# Patient Record
Sex: Female | Born: 1935 | Race: White | Hispanic: No | Smoking: Never smoker
Health system: Southern US, Community
[De-identification: ages and names within clinical notes are randomized; demographics above are authoritative.]

## PROBLEM LIST (undated history)

## (undated) DIAGNOSIS — M810 Age-related osteoporosis without current pathological fracture: Secondary | ICD-10-CM

## (undated) DIAGNOSIS — R1903 Right lower quadrant abdominal swelling, mass and lump: Secondary | ICD-10-CM

## (undated) DIAGNOSIS — I10 Essential (primary) hypertension: Secondary | ICD-10-CM

## (undated) DIAGNOSIS — D649 Anemia, unspecified: Secondary | ICD-10-CM

## (undated) DIAGNOSIS — B029 Zoster without complications: Secondary | ICD-10-CM

## (undated) DIAGNOSIS — H3530 Unspecified macular degeneration: Secondary | ICD-10-CM

## (undated) HISTORY — DX: Zoster without complications: B02.9

## (undated) HISTORY — DX: Right lower quadrant abdominal swelling, mass and lump: R19.03

## (undated) HISTORY — DX: Age-related osteoporosis without current pathological fracture: M81.0

## (undated) HISTORY — DX: Essential (primary) hypertension: I10

## (undated) HISTORY — DX: Unspecified macular degeneration: H35.30

## (undated) HISTORY — DX: Anemia, unspecified: D64.9

## (undated) HISTORY — PX: OTHER SURGICAL HISTORY: SHX169

---

## 1973-08-31 HISTORY — PX: TOTAL ABDOMINAL HYSTERECTOMY W/ BILATERAL SALPINGOOPHORECTOMY: SHX83

## 1998-01-10 ENCOUNTER — Ambulatory Visit (HOSPITAL_COMMUNITY): Admission: RE | Admit: 1998-01-10 | Discharge: 1998-01-10 | Payer: Self-pay | Admitting: Family Medicine

## 1998-07-22 ENCOUNTER — Other Ambulatory Visit: Admission: RE | Admit: 1998-07-22 | Discharge: 1998-07-22 | Payer: Self-pay | Admitting: Obstetrics and Gynecology

## 1999-10-03 ENCOUNTER — Encounter: Payer: Self-pay | Admitting: Family Medicine

## 1999-10-03 ENCOUNTER — Encounter: Admission: RE | Admit: 1999-10-03 | Discharge: 1999-10-03 | Payer: Self-pay | Admitting: Family Medicine

## 2000-03-23 ENCOUNTER — Encounter: Admission: RE | Admit: 2000-03-23 | Discharge: 2000-03-23 | Payer: Self-pay | Admitting: *Deleted

## 2000-11-09 ENCOUNTER — Encounter: Admission: RE | Admit: 2000-11-09 | Discharge: 2000-11-09 | Payer: Self-pay | Admitting: Family Medicine

## 2000-11-09 ENCOUNTER — Encounter: Payer: Self-pay | Admitting: Family Medicine

## 2000-11-11 ENCOUNTER — Encounter: Admission: RE | Admit: 2000-11-11 | Discharge: 2000-11-11 | Payer: Self-pay | Admitting: *Deleted

## 2001-05-12 ENCOUNTER — Other Ambulatory Visit: Admission: RE | Admit: 2001-05-12 | Discharge: 2001-05-12 | Payer: Self-pay | Admitting: *Deleted

## 2001-12-13 ENCOUNTER — Encounter: Admission: RE | Admit: 2001-12-13 | Discharge: 2001-12-13 | Payer: Self-pay | Admitting: *Deleted

## 2002-06-07 ENCOUNTER — Encounter: Admission: RE | Admit: 2002-06-07 | Discharge: 2002-06-07 | Payer: Self-pay | Admitting: Family Medicine

## 2002-06-07 ENCOUNTER — Encounter: Payer: Self-pay | Admitting: Family Medicine

## 2002-08-31 HISTORY — PX: OTHER SURGICAL HISTORY: SHX169

## 2002-09-19 ENCOUNTER — Ambulatory Visit: Admission: RE | Admit: 2002-09-19 | Discharge: 2002-09-19 | Payer: Self-pay | Admitting: Family Medicine

## 2002-09-19 ENCOUNTER — Encounter: Payer: Self-pay | Admitting: Family Medicine

## 2002-09-19 ENCOUNTER — Encounter: Admission: RE | Admit: 2002-09-19 | Discharge: 2002-09-19 | Payer: Self-pay | Admitting: Family Medicine

## 2003-02-09 ENCOUNTER — Encounter: Admission: RE | Admit: 2003-02-09 | Discharge: 2003-02-09 | Payer: Self-pay | Admitting: *Deleted

## 2004-03-07 ENCOUNTER — Encounter: Admission: RE | Admit: 2004-03-07 | Discharge: 2004-03-07 | Payer: Self-pay | Admitting: *Deleted

## 2005-04-14 ENCOUNTER — Encounter: Admission: RE | Admit: 2005-04-14 | Discharge: 2005-04-14 | Payer: Self-pay | Admitting: Family Medicine

## 2006-02-08 ENCOUNTER — Encounter: Payer: Self-pay | Admitting: Vascular Surgery

## 2006-02-08 ENCOUNTER — Ambulatory Visit (HOSPITAL_COMMUNITY): Admission: RE | Admit: 2006-02-08 | Discharge: 2006-02-08 | Payer: Self-pay | Admitting: Internal Medicine

## 2006-02-15 ENCOUNTER — Ambulatory Visit: Payer: Self-pay | Admitting: Gastroenterology

## 2006-03-16 ENCOUNTER — Encounter (INDEPENDENT_AMBULATORY_CARE_PROVIDER_SITE_OTHER): Payer: Self-pay | Admitting: Gastroenterology

## 2006-03-16 ENCOUNTER — Ambulatory Visit: Payer: Self-pay | Admitting: Gastroenterology

## 2006-06-01 ENCOUNTER — Encounter: Admission: RE | Admit: 2006-06-01 | Discharge: 2006-06-01 | Payer: Self-pay | Admitting: *Deleted

## 2007-06-07 ENCOUNTER — Encounter: Admission: RE | Admit: 2007-06-07 | Discharge: 2007-06-07 | Payer: Self-pay | Admitting: Internal Medicine

## 2008-06-07 ENCOUNTER — Encounter: Admission: RE | Admit: 2008-06-07 | Discharge: 2008-06-07 | Payer: Self-pay | Admitting: Internal Medicine

## 2008-12-24 ENCOUNTER — Emergency Department (HOSPITAL_COMMUNITY): Admission: AC | Admit: 2008-12-24 | Discharge: 2008-12-24 | Payer: Self-pay | Admitting: Emergency Medicine

## 2008-12-26 ENCOUNTER — Emergency Department (HOSPITAL_COMMUNITY): Admission: EM | Admit: 2008-12-26 | Discharge: 2008-12-26 | Payer: Self-pay | Admitting: Emergency Medicine

## 2009-06-11 ENCOUNTER — Encounter: Admission: RE | Admit: 2009-06-11 | Discharge: 2009-06-11 | Payer: Self-pay | Admitting: Internal Medicine

## 2010-04-23 ENCOUNTER — Ambulatory Visit (HOSPITAL_BASED_OUTPATIENT_CLINIC_OR_DEPARTMENT_OTHER): Admission: RE | Admit: 2010-04-23 | Discharge: 2010-04-23 | Payer: Self-pay | Admitting: Orthopedic Surgery

## 2010-06-17 ENCOUNTER — Encounter: Admission: RE | Admit: 2010-06-17 | Discharge: 2010-06-17 | Payer: Self-pay | Admitting: Internal Medicine

## 2010-11-14 LAB — POCT HEMOGLOBIN-HEMACUE: Hemoglobin: 10 g/dL — ABNORMAL LOW (ref 12.0–15.0)

## 2010-12-10 LAB — POCT I-STAT, CHEM 8
HCT: 33 % — ABNORMAL LOW (ref 36.0–46.0)
Hemoglobin: 11.2 g/dL — ABNORMAL LOW (ref 12.0–15.0)
Potassium: 3.3 mEq/L — ABNORMAL LOW (ref 3.5–5.1)
Sodium: 139 mEq/L (ref 135–145)

## 2010-12-10 LAB — DIFFERENTIAL
Basophils Absolute: 0.2 10*3/uL — ABNORMAL HIGH (ref 0.0–0.1)
Eosinophils Relative: 2 % (ref 0–5)
Lymphocytes Relative: 20 % (ref 12–46)
Lymphs Abs: 2 10*3/uL (ref 0.7–4.0)
Neutro Abs: 6.8 10*3/uL (ref 1.7–7.7)

## 2010-12-10 LAB — COMPREHENSIVE METABOLIC PANEL
AST: 40 U/L — ABNORMAL HIGH (ref 0–37)
Albumin: 3.2 g/dL — ABNORMAL LOW (ref 3.5–5.2)
BUN: 19 mg/dL (ref 6–23)
CO2: 23 mEq/L (ref 19–32)
Calcium: 8.3 mg/dL — ABNORMAL LOW (ref 8.4–10.5)
Chloride: 109 mEq/L (ref 96–112)
Creatinine, Ser: 0.62 mg/dL (ref 0.4–1.2)
GFR calc non Af Amer: >60 mL/min (ref >60)
Glucose, Bld: 116 mg/dL — ABNORMAL HIGH (ref 70–99)
Potassium: 3.7 mEq/L (ref 3.5–5.1)
Sodium: 140 mEq/L (ref 135–145)
Total Bilirubin: 0.4 mg/dL (ref 0.3–1.2)

## 2010-12-10 LAB — CBC
Hemoglobin: 10.4 g/dL — ABNORMAL LOW (ref 12.0–15.0)
RBC: 3.6 MIL/uL — ABNORMAL LOW (ref 3.87–5.11)
RDW: 14.8 % (ref 11.5–15.5)
WBC: 10 10*3/uL (ref 4.0–10.5)

## 2011-05-27 ENCOUNTER — Other Ambulatory Visit: Payer: Self-pay | Admitting: Internal Medicine

## 2011-05-27 DIAGNOSIS — Z1231 Encounter for screening mammogram for malignant neoplasm of breast: Secondary | ICD-10-CM

## 2011-06-22 ENCOUNTER — Ambulatory Visit: Payer: Self-pay

## 2011-06-29 ENCOUNTER — Ambulatory Visit
Admission: RE | Admit: 2011-06-29 | Discharge: 2011-06-29 | Disposition: A | Discharge status: Home / Self Care | Payer: Medicare Other | Source: Ambulatory Visit | Attending: Internal Medicine | Admitting: Internal Medicine

## 2011-06-29 DIAGNOSIS — Z1231 Encounter for screening mammogram for malignant neoplasm of breast: Secondary | ICD-10-CM

## 2011-09-22 DIAGNOSIS — H353290 Exudative age-related macular degeneration, unspecified eye, stage unspecified: Secondary | ICD-10-CM | POA: Diagnosis not present

## 2011-09-22 DIAGNOSIS — H35059 Retinal neovascularization, unspecified, unspecified eye: Secondary | ICD-10-CM | POA: Diagnosis not present

## 2011-09-24 DIAGNOSIS — M810 Age-related osteoporosis without current pathological fracture: Secondary | ICD-10-CM | POA: Diagnosis not present

## 2011-09-24 DIAGNOSIS — R82998 Other abnormal findings in urine: Secondary | ICD-10-CM | POA: Diagnosis not present

## 2011-09-24 DIAGNOSIS — E785 Hyperlipidemia, unspecified: Secondary | ICD-10-CM | POA: Diagnosis not present

## 2011-09-24 DIAGNOSIS — I10 Essential (primary) hypertension: Secondary | ICD-10-CM | POA: Diagnosis not present

## 2011-09-29 DIAGNOSIS — H353290 Exudative age-related macular degeneration, unspecified eye, stage unspecified: Secondary | ICD-10-CM | POA: Diagnosis not present

## 2011-09-29 DIAGNOSIS — H35059 Retinal neovascularization, unspecified, unspecified eye: Secondary | ICD-10-CM | POA: Diagnosis not present

## 2011-10-01 DIAGNOSIS — D649 Anemia, unspecified: Secondary | ICD-10-CM | POA: Diagnosis not present

## 2011-10-01 DIAGNOSIS — R252 Cramp and spasm: Secondary | ICD-10-CM | POA: Diagnosis not present

## 2011-10-01 DIAGNOSIS — Z0000 Encounter for general adult medical examination without abnormal findings: Secondary | ICD-10-CM | POA: Diagnosis not present

## 2011-10-01 DIAGNOSIS — I10 Essential (primary) hypertension: Secondary | ICD-10-CM | POA: Diagnosis not present

## 2011-10-05 DIAGNOSIS — Z1212 Encounter for screening for malignant neoplasm of rectum: Secondary | ICD-10-CM | POA: Diagnosis not present

## 2011-10-28 DIAGNOSIS — H35059 Retinal neovascularization, unspecified, unspecified eye: Secondary | ICD-10-CM | POA: Diagnosis not present

## 2011-10-28 DIAGNOSIS — H353290 Exudative age-related macular degeneration, unspecified eye, stage unspecified: Secondary | ICD-10-CM | POA: Diagnosis not present

## 2011-11-25 DIAGNOSIS — H353290 Exudative age-related macular degeneration, unspecified eye, stage unspecified: Secondary | ICD-10-CM | POA: Diagnosis not present

## 2011-11-25 DIAGNOSIS — H35059 Retinal neovascularization, unspecified, unspecified eye: Secondary | ICD-10-CM | POA: Diagnosis not present

## 2011-12-02 DIAGNOSIS — H35059 Retinal neovascularization, unspecified, unspecified eye: Secondary | ICD-10-CM | POA: Diagnosis not present

## 2011-12-02 DIAGNOSIS — H353290 Exudative age-related macular degeneration, unspecified eye, stage unspecified: Secondary | ICD-10-CM | POA: Diagnosis not present

## 2011-12-24 ENCOUNTER — Other Ambulatory Visit: Payer: Self-pay | Admitting: Dermatology

## 2011-12-24 DIAGNOSIS — D1801 Hemangioma of skin and subcutaneous tissue: Secondary | ICD-10-CM | POA: Diagnosis not present

## 2012-01-06 DIAGNOSIS — H353290 Exudative age-related macular degeneration, unspecified eye, stage unspecified: Secondary | ICD-10-CM | POA: Diagnosis not present

## 2012-01-06 DIAGNOSIS — H35059 Retinal neovascularization, unspecified, unspecified eye: Secondary | ICD-10-CM | POA: Diagnosis not present

## 2012-02-03 DIAGNOSIS — H35059 Retinal neovascularization, unspecified, unspecified eye: Secondary | ICD-10-CM | POA: Diagnosis not present

## 2012-02-03 DIAGNOSIS — H353290 Exudative age-related macular degeneration, unspecified eye, stage unspecified: Secondary | ICD-10-CM | POA: Diagnosis not present

## 2012-02-10 DIAGNOSIS — H353290 Exudative age-related macular degeneration, unspecified eye, stage unspecified: Secondary | ICD-10-CM | POA: Diagnosis not present

## 2012-02-10 DIAGNOSIS — H35059 Retinal neovascularization, unspecified, unspecified eye: Secondary | ICD-10-CM | POA: Diagnosis not present

## 2012-03-09 DIAGNOSIS — H353290 Exudative age-related macular degeneration, unspecified eye, stage unspecified: Secondary | ICD-10-CM | POA: Diagnosis not present

## 2012-03-09 DIAGNOSIS — H35059 Retinal neovascularization, unspecified, unspecified eye: Secondary | ICD-10-CM | POA: Diagnosis not present

## 2012-03-15 DIAGNOSIS — H35059 Retinal neovascularization, unspecified, unspecified eye: Secondary | ICD-10-CM | POA: Diagnosis not present

## 2012-03-15 DIAGNOSIS — H353290 Exudative age-related macular degeneration, unspecified eye, stage unspecified: Secondary | ICD-10-CM | POA: Diagnosis not present

## 2012-04-14 DIAGNOSIS — H353290 Exudative age-related macular degeneration, unspecified eye, stage unspecified: Secondary | ICD-10-CM | POA: Diagnosis not present

## 2012-04-14 DIAGNOSIS — H35059 Retinal neovascularization, unspecified, unspecified eye: Secondary | ICD-10-CM | POA: Diagnosis not present

## 2012-04-19 DIAGNOSIS — H35059 Retinal neovascularization, unspecified, unspecified eye: Secondary | ICD-10-CM | POA: Diagnosis not present

## 2012-04-19 DIAGNOSIS — H353290 Exudative age-related macular degeneration, unspecified eye, stage unspecified: Secondary | ICD-10-CM | POA: Diagnosis not present

## 2012-05-24 DIAGNOSIS — H35059 Retinal neovascularization, unspecified, unspecified eye: Secondary | ICD-10-CM | POA: Diagnosis not present

## 2012-05-24 DIAGNOSIS — H353290 Exudative age-related macular degeneration, unspecified eye, stage unspecified: Secondary | ICD-10-CM | POA: Diagnosis not present

## 2012-05-31 DIAGNOSIS — H353290 Exudative age-related macular degeneration, unspecified eye, stage unspecified: Secondary | ICD-10-CM | POA: Diagnosis not present

## 2012-05-31 DIAGNOSIS — H35059 Retinal neovascularization, unspecified, unspecified eye: Secondary | ICD-10-CM | POA: Diagnosis not present

## 2012-06-07 ENCOUNTER — Other Ambulatory Visit: Payer: Self-pay | Admitting: Internal Medicine

## 2012-06-07 DIAGNOSIS — Z1231 Encounter for screening mammogram for malignant neoplasm of breast: Secondary | ICD-10-CM

## 2012-06-23 DIAGNOSIS — H01009 Unspecified blepharitis unspecified eye, unspecified eyelid: Secondary | ICD-10-CM | POA: Diagnosis not present

## 2012-06-23 DIAGNOSIS — H3530 Unspecified macular degeneration: Secondary | ICD-10-CM | POA: Diagnosis not present

## 2012-06-23 DIAGNOSIS — H04129 Dry eye syndrome of unspecified lacrimal gland: Secondary | ICD-10-CM | POA: Diagnosis not present

## 2012-06-23 DIAGNOSIS — H52209 Unspecified astigmatism, unspecified eye: Secondary | ICD-10-CM | POA: Diagnosis not present

## 2012-06-28 DIAGNOSIS — H353290 Exudative age-related macular degeneration, unspecified eye, stage unspecified: Secondary | ICD-10-CM | POA: Diagnosis not present

## 2012-07-13 DIAGNOSIS — H35059 Retinal neovascularization, unspecified, unspecified eye: Secondary | ICD-10-CM | POA: Diagnosis not present

## 2012-07-13 DIAGNOSIS — H353290 Exudative age-related macular degeneration, unspecified eye, stage unspecified: Secondary | ICD-10-CM | POA: Diagnosis not present

## 2012-07-19 ENCOUNTER — Ambulatory Visit
Admission: RE | Admit: 2012-07-19 | Discharge: 2012-07-19 | Disposition: A | Discharge status: Home / Self Care | Payer: Medicare Other | Source: Ambulatory Visit | Attending: Internal Medicine | Admitting: Internal Medicine

## 2012-07-19 DIAGNOSIS — Z1231 Encounter for screening mammogram for malignant neoplasm of breast: Secondary | ICD-10-CM | POA: Diagnosis not present

## 2012-08-02 DIAGNOSIS — H353290 Exudative age-related macular degeneration, unspecified eye, stage unspecified: Secondary | ICD-10-CM | POA: Diagnosis not present

## 2012-08-16 DIAGNOSIS — H35059 Retinal neovascularization, unspecified, unspecified eye: Secondary | ICD-10-CM | POA: Diagnosis not present

## 2012-08-16 DIAGNOSIS — H353290 Exudative age-related macular degeneration, unspecified eye, stage unspecified: Secondary | ICD-10-CM | POA: Diagnosis not present

## 2012-09-21 DIAGNOSIS — H35359 Cystoid macular degeneration, unspecified eye: Secondary | ICD-10-CM | POA: Diagnosis not present

## 2012-09-21 DIAGNOSIS — H353290 Exudative age-related macular degeneration, unspecified eye, stage unspecified: Secondary | ICD-10-CM | POA: Diagnosis not present

## 2012-09-21 DIAGNOSIS — H35059 Retinal neovascularization, unspecified, unspecified eye: Secondary | ICD-10-CM | POA: Diagnosis not present

## 2012-09-27 DIAGNOSIS — H353290 Exudative age-related macular degeneration, unspecified eye, stage unspecified: Secondary | ICD-10-CM | POA: Diagnosis not present

## 2012-09-27 DIAGNOSIS — H35059 Retinal neovascularization, unspecified, unspecified eye: Secondary | ICD-10-CM | POA: Diagnosis not present

## 2012-09-30 DIAGNOSIS — E785 Hyperlipidemia, unspecified: Secondary | ICD-10-CM | POA: Diagnosis not present

## 2012-09-30 DIAGNOSIS — R82998 Other abnormal findings in urine: Secondary | ICD-10-CM | POA: Diagnosis not present

## 2012-09-30 DIAGNOSIS — I10 Essential (primary) hypertension: Secondary | ICD-10-CM | POA: Diagnosis not present

## 2012-09-30 DIAGNOSIS — M810 Age-related osteoporosis without current pathological fracture: Secondary | ICD-10-CM | POA: Diagnosis not present

## 2012-10-05 DIAGNOSIS — E785 Hyperlipidemia, unspecified: Secondary | ICD-10-CM | POA: Diagnosis not present

## 2012-10-05 DIAGNOSIS — Z0000 Encounter for general adult medical examination without abnormal findings: Secondary | ICD-10-CM | POA: Diagnosis not present

## 2012-10-05 DIAGNOSIS — I10 Essential (primary) hypertension: Secondary | ICD-10-CM | POA: Diagnosis not present

## 2012-10-05 DIAGNOSIS — Z1331 Encounter for screening for depression: Secondary | ICD-10-CM | POA: Diagnosis not present

## 2012-10-19 DIAGNOSIS — H6120 Impacted cerumen, unspecified ear: Secondary | ICD-10-CM | POA: Diagnosis not present

## 2012-10-19 DIAGNOSIS — H902 Conductive hearing loss, unspecified: Secondary | ICD-10-CM | POA: Diagnosis not present

## 2012-11-02 DIAGNOSIS — H35359 Cystoid macular degeneration, unspecified eye: Secondary | ICD-10-CM | POA: Diagnosis not present

## 2012-11-02 DIAGNOSIS — H353290 Exudative age-related macular degeneration, unspecified eye, stage unspecified: Secondary | ICD-10-CM | POA: Diagnosis not present

## 2012-11-29 DIAGNOSIS — H35059 Retinal neovascularization, unspecified, unspecified eye: Secondary | ICD-10-CM | POA: Diagnosis not present

## 2012-11-29 DIAGNOSIS — H353290 Exudative age-related macular degeneration, unspecified eye, stage unspecified: Secondary | ICD-10-CM | POA: Diagnosis not present

## 2012-12-21 DIAGNOSIS — H35059 Retinal neovascularization, unspecified, unspecified eye: Secondary | ICD-10-CM | POA: Diagnosis not present

## 2012-12-21 DIAGNOSIS — H353290 Exudative age-related macular degeneration, unspecified eye, stage unspecified: Secondary | ICD-10-CM | POA: Diagnosis not present

## 2013-01-03 DIAGNOSIS — H35059 Retinal neovascularization, unspecified, unspecified eye: Secondary | ICD-10-CM | POA: Diagnosis not present

## 2013-01-03 DIAGNOSIS — H353290 Exudative age-related macular degeneration, unspecified eye, stage unspecified: Secondary | ICD-10-CM | POA: Diagnosis not present

## 2013-01-25 DIAGNOSIS — H353290 Exudative age-related macular degeneration, unspecified eye, stage unspecified: Secondary | ICD-10-CM | POA: Diagnosis not present

## 2013-01-25 DIAGNOSIS — H35059 Retinal neovascularization, unspecified, unspecified eye: Secondary | ICD-10-CM | POA: Diagnosis not present

## 2013-02-20 DIAGNOSIS — H353290 Exudative age-related macular degeneration, unspecified eye, stage unspecified: Secondary | ICD-10-CM | POA: Diagnosis not present

## 2013-02-20 DIAGNOSIS — H35059 Retinal neovascularization, unspecified, unspecified eye: Secondary | ICD-10-CM | POA: Diagnosis not present

## 2013-03-01 DIAGNOSIS — H35059 Retinal neovascularization, unspecified, unspecified eye: Secondary | ICD-10-CM | POA: Diagnosis not present

## 2013-03-01 DIAGNOSIS — H353290 Exudative age-related macular degeneration, unspecified eye, stage unspecified: Secondary | ICD-10-CM | POA: Diagnosis not present

## 2013-04-04 DIAGNOSIS — H353290 Exudative age-related macular degeneration, unspecified eye, stage unspecified: Secondary | ICD-10-CM | POA: Diagnosis not present

## 2013-04-04 DIAGNOSIS — H35059 Retinal neovascularization, unspecified, unspecified eye: Secondary | ICD-10-CM | POA: Diagnosis not present

## 2013-04-04 DIAGNOSIS — H35359 Cystoid macular degeneration, unspecified eye: Secondary | ICD-10-CM | POA: Diagnosis not present

## 2013-04-05 DIAGNOSIS — H35059 Retinal neovascularization, unspecified, unspecified eye: Secondary | ICD-10-CM | POA: Diagnosis not present

## 2013-04-05 DIAGNOSIS — H353290 Exudative age-related macular degeneration, unspecified eye, stage unspecified: Secondary | ICD-10-CM | POA: Diagnosis not present

## 2013-05-10 DIAGNOSIS — H35359 Cystoid macular degeneration, unspecified eye: Secondary | ICD-10-CM | POA: Diagnosis not present

## 2013-05-10 DIAGNOSIS — H353290 Exudative age-related macular degeneration, unspecified eye, stage unspecified: Secondary | ICD-10-CM | POA: Diagnosis not present

## 2013-05-16 DIAGNOSIS — H353290 Exudative age-related macular degeneration, unspecified eye, stage unspecified: Secondary | ICD-10-CM | POA: Diagnosis not present

## 2013-05-16 DIAGNOSIS — H35059 Retinal neovascularization, unspecified, unspecified eye: Secondary | ICD-10-CM | POA: Diagnosis not present

## 2013-06-21 DIAGNOSIS — H353290 Exudative age-related macular degeneration, unspecified eye, stage unspecified: Secondary | ICD-10-CM | POA: Diagnosis not present

## 2013-06-21 DIAGNOSIS — H35059 Retinal neovascularization, unspecified, unspecified eye: Secondary | ICD-10-CM | POA: Diagnosis not present

## 2013-06-27 DIAGNOSIS — H52209 Unspecified astigmatism, unspecified eye: Secondary | ICD-10-CM | POA: Diagnosis not present

## 2013-06-27 DIAGNOSIS — Z961 Presence of intraocular lens: Secondary | ICD-10-CM | POA: Diagnosis not present

## 2013-06-27 DIAGNOSIS — H3530 Unspecified macular degeneration: Secondary | ICD-10-CM | POA: Diagnosis not present

## 2013-06-27 DIAGNOSIS — H01009 Unspecified blepharitis unspecified eye, unspecified eyelid: Secondary | ICD-10-CM | POA: Diagnosis not present

## 2013-06-28 ENCOUNTER — Other Ambulatory Visit: Payer: Self-pay

## 2013-06-28 DIAGNOSIS — Z1231 Encounter for screening mammogram for malignant neoplasm of breast: Secondary | ICD-10-CM

## 2013-07-04 DIAGNOSIS — H35359 Cystoid macular degeneration, unspecified eye: Secondary | ICD-10-CM | POA: Diagnosis not present

## 2013-07-04 DIAGNOSIS — H353290 Exudative age-related macular degeneration, unspecified eye, stage unspecified: Secondary | ICD-10-CM | POA: Diagnosis not present

## 2013-08-01 ENCOUNTER — Ambulatory Visit
Admission: RE | Admit: 2013-08-01 | Discharge: 2013-08-01 | Disposition: A | Discharge status: Home / Self Care | Payer: Medicare Other | Source: Ambulatory Visit

## 2013-08-01 DIAGNOSIS — Z1231 Encounter for screening mammogram for malignant neoplasm of breast: Secondary | ICD-10-CM

## 2013-08-07 DIAGNOSIS — H353290 Exudative age-related macular degeneration, unspecified eye, stage unspecified: Secondary | ICD-10-CM | POA: Diagnosis not present

## 2013-08-07 DIAGNOSIS — H35059 Retinal neovascularization, unspecified, unspecified eye: Secondary | ICD-10-CM | POA: Diagnosis not present

## 2013-08-29 DIAGNOSIS — H353190 Nonexudative age-related macular degeneration, unspecified eye, stage unspecified: Secondary | ICD-10-CM | POA: Diagnosis not present

## 2013-08-29 DIAGNOSIS — H353290 Exudative age-related macular degeneration, unspecified eye, stage unspecified: Secondary | ICD-10-CM | POA: Diagnosis not present

## 2013-09-13 DIAGNOSIS — H353290 Exudative age-related macular degeneration, unspecified eye, stage unspecified: Secondary | ICD-10-CM | POA: Diagnosis not present

## 2013-09-13 DIAGNOSIS — H35059 Retinal neovascularization, unspecified, unspecified eye: Secondary | ICD-10-CM | POA: Diagnosis not present

## 2013-10-04 DIAGNOSIS — E785 Hyperlipidemia, unspecified: Secondary | ICD-10-CM | POA: Diagnosis not present

## 2013-10-04 DIAGNOSIS — M810 Age-related osteoporosis without current pathological fracture: Secondary | ICD-10-CM | POA: Diagnosis not present

## 2013-10-04 DIAGNOSIS — R82998 Other abnormal findings in urine: Secondary | ICD-10-CM | POA: Diagnosis not present

## 2013-10-04 DIAGNOSIS — I10 Essential (primary) hypertension: Secondary | ICD-10-CM | POA: Diagnosis not present

## 2013-10-10 DIAGNOSIS — H35059 Retinal neovascularization, unspecified, unspecified eye: Secondary | ICD-10-CM | POA: Diagnosis not present

## 2013-10-10 DIAGNOSIS — H353290 Exudative age-related macular degeneration, unspecified eye, stage unspecified: Secondary | ICD-10-CM | POA: Diagnosis not present

## 2013-10-11 DIAGNOSIS — M810 Age-related osteoporosis without current pathological fracture: Secondary | ICD-10-CM | POA: Diagnosis not present

## 2013-10-11 DIAGNOSIS — Z1331 Encounter for screening for depression: Secondary | ICD-10-CM | POA: Diagnosis not present

## 2013-10-11 DIAGNOSIS — I10 Essential (primary) hypertension: Secondary | ICD-10-CM | POA: Diagnosis not present

## 2013-10-11 DIAGNOSIS — H3530 Unspecified macular degeneration: Secondary | ICD-10-CM | POA: Diagnosis not present

## 2013-10-11 DIAGNOSIS — M1990 Unspecified osteoarthritis, unspecified site: Secondary | ICD-10-CM | POA: Diagnosis not present

## 2013-10-11 DIAGNOSIS — H6120 Impacted cerumen, unspecified ear: Secondary | ICD-10-CM | POA: Diagnosis not present

## 2013-10-11 DIAGNOSIS — Z0000 Encounter for general adult medical examination without abnormal findings: Secondary | ICD-10-CM | POA: Diagnosis not present

## 2013-10-11 DIAGNOSIS — B029 Zoster without complications: Secondary | ICD-10-CM | POA: Diagnosis not present

## 2013-10-11 DIAGNOSIS — D649 Anemia, unspecified: Secondary | ICD-10-CM | POA: Diagnosis not present

## 2013-10-12 DIAGNOSIS — H902 Conductive hearing loss, unspecified: Secondary | ICD-10-CM | POA: Diagnosis not present

## 2013-10-12 DIAGNOSIS — H6120 Impacted cerumen, unspecified ear: Secondary | ICD-10-CM | POA: Diagnosis not present

## 2013-10-13 DIAGNOSIS — Z1212 Encounter for screening for malignant neoplasm of rectum: Secondary | ICD-10-CM | POA: Diagnosis not present

## 2013-10-16 DIAGNOSIS — M19049 Primary osteoarthritis, unspecified hand: Secondary | ICD-10-CM | POA: Diagnosis not present

## 2013-10-16 DIAGNOSIS — I10 Essential (primary) hypertension: Secondary | ICD-10-CM | POA: Diagnosis not present

## 2013-10-16 DIAGNOSIS — S6990XA Unspecified injury of unspecified wrist, hand and finger(s), initial encounter: Secondary | ICD-10-CM | POA: Diagnosis not present

## 2013-10-16 DIAGNOSIS — Z6825 Body mass index (BMI) 25.0-25.9, adult: Secondary | ICD-10-CM | POA: Diagnosis not present

## 2013-10-16 DIAGNOSIS — IMO0002 Reserved for concepts with insufficient information to code with codable children: Secondary | ICD-10-CM | POA: Diagnosis not present

## 2013-10-16 DIAGNOSIS — S6980XA Other specified injuries of unspecified wrist, hand and finger(s), initial encounter: Secondary | ICD-10-CM | POA: Diagnosis not present

## 2013-10-19 DIAGNOSIS — H35059 Retinal neovascularization, unspecified, unspecified eye: Secondary | ICD-10-CM | POA: Diagnosis not present

## 2013-10-19 DIAGNOSIS — H353290 Exudative age-related macular degeneration, unspecified eye, stage unspecified: Secondary | ICD-10-CM | POA: Diagnosis not present

## 2013-10-31 DIAGNOSIS — Z79899 Other long term (current) drug therapy: Secondary | ICD-10-CM | POA: Diagnosis not present

## 2013-10-31 DIAGNOSIS — M810 Age-related osteoporosis without current pathological fracture: Secondary | ICD-10-CM | POA: Diagnosis not present

## 2013-11-02 DIAGNOSIS — M20019 Mallet finger of unspecified finger(s): Secondary | ICD-10-CM | POA: Diagnosis not present

## 2013-11-21 DIAGNOSIS — H35059 Retinal neovascularization, unspecified, unspecified eye: Secondary | ICD-10-CM | POA: Diagnosis not present

## 2013-11-21 DIAGNOSIS — H353290 Exudative age-related macular degeneration, unspecified eye, stage unspecified: Secondary | ICD-10-CM | POA: Diagnosis not present

## 2013-11-22 DIAGNOSIS — H35059 Retinal neovascularization, unspecified, unspecified eye: Secondary | ICD-10-CM | POA: Diagnosis not present

## 2013-11-22 DIAGNOSIS — H353290 Exudative age-related macular degeneration, unspecified eye, stage unspecified: Secondary | ICD-10-CM | POA: Diagnosis not present

## 2013-11-23 DIAGNOSIS — M20019 Mallet finger of unspecified finger(s): Secondary | ICD-10-CM | POA: Diagnosis not present

## 2013-11-30 DIAGNOSIS — M20019 Mallet finger of unspecified finger(s): Secondary | ICD-10-CM | POA: Diagnosis not present

## 2013-12-19 DIAGNOSIS — M20019 Mallet finger of unspecified finger(s): Secondary | ICD-10-CM | POA: Diagnosis not present

## 2013-12-26 DIAGNOSIS — H353290 Exudative age-related macular degeneration, unspecified eye, stage unspecified: Secondary | ICD-10-CM | POA: Diagnosis not present

## 2013-12-26 DIAGNOSIS — H35059 Retinal neovascularization, unspecified, unspecified eye: Secondary | ICD-10-CM | POA: Diagnosis not present

## 2014-01-03 DIAGNOSIS — H353290 Exudative age-related macular degeneration, unspecified eye, stage unspecified: Secondary | ICD-10-CM | POA: Diagnosis not present

## 2014-01-03 DIAGNOSIS — H35059 Retinal neovascularization, unspecified, unspecified eye: Secondary | ICD-10-CM | POA: Diagnosis not present

## 2014-01-30 DIAGNOSIS — H35359 Cystoid macular degeneration, unspecified eye: Secondary | ICD-10-CM | POA: Diagnosis not present

## 2014-01-30 DIAGNOSIS — H35059 Retinal neovascularization, unspecified, unspecified eye: Secondary | ICD-10-CM | POA: Diagnosis not present

## 2014-01-30 DIAGNOSIS — H353290 Exudative age-related macular degeneration, unspecified eye, stage unspecified: Secondary | ICD-10-CM | POA: Diagnosis not present

## 2014-02-12 ENCOUNTER — Other Ambulatory Visit (HOSPITAL_COMMUNITY): Payer: Self-pay | Admitting: Internal Medicine

## 2014-02-12 DIAGNOSIS — H3530 Unspecified macular degeneration: Secondary | ICD-10-CM | POA: Diagnosis not present

## 2014-02-12 DIAGNOSIS — I10 Essential (primary) hypertension: Secondary | ICD-10-CM | POA: Diagnosis not present

## 2014-02-12 DIAGNOSIS — R1903 Right lower quadrant abdominal swelling, mass and lump: Secondary | ICD-10-CM | POA: Diagnosis not present

## 2014-02-12 DIAGNOSIS — Z6825 Body mass index (BMI) 25.0-25.9, adult: Secondary | ICD-10-CM | POA: Diagnosis not present

## 2014-02-13 DIAGNOSIS — H35059 Retinal neovascularization, unspecified, unspecified eye: Secondary | ICD-10-CM | POA: Diagnosis not present

## 2014-02-13 DIAGNOSIS — H353290 Exudative age-related macular degeneration, unspecified eye, stage unspecified: Secondary | ICD-10-CM | POA: Diagnosis not present

## 2014-02-13 DIAGNOSIS — H35359 Cystoid macular degeneration, unspecified eye: Secondary | ICD-10-CM | POA: Diagnosis not present

## 2014-02-16 ENCOUNTER — Ambulatory Visit (HOSPITAL_COMMUNITY)
Admission: RE | Admit: 2014-02-16 | Discharge: 2014-02-16 | Disposition: A | Discharge status: Home / Self Care | Payer: Medicare Other | Source: Ambulatory Visit | Attending: Internal Medicine | Admitting: Internal Medicine

## 2014-02-16 DIAGNOSIS — K4090 Unilateral inguinal hernia, without obstruction or gangrene, not specified as recurrent: Secondary | ICD-10-CM | POA: Diagnosis not present

## 2014-02-16 DIAGNOSIS — R1903 Right lower quadrant abdominal swelling, mass and lump: Secondary | ICD-10-CM

## 2014-02-16 DIAGNOSIS — R1900 Intra-abdominal and pelvic swelling, mass and lump, unspecified site: Secondary | ICD-10-CM | POA: Insufficient documentation

## 2014-03-20 DIAGNOSIS — H353290 Exudative age-related macular degeneration, unspecified eye, stage unspecified: Secondary | ICD-10-CM | POA: Diagnosis not present

## 2014-03-20 DIAGNOSIS — H35059 Retinal neovascularization, unspecified, unspecified eye: Secondary | ICD-10-CM | POA: Diagnosis not present

## 2014-03-21 DIAGNOSIS — H353290 Exudative age-related macular degeneration, unspecified eye, stage unspecified: Secondary | ICD-10-CM | POA: Diagnosis not present

## 2014-03-21 DIAGNOSIS — H35059 Retinal neovascularization, unspecified, unspecified eye: Secondary | ICD-10-CM | POA: Diagnosis not present

## 2014-04-25 DIAGNOSIS — H35059 Retinal neovascularization, unspecified, unspecified eye: Secondary | ICD-10-CM | POA: Diagnosis not present

## 2014-04-25 DIAGNOSIS — H353290 Exudative age-related macular degeneration, unspecified eye, stage unspecified: Secondary | ICD-10-CM | POA: Diagnosis not present

## 2014-05-01 DIAGNOSIS — H35059 Retinal neovascularization, unspecified, unspecified eye: Secondary | ICD-10-CM | POA: Diagnosis not present

## 2014-05-01 DIAGNOSIS — H353290 Exudative age-related macular degeneration, unspecified eye, stage unspecified: Secondary | ICD-10-CM | POA: Diagnosis not present

## 2014-05-30 DIAGNOSIS — H35059 Retinal neovascularization, unspecified, unspecified eye: Secondary | ICD-10-CM | POA: Diagnosis not present

## 2014-05-30 DIAGNOSIS — H35359 Cystoid macular degeneration, unspecified eye: Secondary | ICD-10-CM | POA: Diagnosis not present

## 2014-05-30 DIAGNOSIS — H353290 Exudative age-related macular degeneration, unspecified eye, stage unspecified: Secondary | ICD-10-CM | POA: Diagnosis not present

## 2014-06-12 DIAGNOSIS — H3532 Exudative age-related macular degeneration: Secondary | ICD-10-CM | POA: Diagnosis not present

## 2014-06-12 DIAGNOSIS — H35363 Drusen (degenerative) of macula, bilateral: Secondary | ICD-10-CM | POA: Diagnosis not present

## 2014-06-12 DIAGNOSIS — H35352 Cystoid macular degeneration, left eye: Secondary | ICD-10-CM | POA: Diagnosis not present

## 2014-06-27 ENCOUNTER — Other Ambulatory Visit: Payer: Self-pay

## 2014-06-27 DIAGNOSIS — Z1231 Encounter for screening mammogram for malignant neoplasm of breast: Secondary | ICD-10-CM

## 2014-06-29 DIAGNOSIS — H01004 Unspecified blepharitis left upper eyelid: Secondary | ICD-10-CM | POA: Diagnosis not present

## 2014-06-29 DIAGNOSIS — Z961 Presence of intraocular lens: Secondary | ICD-10-CM | POA: Diagnosis not present

## 2014-06-29 DIAGNOSIS — H01001 Unspecified blepharitis right upper eyelid: Secondary | ICD-10-CM | POA: Diagnosis not present

## 2014-06-29 DIAGNOSIS — H43813 Vitreous degeneration, bilateral: Secondary | ICD-10-CM | POA: Diagnosis not present

## 2014-07-04 DIAGNOSIS — H35051 Retinal neovascularization, unspecified, right eye: Secondary | ICD-10-CM | POA: Diagnosis not present

## 2014-07-04 DIAGNOSIS — H3532 Exudative age-related macular degeneration: Secondary | ICD-10-CM | POA: Diagnosis not present

## 2014-07-18 DIAGNOSIS — H35352 Cystoid macular degeneration, left eye: Secondary | ICD-10-CM | POA: Diagnosis not present

## 2014-07-18 DIAGNOSIS — H3532 Exudative age-related macular degeneration: Secondary | ICD-10-CM | POA: Diagnosis not present

## 2014-08-07 ENCOUNTER — Ambulatory Visit
Admission: RE | Admit: 2014-08-07 | Discharge: 2014-08-07 | Disposition: A | Discharge status: Home / Self Care | Payer: Medicare Other | Source: Ambulatory Visit

## 2014-08-07 DIAGNOSIS — Z1231 Encounter for screening mammogram for malignant neoplasm of breast: Secondary | ICD-10-CM | POA: Diagnosis not present

## 2014-08-09 DIAGNOSIS — H3532 Exudative age-related macular degeneration: Secondary | ICD-10-CM | POA: Diagnosis not present

## 2014-08-09 DIAGNOSIS — H35051 Retinal neovascularization, unspecified, right eye: Secondary | ICD-10-CM | POA: Diagnosis not present

## 2014-08-10 DIAGNOSIS — R5383 Other fatigue: Secondary | ICD-10-CM | POA: Diagnosis not present

## 2014-08-28 DIAGNOSIS — H35052 Retinal neovascularization, unspecified, left eye: Secondary | ICD-10-CM | POA: Diagnosis not present

## 2014-08-28 DIAGNOSIS — H3532 Exudative age-related macular degeneration: Secondary | ICD-10-CM | POA: Diagnosis not present

## 2014-09-25 DIAGNOSIS — H35052 Retinal neovascularization, unspecified, left eye: Secondary | ICD-10-CM | POA: Diagnosis not present

## 2014-09-25 DIAGNOSIS — H3532 Exudative age-related macular degeneration: Secondary | ICD-10-CM | POA: Diagnosis not present

## 2014-10-09 DIAGNOSIS — H35052 Retinal neovascularization, unspecified, left eye: Secondary | ICD-10-CM | POA: Diagnosis not present

## 2014-10-09 DIAGNOSIS — H3532 Exudative age-related macular degeneration: Secondary | ICD-10-CM | POA: Diagnosis not present

## 2014-10-30 DIAGNOSIS — H35051 Retinal neovascularization, unspecified, right eye: Secondary | ICD-10-CM | POA: Diagnosis not present

## 2014-10-30 DIAGNOSIS — H3532 Exudative age-related macular degeneration: Secondary | ICD-10-CM | POA: Diagnosis not present

## 2014-10-31 DIAGNOSIS — I10 Essential (primary) hypertension: Secondary | ICD-10-CM | POA: Diagnosis not present

## 2014-10-31 DIAGNOSIS — Z0000 Encounter for general adult medical examination without abnormal findings: Secondary | ICD-10-CM | POA: Diagnosis not present

## 2014-10-31 DIAGNOSIS — E785 Hyperlipidemia, unspecified: Secondary | ICD-10-CM | POA: Diagnosis not present

## 2014-10-31 DIAGNOSIS — M810 Age-related osteoporosis without current pathological fracture: Secondary | ICD-10-CM | POA: Diagnosis not present

## 2014-11-07 DIAGNOSIS — Z79899 Other long term (current) drug therapy: Secondary | ICD-10-CM | POA: Diagnosis not present

## 2014-11-07 DIAGNOSIS — Z6826 Body mass index (BMI) 26.0-26.9, adult: Secondary | ICD-10-CM | POA: Diagnosis not present

## 2014-11-07 DIAGNOSIS — D649 Anemia, unspecified: Secondary | ICD-10-CM | POA: Diagnosis not present

## 2014-11-07 DIAGNOSIS — R1903 Right lower quadrant abdominal swelling, mass and lump: Secondary | ICD-10-CM | POA: Diagnosis not present

## 2014-11-07 DIAGNOSIS — M810 Age-related osteoporosis without current pathological fracture: Secondary | ICD-10-CM | POA: Diagnosis not present

## 2014-11-07 DIAGNOSIS — K279 Peptic ulcer, site unspecified, unspecified as acute or chronic, without hemorrhage or perforation: Secondary | ICD-10-CM | POA: Diagnosis not present

## 2014-11-07 DIAGNOSIS — Z0000 Encounter for general adult medical examination without abnormal findings: Secondary | ICD-10-CM | POA: Diagnosis not present

## 2014-11-07 DIAGNOSIS — Z1389 Encounter for screening for other disorder: Secondary | ICD-10-CM | POA: Diagnosis not present

## 2014-11-07 DIAGNOSIS — I10 Essential (primary) hypertension: Secondary | ICD-10-CM | POA: Diagnosis not present

## 2014-11-07 DIAGNOSIS — H3530 Unspecified macular degeneration: Secondary | ICD-10-CM | POA: Diagnosis not present

## 2014-11-07 DIAGNOSIS — R5383 Other fatigue: Secondary | ICD-10-CM | POA: Diagnosis not present

## 2014-11-07 DIAGNOSIS — N390 Urinary tract infection, site not specified: Secondary | ICD-10-CM | POA: Diagnosis not present

## 2014-11-13 DIAGNOSIS — H3532 Exudative age-related macular degeneration: Secondary | ICD-10-CM | POA: Diagnosis not present

## 2014-12-04 DIAGNOSIS — H3532 Exudative age-related macular degeneration: Secondary | ICD-10-CM | POA: Diagnosis not present

## 2014-12-18 DIAGNOSIS — Z6826 Body mass index (BMI) 26.0-26.9, adult: Secondary | ICD-10-CM | POA: Diagnosis not present

## 2014-12-18 DIAGNOSIS — M810 Age-related osteoporosis without current pathological fracture: Secondary | ICD-10-CM | POA: Diagnosis not present

## 2014-12-25 DIAGNOSIS — H3532 Exudative age-related macular degeneration: Secondary | ICD-10-CM | POA: Diagnosis not present

## 2015-01-03 ENCOUNTER — Other Ambulatory Visit (HOSPITAL_COMMUNITY): Payer: Self-pay | Admitting: *Deleted

## 2015-01-04 ENCOUNTER — Encounter (HOSPITAL_COMMUNITY)
Admission: RE | Admit: 2015-01-04 | Discharge: 2015-01-04 | Disposition: A | Discharge status: Home / Self Care | Payer: Medicare Other | Source: Ambulatory Visit | Attending: Internal Medicine | Admitting: Internal Medicine

## 2015-01-04 DIAGNOSIS — M810 Age-related osteoporosis without current pathological fracture: Secondary | ICD-10-CM | POA: Diagnosis not present

## 2015-01-04 MED ORDER — DENOSUMAB 60 MG/ML ~~LOC~~ SOLN
60.0000 mg | Freq: Once | SUBCUTANEOUS | Status: AC
  Administered 2015-01-04: 60 mg via SUBCUTANEOUS
  Filled 2015-01-04: qty 1

## 2015-01-04 NOTE — Discharge Instructions (Signed)
Denosumab injection What is this medicine? DENOSUMAB (den oh sue mab) slows bone breakdown. Prolia is used to treat osteoporosis in women after menopause and in men. Xgeva is used to prevent bone fractures and other bone problems caused by cancer bone metastases. Xgeva is also used to treat giant cell tumor of the bone. This medicine may be used for other purposes; ask your health care provider or pharmacist if you have questions. COMMON BRAND NAME(S): Prolia, XGEVA What should I tell my health care provider before I take this medicine? They need to know if you have any of these conditions: -dental disease -eczema -infection or history of infections -kidney disease or on dialysis -low blood calcium or vitamin D -malabsorption syndrome -scheduled to have surgery or tooth extraction -taking medicine that contains denosumab -thyroid or parathyroid disease -an unusual reaction to denosumab, other medicines, foods, dyes, or preservatives -pregnant or trying to get pregnant -breast-feeding How should I use this medicine? This medicine is for injection under the skin. It is given by a health care professional in a hospital or clinic setting. If you are getting Prolia, a special MedGuide will be given to you by the pharmacist with each prescription and refill. Be sure to read this information carefully each time. For Prolia, talk to your pediatrician regarding the use of this medicine in children. Special care may be needed. For Xgeva, talk to your pediatrician regarding the use of this medicine in children. While this drug may be prescribed for children as young as 13 years for selected conditions, precautions do apply. Overdosage: If you think you've taken too much of this medicine contact a poison control center or emergency room at once. Overdosage: If you think you have taken too much of this medicine contact a poison control center or emergency room at once. NOTE: This medicine is only for  you. Do not share this medicine with others. What if I miss a dose? It is important not to miss your dose. Call your doctor or health care professional if you are unable to keep an appointment. What may interact with this medicine? Do not take this medicine with any of the following medications: -other medicines containing denosumab This medicine may also interact with the following medications: -medicines that suppress the immune system -medicines that treat cancer -steroid medicines like prednisone or cortisone This list may not describe all possible interactions. Give your health care provider a list of all the medicines, herbs, non-prescription drugs, or dietary supplements you use. Also tell them if you smoke, drink alcohol, or use illegal drugs. Some items may interact with your medicine. What should I watch for while using this medicine? Visit your doctor or health care professional for regular checks on your progress. Your doctor or health care professional may order blood tests and other tests to see how you are doing. Call your doctor or health care professional if you get a cold or other infection while receiving this medicine. Do not treat yourself. This medicine may decrease your body's ability to fight infection. You should make sure you get enough calcium and vitamin D while you are taking this medicine, unless your doctor tells you not to. Discuss the foods you eat and the vitamins you take with your health care professional. See your dentist regularly. Brush and floss your teeth as directed. Before you have any dental work done, tell your dentist you are receiving this medicine. Do not become pregnant while taking this medicine or for 5 months after stopping   it. Women should inform their doctor if they wish to become pregnant or think they might be pregnant. There is a potential for serious side effects to an unborn child. Talk to your health care professional or pharmacist for more  information. What side effects may I notice from receiving this medicine? Side effects that you should report to your doctor or health care professional as soon as possible: -allergic reactions like skin rash, itching or hives, swelling of the face, lips, or tongue -breathing problems -chest pain -fast, irregular heartbeat -feeling faint or lightheaded, falls -fever, chills, or any other sign of infection -muscle spasms, tightening, or twitches -numbness or tingling -skin blisters or bumps, or is dry, peels, or red -slow healing or unexplained pain in the mouth or jaw -unusual bleeding or bruising Side effects that usually do not require medical attention (Report these to your doctor or health care professional if they continue or are bothersome.): -muscle pain -stomach upset, gas This list may not describe all possible side effects. Call your doctor for medical advice about side effects. You may report side effects to FDA at 1-800-FDA-1088. Where should I keep my medicine? This medicine is only given in a clinic, doctor's office, or other health care setting and will not be stored at home. NOTE: This sheet is a summary. It may not cover all possible information. If you have questions about this medicine, talk to your doctor, pharmacist, or health care provider.  2015, Elsevier/Gold Standard. (2012-02-15 12:37:47)  

## 2015-01-08 ENCOUNTER — Encounter: Payer: Self-pay | Admitting: Rehabilitative and Restorative Service Providers"

## 2015-01-08 ENCOUNTER — Ambulatory Visit: Payer: Medicare Other | Attending: Internal Medicine | Admitting: Rehabilitative and Restorative Service Providers"

## 2015-01-08 DIAGNOSIS — R296 Repeated falls: Secondary | ICD-10-CM | POA: Insufficient documentation

## 2015-01-08 DIAGNOSIS — R531 Weakness: Secondary | ICD-10-CM | POA: Insufficient documentation

## 2015-01-08 DIAGNOSIS — Z7409 Other reduced mobility: Secondary | ICD-10-CM | POA: Diagnosis not present

## 2015-01-08 DIAGNOSIS — R2689 Other abnormalities of gait and mobility: Secondary | ICD-10-CM | POA: Diagnosis not present

## 2015-01-08 DIAGNOSIS — R269 Unspecified abnormalities of gait and mobility: Secondary | ICD-10-CM | POA: Insufficient documentation

## 2015-01-08 NOTE — Therapy (Signed)
[facility] [address] [place], [place], [address] Phone: [phone]   Fax:  [phone]  Physical Therapy Evaluation  Patient Details  Name: Dawn Hampton MRN: [id number] Date of Birth: 1921-09-20 Referring Provider:  Leanna Battles, MD  Encounter Date: 01/08/2015      PT End of Session - 01/08/15 1434    Visit Number 1   Number of Visits 16   Date for PT Re-Evaluation 03/11/15   Authorization Type G code every 10th visit    PT Start Time 1240   PT Stop Time 1324   PT Time Calculation (min) 44 min   Equipment Utilized During Treatment Gait belt   Activity Tolerance Patient tolerated treatment well   Behavior During Therapy [facility] for tasks assessed/performed      Past Medical History  Diagnosis Date  . Osteoporosis   . Hypertension   . Macular degeneration   . Anemia   . Abdominal mass, right lower quadrant   . Shingles     T8    Past Surgical History  Procedure Laterality Date  . Total abdominal hysterectomy w/ bilateral salpingoophorectomy  1975  . Pud, partial gastrectomy 1992    . Cataracts  2004    There were no vitals filed for this visit.  Visit Diagnosis:  Frequent falls  Weakness generalized  Abnormality of gait  Poor balance  Decreased mobility and endurance      Subjective Assessment - 01/08/15 1413    Subjective pt drove herself independently to clinic today, and used no AD to ambulate into clinic. pt referred to PT due to her report of frequent falls. pt states she cannot recall when her last fall was, but she believes it was this past December when she reached down to pick up  her mail and lost her balance and ended up on the floor. pt believes her fall was due to an earwax removal procedure that was performed earlier that day. pt complains that she fears falling in shower so she resorts to using a sponge bath at home for hygiene. pt also complains of being easily fatigued  when ambulating throughout community or running errands.    Patient Stated Goals improve balance, decrease falls, improve endurance so she can continue shopping with friends   Currently in Pain? No/denies              PT Education - 01/08/15 1433    Education provided Yes   Education Details pt educated on how physical therapy can help increase strength and improve balance to help decrease the risk for falls    Person(s) Educated Patient   Methods Explanation   Comprehension Verbalized understanding          PT Short Term Goals - 01/08/15 1503    PT SHORT TERM GOAL #1   Title The patient will be indep with [place] HEP (evidence based fall prevention) with written handouts.   Time 4   Period Weeks   Status New   PT SHORT TERM GOAL #2   Title The patient will increase walking speed from 2.46 ft/sec up to 2.15ft/sec to demo decreased risk for falls    Time 4   Period Weeks   Status New   PT SHORT TERM GOAL #3   Title The patient will improve Berg from 41/56 up to 45/56 to demo decreasing risk for falls   Time 4   Period Weeks   Status New   PT SHORT TERM  GOAL #4   Title Pt will improve TUG score to less than or equal to 19 seconds for decreased fall risk.    Time 8   Period Weeks   Status New           PT Long Term Goals - 01-28-15 1457    PT LONG TERM GOAL #1   Title The patient will be indep with progression of HEP for post d/c.    Time 8   Period Weeks   Status New   PT LONG TERM GOAL #2   Title The patient will improve Berg score from 41/56 up to 47/56 to demo decreasing risk for falls.   Time 8   Period Weeks   Status New   PT LONG TERM GOAL #3   Title Pt will improve TUG score to less than or equal to 17 seconds for decreased fall risk.    Baseline 20.43 seconds   Time 8   Period Weeks   Status New   PT LONG TERM GOAL #4   Title The patient will improve gait speed from 2.46 ft/sec up to 3.0 ft/sec to demo transition to "full community ambulator"  classification of gait.   Time 8   Period Weeks   Status New               Plan - 28-Jan-2015 1437    Clinical Impression Statement pt is a 81 y.o.female presenting to outpatient neuro physical therapy due to her history of falls. pt has difficulty recalling her falls, but does remember one recent fall this past December in which she had an anterior LOB when bending forward to pick up mail from the floor.  pt's fear of falling and fatigue is currently is influencing her daily activities. Pt's gait speed, poor balance, and TUG score indicate she is at a high risk for falls.    Pt will benefit from skilled therapeutic intervention in order to improve on the following deficits Abnormal gait;Decreased balance;Decreased activity tolerance;Decreased mobility;Decreased endurance;Decreased strength;Postural dysfunction;Impaired flexibility;Difficulty walking;Decreased coordination   Rehab Potential Good   PT Frequency 2x / week   PT Duration 8 weeks   PT Treatment/Interventions Gait training;Balance training;Neuromuscular re-education;Stair training;Functional mobility training;Patient/family education;Therapeutic activities;ADLs/Self Care Home Management   PT Next Visit Plan establish HEP (possibly including OTAGO components), LE and core strengthening, balance activities on level surface, stair and gait training   Consulted and Agree with Plan of Care Patient          G-Codes - Jan 28, 2015 12-18-04    Functional Assessment Tool Used Berg=41/56, TUG=20.43 sec, gait speed=2.46 ft/sec   Functional Limitation Mobility: Walking and moving around   Mobility: Walking and Moving Around Current Status [phone]) At least 20 percent but less than 40 percent impaired, limited or restricted   Mobility: Walking and Moving Around Goal Status [phone]) At least 1 percent but less than 20 percent impaired, limited or restricted       Problem List There are no active problems to display for this patient.  This  entire session was performed under direct supervision and direction of a licensed therapist/therapist assistant . I have personally read, edited and approve of the note as written. WEAVER,CHRISTINA, PT   Fanny Dance 01/09/2015, 9:57 AM  [facility] [address] [place], [place], [address] Phone: [phone]   Fax:  [phone]

## 2015-01-15 DIAGNOSIS — H3532 Exudative age-related macular degeneration: Secondary | ICD-10-CM | POA: Diagnosis not present

## 2015-01-18 ENCOUNTER — Ambulatory Visit: Payer: Medicare Other | Admitting: Physical Therapy

## 2015-01-18 ENCOUNTER — Encounter: Payer: Self-pay | Admitting: Physical Therapy

## 2015-01-18 DIAGNOSIS — R2689 Other abnormalities of gait and mobility: Secondary | ICD-10-CM | POA: Diagnosis not present

## 2015-01-18 DIAGNOSIS — Z7409 Other reduced mobility: Secondary | ICD-10-CM | POA: Diagnosis not present

## 2015-01-18 DIAGNOSIS — R296 Repeated falls: Secondary | ICD-10-CM

## 2015-01-18 DIAGNOSIS — R531 Weakness: Secondary | ICD-10-CM

## 2015-01-18 DIAGNOSIS — R269 Unspecified abnormalities of gait and mobility: Secondary | ICD-10-CM | POA: Diagnosis not present

## 2015-01-18 NOTE — Therapy (Signed)
[facility] [address] [place], [place], [address] Phone: [phone]   Fax:  [phone]  Physical Therapy Treatment  Patient Details  Name: Dawn Hampton MRN: [id number] Date of Birth: 05-May-1922 Referring Provider:  Leanna Battles, MD  Encounter Date: 01/18/2015      PT End of Session - 01/18/15 1534    Visit Number 2   Number of Visits 16   Date for PT Re-Evaluation 03/11/15   Authorization Type G code every 10th visit    PT Start Time 1533   PT Stop Time 1614   PT Time Calculation (min) 41 min   Equipment Utilized During Treatment Gait belt   Activity Tolerance Patient tolerated treatment well   Behavior During Therapy [facility] for tasks assessed/performed      Past Medical History  Diagnosis Date  . Osteoporosis   . Hypertension   . Macular degeneration   . Anemia   . Abdominal mass, right lower quadrant   . Shingles     T8    Past Surgical History  Procedure Laterality Date  . Total abdominal hysterectomy w/ bilateral salpingoophorectomy  1975  . Pud, partial gastrectomy 1992    . Cataracts  2004    There were no vitals filed for this visit.  Visit Diagnosis:  Weakness generalized  Decreased mobility and endurance  Frequent falls      Subjective Assessment - 01/18/15 1534    Subjective No new complaints. No falls or pain to report.   Currently in Pain? No/denies           Balance Exercises - 01/18/15 1537    OTAGO PROGRAM   Head Movements Standing;5 reps   Neck Movements Standing;5 reps   Back Extension Standing;5 reps   Trunk Movements Standing;5 reps   Ankle Movements Sitting;10 reps   Knee Extensor 10 reps;Weight (comment)  red theraband   Knee Flexor 10 reps  no weight   Hip ABductor 10 reps  no weight   Ankle Plantorflexors 20 reps, support   Ankle Dorsiflexors 20 reps, support   Knee Bends 10 reps, support   Backwards Walking No support   Walking and  Turning Around No assistive device   Sideways Walking No assistive device          PT Short Term Goals - 01/08/15 1503    PT SHORT TERM GOAL #1   Title The patient will be indep with [place] HEP (evidence based fall prevention) with written handouts.   Time 4   Period Weeks   Status New   PT SHORT TERM GOAL #2   Title The patient will increase walking speed from 2.46 ft/sec up to 2.71ft/sec to demo decreased risk for falls    Time 4   Period Weeks   Status New   PT SHORT TERM GOAL #3   Title The patient will improve Berg from 41/56 up to 45/56 to demo decreasing risk for falls   Time 4   Period Weeks   Status New   PT SHORT TERM GOAL #4   Title Pt will improve TUG score to less than or equal to 19 seconds for decreased fall risk.    Time 8   Period Weeks   Status New           PT Long Term Goals - 01/08/15 1457    PT LONG TERM GOAL #1   Title The patient will be indep with progression of HEP for post  d/c.    Time 8   Period Weeks   Status New   PT LONG TERM GOAL #2   Title The patient will improve Berg score from 41/56 up to 47/56 to demo decreasing risk for falls.   Time 8   Period Weeks   Status New   PT LONG TERM GOAL #3   Title Pt will improve TUG score to less than or equal to 17 seconds for decreased fall risk.    Baseline 20.43 seconds   Time 8   Period Weeks   Status New   PT LONG TERM GOAL #4   Title The patient will improve gait speed from 2.46 ft/sec up to 3.0 ft/sec to demo transition to "full community ambulator" classification of gait.   Time 8   Period Weeks   Status New           Plan - 01/18/15 1535    Clinical Impression Statement Intiated [place] program today with no issues reported. Also discussed appropriate shoes to wear with therapy as pt wore 1 inch heels to clinic today. She has a pair of rockports she will wear. Pt making progress toward goals.   Pt will benefit from skilled therapeutic intervention in order to improve on the  following deficits Abnormal gait;Decreased balance;Decreased activity tolerance;Decreased mobility;Decreased endurance;Decreased strength;Postural dysfunction;Impaired flexibility;Difficulty walking;Decreased coordination   Rehab Potential Good   PT Frequency 2x / week   PT Duration 8 weeks   PT Treatment/Interventions Gait training;Balance training;Neuromuscular re-education;Stair training;Functional mobility training;Patient/family education;Therapeutic activities;ADLs/Self Care Home Management   PT Next Visit Plan complete OTAGO;core and lower extremity strengthening and balance activities   Consulted and Agree with Plan of Care Patient        Problem List There are no active problems to display for this patient.   Willow Ora 01/18/2015, 4:23 PM  Willow Ora, PTA, [facility] [address], [place], [place] [address] [phone] 01/18/2015, 4:23 PM

## 2015-01-29 ENCOUNTER — Ambulatory Visit: Payer: Medicare Other | Admitting: Physical Therapy

## 2015-01-29 ENCOUNTER — Encounter: Payer: Self-pay | Admitting: Physical Therapy

## 2015-01-29 DIAGNOSIS — R2689 Other abnormalities of gait and mobility: Secondary | ICD-10-CM | POA: Diagnosis not present

## 2015-01-29 DIAGNOSIS — R269 Unspecified abnormalities of gait and mobility: Secondary | ICD-10-CM | POA: Diagnosis not present

## 2015-01-29 DIAGNOSIS — Z7409 Other reduced mobility: Secondary | ICD-10-CM

## 2015-01-29 DIAGNOSIS — R296 Repeated falls: Secondary | ICD-10-CM

## 2015-01-29 DIAGNOSIS — R531 Weakness: Secondary | ICD-10-CM | POA: Diagnosis not present

## 2015-01-29 NOTE — Therapy (Signed)
[facility] [address] [place], [place], [address] Phone: [phone]   Fax:  [phone]  Physical Therapy Treatment  Patient Details  Name: Dawn Hampton MRN: [id number] Date of Birth: 05-15-1922 Referring Provider:  Leanna Battles, MD  Encounter Date: 01/29/2015      PT End of Session - 01/29/15 0808    Visit Number 3   Number of Visits 16   Date for PT Re-Evaluation 03/11/15   Authorization Type G code every 10th visit    PT Start Time 0804   PT Stop Time 0844   PT Time Calculation (min) 40 min   Equipment Utilized During Treatment Gait belt   Activity Tolerance Patient tolerated treatment well   Behavior During Therapy [facility] for tasks assessed/performed      Past Medical History  Diagnosis Date  . Osteoporosis   . Hypertension   . Macular degeneration   . Anemia   . Abdominal mass, right lower quadrant   . Shingles     T8    Past Surgical History  Procedure Laterality Date  . Total abdominal hysterectomy w/ bilateral salpingoophorectomy  1975  . Pud, partial gastrectomy 1992    . Cataracts  2004    There were no vitals filed for this visit.  Visit Diagnosis:  Weakness generalized  Decreased mobility and endurance  Frequent falls      Subjective Assessment - 01/29/15 0807    Subjective No new complaints. No falls or pain to report.   Currently in Pain? No/denies            Balance Exercises - 01/29/15 0811    OTAGO PROGRAM   Head Movements Sitting;5 reps   Neck Movements Sitting;5 reps  cues on correct technique   Ankle Movements Standing;10 reps   Knee Extensor 10 reps  no resistance   Knee Flexor 10 reps  no resistance   Hip ABductor 10 reps  no resistance   Ankle Plantorflexors 20 reps, support   Ankle Dorsiflexors 20 reps, support   Knee Bends 10 reps, support   Backwards Walking No support   Walking and Turning Around No assistive device   Sideways Walking  No assistive device   Tandem Stance 10 seconds, no support   Tandem Walk No support   One Leg Stand 10 seconds, no support   Heel Walking Support   Toe Walk Support   Heel Toe Walking Backward No support  occasional assist on counter needed   Sit to Stand 10 reps, no support           PT Short Term Goals - 01/08/15 1503    PT SHORT TERM GOAL #1   Title The patient will be indep with [place] HEP (evidence based fall prevention) with written handouts.   Time 4   Period Weeks   Status New   PT SHORT TERM GOAL #2   Title The patient will increase walking speed from 2.46 ft/sec up to 2.66ft/sec to demo decreased risk for falls    Time 4   Period Weeks   Status New   PT SHORT TERM GOAL #3   Title The patient will improve Berg from 41/56 up to 45/56 to demo decreasing risk for falls   Time 4   Period Weeks   Status New   PT SHORT TERM GOAL #4   Title Pt will improve TUG score to less than or equal to 19 seconds for decreased fall risk.  Time 8   Period Weeks   Status New           PT Long Term Goals - 01/08/15 1457    PT LONG TERM GOAL #1   Title The patient will be indep with progression of HEP for post d/c.    Time 8   Period Weeks   Status New   PT LONG TERM GOAL #2   Title The patient will improve Berg score from 41/56 up to 47/56 to demo decreasing risk for falls.   Time 8   Period Weeks   Status New   PT LONG TERM GOAL #3   Title Pt will improve TUG score to less than or equal to 17 seconds for decreased fall risk.    Baseline 20.43 seconds   Time 8   Period Weeks   Status New   PT LONG TERM GOAL #4   Title The patient will improve gait speed from 2.46 ft/sec up to 3.0 ft/sec to demo transition to "full community ambulator" classification of gait.   Time 8   Period Weeks   Status New           Plan - 01/29/15 0808    Clinical Impression Statement Completed OTAGO program today without issues. Pt with sneaker style shoes on today and demo'd increased  balance/ankle stability. Pt stated she does not like this syle shoe because "it makes my feet hot" despite the improved balance/support they provide. Pt making progress toward goals.                                                     Pt will benefit from skilled therapeutic intervention in order to improve on the following deficits Abnormal gait;Decreased balance;Decreased activity tolerance;Decreased mobility;Decreased endurance;Decreased strength;Postural dysfunction;Impaired flexibility;Difficulty walking;Decreased coordination   Rehab Potential Good   PT Frequency 2x / week   PT Duration 8 weeks   PT Treatment/Interventions Gait training;Balance training;Neuromuscular re-education;Stair training;Functional mobility training;Patient/family education;Therapeutic activities;ADLs/Self Care Home Management   PT Next Visit Plan core and lower extremity strengthening and balance activities. continue to reinforce optimal shoe wear for balance/ankle stability.   Consulted and Agree with Plan of Care Patient        Problem List There are no active problems to display for this patient.   Willow Ora 01/29/2015, 12:51 PM  Willow Ora, PTA, [facility] [address] [place], [place] [address] [phone] 01/29/2015, 12:52 PM

## 2015-02-05 ENCOUNTER — Ambulatory Visit: Payer: Medicare Other | Attending: Internal Medicine | Admitting: Physical Therapy

## 2015-02-05 DIAGNOSIS — Z7409 Other reduced mobility: Secondary | ICD-10-CM | POA: Insufficient documentation

## 2015-02-05 DIAGNOSIS — R2689 Other abnormalities of gait and mobility: Secondary | ICD-10-CM | POA: Diagnosis not present

## 2015-02-05 DIAGNOSIS — R269 Unspecified abnormalities of gait and mobility: Secondary | ICD-10-CM | POA: Insufficient documentation

## 2015-02-05 DIAGNOSIS — R296 Repeated falls: Secondary | ICD-10-CM | POA: Insufficient documentation

## 2015-02-05 DIAGNOSIS — R531 Weakness: Secondary | ICD-10-CM | POA: Insufficient documentation

## 2015-02-05 NOTE — Therapy (Signed)
[facility] [address] [place], [place], [address] Phone: [phone]   Fax:  [phone]  Physical Therapy Treatment  Patient Details  Name: Dawn Hampton MRN: [id number] Date of Birth: [birth year] Referring Provider:  Leanna Battles, MD  Encounter Date: 02/05/2015      PT End of Session - 02/05/15 1147    Visit Number 4   Number of Visits 16   Date for PT Re-Evaluation 03/11/15   Authorization Type G code every 10th visit    PT Start Time 1018   PT Stop Time 1100   PT Time Calculation (min) 42 min   Equipment Utilized During Treatment Gait belt   Activity Tolerance Patient tolerated treatment well   Behavior During Therapy [facility] for tasks assessed/performed      Past Medical History  Diagnosis Date  . Osteoporosis   . Hypertension   . Macular degeneration   . Anemia   . Abdominal mass, right lower quadrant   . Shingles     T8    Past Surgical History  Procedure Laterality Date  . Total abdominal hysterectomy w/ bilateral salpingoophorectomy  1975  . Pud, partial gastrectomy 1992    . Cataracts  2004    There were no vitals filed for this visit.  Visit Diagnosis:  Decreased mobility and endurance  Weakness generalized  Frequent falls  Abnormality of gait  Poor balance      Subjective Assessment - 02/05/15 1023    Subjective pt reports no new complaints or falls. States she has completed OTAGO HEP at home, and reports the SLS exercise is currently the most challenging.    Patient Stated Goals improve balance, decrease falls, improve endurance so she can continue shopping with friends   Currently in Pain? No/denies       Treatment  Gait Training: -amb 300 ft with CGA for improved endurance. Cues provided for increased gait speed and incorporation of arm swing with gait.   Therapeutic Exercise: -Seated hip adduction strengthening with ball. 10 reps x 2. 3 second holds with  against ball.  -Seated hip abduction strengthening with red theraband resistance. 10 reps x 2. 3 second holds against max resistance.   Neuromuscular Reeducation:  Balance training on compliant surfaces: -Maintaining balance for 1 minute while standing on (blue) foam surface. CGA for safety.  -Maintaining balance for 30 seconds on foam surface with feet together. CGA for safety.  -Marching across red mat with CGA x 2 laps -Tandem walking across red mat with CGA x 2 laps -Sideways walking across red mat with CGA x 2 laps  Balance training along length of counter: -tandem walking x 4 laps with CGA -sideways walking x 4 laps with CGA -marching walking x 4 lap with CGA -backwards walking x 2 laps with CGA -Heel raises x 20 reps -SLS x 10 second holds using UE support only as needed. 5 reps bilaterally. SBA for safety. -Walking over two 6" obstacles along length of counter with CGA x 2 laps          PT Short Term Goals - 01/08/15 1503    PT SHORT TERM GOAL #1   Title The patient will be indep with [place] HEP (evidence based fall prevention) with written handouts.   Time 4   Period Weeks   Status New   PT SHORT TERM GOAL #2   Title The patient will increase walking speed from 2.46 ft/sec up to 2.52ft/sec to demo decreased risk for  falls    Time 4   Period Weeks   Status New   PT SHORT TERM GOAL #3   Title The patient will improve Berg from 41/56 up to 45/56 to demo decreasing risk for falls   Time 4   Period Weeks   Status New   PT SHORT TERM GOAL #4   Title Pt will improve TUG score to less than or equal to 19 seconds for decreased fall risk.    Time 8   Period Weeks   Status New           PT Long Term Goals - 01/08/15 1457    PT LONG TERM GOAL #1   Title The patient will be indep with progression of HEP for post d/c.    Time 8   Period Weeks   Status New   PT LONG TERM GOAL #2   Title The patient will improve Berg score from 41/56 up to 47/56 to demo decreasing  risk for falls.   Time 8   Period Weeks   Status New   PT LONG TERM GOAL #3   Title Pt will improve TUG score to less than or equal to 17 seconds for decreased fall risk.    Baseline 20.43 seconds   Time 8   Period Weeks   Status New   PT LONG TERM GOAL #4   Title The patient will improve gait speed from 2.46 ft/sec up to 3.0 ft/sec to demo transition to "full community ambulator" classification of gait.   Time 8   Period Weeks   Status New           Plan - 02/05/15 1150    Clinical Impression Statement pt challenged with today's activities, yet tolerated treatment session well. Pt reports she sometimes gets short of breath when walking or performing exercises, yet reported no shortness of breath today. pt is easily fatigued and required 3 rest breaks during treatment session. pt is making progress towards goals.   Pt will benefit from skilled therapeutic intervention in order to improve on the following deficits Abnormal gait;Decreased balance;Decreased activity tolerance;Decreased mobility;Decreased endurance;Decreased strength;Postural dysfunction;Impaired flexibility;Difficulty walking;Decreased coordination   Rehab Potential Good   PT Frequency 2x / week   PT Duration 8 weeks   PT Treatment/Interventions Gait training;Balance training;Neuromuscular re-education;Stair training;Functional mobility training;Patient/family education;Therapeutic activities;ADLs/Self Care Home Management   PT Next Visit Plan core and LE strengthening; balance activities; endurance training   Consulted and Agree with Plan of Care Patient      Problem List There are no active problems to display for this patient.   Fanny Dance 02/05/2015, 11:53 AM  [facility] [address] [place], [place], [address] Phone: [phone]   Fax:  [phone]

## 2015-02-08 ENCOUNTER — Encounter: Payer: Self-pay | Admitting: Physical Therapy

## 2015-02-08 ENCOUNTER — Ambulatory Visit: Payer: Medicare Other | Admitting: Physical Therapy

## 2015-02-08 DIAGNOSIS — R531 Weakness: Secondary | ICD-10-CM

## 2015-02-08 DIAGNOSIS — Z7409 Other reduced mobility: Secondary | ICD-10-CM

## 2015-02-08 DIAGNOSIS — R2689 Other abnormalities of gait and mobility: Secondary | ICD-10-CM | POA: Diagnosis not present

## 2015-02-08 DIAGNOSIS — R296 Repeated falls: Secondary | ICD-10-CM

## 2015-02-08 DIAGNOSIS — R269 Unspecified abnormalities of gait and mobility: Secondary | ICD-10-CM

## 2015-02-08 NOTE — Therapy (Signed)
[facility] [address] [place], [place], [address] Phone: [phone]   Fax:  [phone]  Physical Therapy Treatment  Patient Details  Name: Dawn Hampton MRN: [id number] Date of Birth: 09/25/1921 Referring Provider:  Leanna Battles, MD  Encounter Date: 02/08/2015     02/08/15 1453  PT Visits / Re-Eval  Visit Number 5  Number of Visits 16  Date for PT Re-Evaluation 03/11/15  Authorization  Authorization Type G code every 10th visit   PT Time Calculation  PT Start Time 1449  PT Stop Time 1527  PT Time Calculation (min) 38 min  PT - End of Session  Equipment Utilized During Treatment Gait belt  Activity Tolerance Patient tolerated treatment well  Behavior During Therapy [facility] for tasks assessed/performed    Past Medical History  Diagnosis Date  . Osteoporosis   . Hypertension   . Macular degeneration   . Anemia   . Abdominal mass, right lower quadrant   . Shingles     T8    Past Surgical History  Procedure Laterality Date  . Total abdominal hysterectomy w/ bilateral salpingoophorectomy  1975  . Pud, partial gastrectomy 1992    . Cataracts  2004    There were no vitals filed for this visit.  Visit Diagnosis:  Decreased mobility and endurance  Weakness generalized  Frequent falls  Abnormality of gait  Poor balance   Treatment: Exercise Hook lying on mat Attempted bridge, pt unable to do more than 2 reps due to pain Double knee to chest stretch 30 sec's x 3 reps passive   Left side lying Clam shells x 10 reps Hip abduction x 10 reps  Seated edge of mat Forward fold stretch 10 sec holds x 3 reps Lumbar extension x 10 reps  Seated on green pball Bouncing x 1 minute with emphasis on tall posture Pelvic rocking fwd/bwd and laterally x 10 each way/side Alternating arm raises x 10 reps "W" x 10 reps   Gait 345 feet x 1 with supervision. Cues on posture, increased step length  bil legs and for reciprocal arm swing with gait.   345 feet with supervision using ski poles to assist with arm swing and to increase gait speed (with PTA setting pace). cues to increase step length and for posture.         PT Short Term Goals - 01/08/15 1503    PT SHORT TERM GOAL #1   Title The patient will be indep with [place] HEP (evidence based fall prevention) with written handouts.   Time 4   Period Weeks   Status New   PT SHORT TERM GOAL #2   Title The patient will increase walking speed from 2.46 ft/sec up to 2.73ft/sec to demo decreased risk for falls    Time 4   Period Weeks   Status New   PT SHORT TERM GOAL #3   Title The patient will improve Berg from 41/56 up to 45/56 to demo decreasing risk for falls   Time 4   Period Weeks   Status New   PT SHORT TERM GOAL #4   Title Pt will improve TUG score to less than or equal to 19 seconds for decreased fall risk.    Time 8   Period Weeks   Status New           PT Long Term Goals - 01/08/15 1457    PT LONG TERM GOAL #1   Title The patient will  be indep with progression of HEP for post d/c.    Time 8   Period Weeks   Status New   PT LONG TERM GOAL #2   Title The patient will improve Berg score from 41/56 up to 47/56 to demo decreasing risk for falls.   Time 8   Period Weeks   Status New   PT LONG TERM GOAL #3   Title Pt will improve TUG score to less than or equal to 17 seconds for decreased fall risk.    Baseline 20.43 seconds   Time 8   Period Weeks   Status New   PT LONG TERM GOAL #4   Title The patient will improve gait speed from 2.46 ft/sec up to 3.0 ft/sec to demo transition to "full community ambulator" classification of gait.   Time 8   Period Weeks   Status New        02/08/15 1453  Plan  Clinical Impression Statement Pt continues to be challenged with higher level balance activities. Pt making progress toward goals.  Pt will benefit from skilled therapeutic intervention in order to improve on  the following deficits Abnormal gait;Decreased balance;Decreased activity tolerance;Decreased mobility;Decreased endurance;Decreased strength;Postural dysfunction;Impaired flexibility;Difficulty walking;Decreased coordination  Rehab Potential Good  PT Frequency 2x / week  PT Duration 8 weeks  PT Treatment/Interventions Gait training;Balance training;Neuromuscular re-education;Stair training;Functional mobility training;Patient/family education;Therapeutic activities;ADLs/Self Care Home Management  PT Next Visit Plan core and LE strengthening; balance activities; endurance training  Consulted and Agree with Plan of Care Patient     Problem List There are no active problems to display for this patient.   Willow Ora 02/10/2015, 7:28 PM  Willow Ora, PTA, [facility] [address], [place], [place] [address] [phone] 02/10/2015, 7:28 PM

## 2015-02-11 ENCOUNTER — Ambulatory Visit: Payer: Medicare Other | Admitting: Physical Therapy

## 2015-02-11 DIAGNOSIS — Z7409 Other reduced mobility: Secondary | ICD-10-CM | POA: Diagnosis not present

## 2015-02-11 DIAGNOSIS — R296 Repeated falls: Secondary | ICD-10-CM | POA: Diagnosis not present

## 2015-02-11 DIAGNOSIS — R2689 Other abnormalities of gait and mobility: Secondary | ICD-10-CM | POA: Diagnosis not present

## 2015-02-11 DIAGNOSIS — R269 Unspecified abnormalities of gait and mobility: Secondary | ICD-10-CM | POA: Diagnosis not present

## 2015-02-11 DIAGNOSIS — R531 Weakness: Secondary | ICD-10-CM

## 2015-02-12 DIAGNOSIS — H3532 Exudative age-related macular degeneration: Secondary | ICD-10-CM | POA: Diagnosis not present

## 2015-02-12 NOTE — Therapy (Addendum)
[facility] [address] [place], [place], [address] Phone: [phone]   Fax:  [phone]  Physical Therapy Treatment  Patient Details  Name: Dawn Hampton MRN: [id number] Date of Birth: [birth year] Referring Provider:  Leanna Battles, MD  Encounter Date: 02/11/2015      PT End of Session - 02/12/15 0749    Visit Number 6   Number of Visits 16   Date for PT Re-Evaluation 03/11/15   Authorization Type G code every 10th visit    PT Start Time 0800   PT Stop Time 0845   PT Time Calculation (min) 45 min   Equipment Utilized During Treatment Gait belt   Activity Tolerance Patient tolerated treatment well   Behavior During Therapy [facility] for tasks assessed/performed      Past Medical History  Diagnosis Date  . Osteoporosis   . Hypertension   . Macular degeneration   . Anemia   . Abdominal mass, right lower quadrant   . Shingles     T8    Past Surgical History  Procedure Laterality Date  . Total abdominal hysterectomy w/ bilateral salpingoophorectomy  1975  . Pud, partial gastrectomy 1992    . Cataracts  2004    There were no vitals filed for this visit.  Visit Diagnosis:  Decreased mobility and endurance  Weakness generalized  Frequent falls  Abnormality of gait  Poor balance      Subjective Assessment - 02/11/15 0803    Subjective No new complaints. No falls to report and pt is continuing to do HEP. pt reports back pain when she gets up the middle of the night.    Patient Stated Goals improve balance, decrease falls, improve endurance so she can continue shopping with friends   Currently in Pain? Yes   Pain Score 5    Pain Location Hip   Pain Orientation Right   Pain Descriptors / Indicators Sore;Aching   Pain Type Chronic pain   Pain Onset More than a month ago   Pain Frequency Intermittent       Treatment   Gait Training: -Amb 228 feet with CGA/MIN assist with cues for step  length, arm swing, and posture. pt's 02 saturation remained above 97% during gait training activities.   Therapeutic Exercise: -Seated hip adduction strengthening with ball. 2 sets of 10 reps.  -R sidelying clamshells x 10 reps -hamstring stretch: standing with trunk flexion over mat w/ UEs for stabilization, pt instructed to walk LEs back until a stretch is felt on the back of LEs.  Neuromuscular Re-education: Balance training on double layered red mats at parallel bars with CGA as needed: -two laps forwards walking  -staggered stance with goal of maintaining balance for 30 seconds.  Repeated with alternate foot in front.  - heel raises x 10 reps - toe raises x 10 reps -mini squats x 10 reps  Postural exercises: -"W" scapular retraction exercise. 2 sets of 10 reps with cues for positioning. - Yellow theraband shoulder abduction exercise with cues for correct form and pace.          PT Short Term Goals - 01/08/15 1503    PT SHORT TERM GOAL #1   Title The patient will be indep with [place] HEP (evidence based fall prevention) with written handouts.   Time 4   Period Weeks   Status New   PT SHORT TERM GOAL #2   Title The patient will increase walking speed from 2.46 ft/sec up  to 2.77ft/sec to demo decreased risk for falls    Time 4   Period Weeks   Status New   PT SHORT TERM GOAL #3   Title The patient will improve Berg from 41/56 up to 45/56 to demo decreasing risk for falls   Time 4   Period Weeks   Status New   PT SHORT TERM GOAL #4   Title Pt will improve TUG score to less than or equal to 19 seconds for decreased fall risk.    Time 8   Period Weeks   Status New           PT Long Term Goals - 01/08/15 1457    PT LONG TERM GOAL #1   Title The patient will be indep with progression of HEP for post d/c.    Time 8   Period Weeks   Status New   PT LONG TERM GOAL #2   Title The patient will improve Berg score from 41/56 up to 47/56 to demo decreasing risk for falls.    Time 8   Period Weeks   Status New   PT LONG TERM GOAL #3   Title Pt will improve TUG score to less than or equal to 17 seconds for decreased fall risk.    Baseline 20.43 seconds   Time 8   Period Weeks   Status New   PT LONG TERM GOAL #4   Title The patient will improve gait speed from 2.46 ft/sec up to 3.0 ft/sec to demo transition to "full community ambulator" classification of gait.   Time 8   Period Weeks   Status New           Plan - 02/12/15 0751    Clinical Impression Statement pt tolerated treatment session well today and is progressing towards goals. pt demo improved improved posture and stride length with gait.    Pt will benefit from skilled therapeutic intervention in order to improve on the following deficits Abnormal gait;Decreased balance;Decreased activity tolerance;Decreased mobility;Decreased endurance;Decreased strength;Postural dysfunction;Impaired flexibility;Difficulty walking;Decreased coordination   Rehab Potential Good   PT Frequency 2x / week   PT Duration 8 weeks   PT Treatment/Interventions Gait training;Balance training;Neuromuscular re-education;Stair training;Functional mobility training;Patient/family education;Therapeutic activities;ADLs/Self Care Home Management   PT Next Visit Plan core and LE strengthening; balance activities; endurance training   Consulted and Agree with Plan of Care Patient        Problem List There are no active problems to display for this patient.   Fanny Dance 02/12/2015, 8:01 AM  [facility] [address] [place], [place], [address] Phone: [phone]   Fax:  [phone]  This entire session was performed under direct supervision and direction of a licensed therapist/therapist assistant . I have personally read, edited and approve of the note as written.  Willow Ora, PTA, [facility] [address], [place], [place] [address] [phone] 02/12/2015, 11:54 PM

## 2015-02-15 ENCOUNTER — Encounter: Payer: Self-pay | Admitting: Physical Therapy

## 2015-02-15 ENCOUNTER — Ambulatory Visit: Payer: Medicare Other | Admitting: Physical Therapy

## 2015-02-15 DIAGNOSIS — R2689 Other abnormalities of gait and mobility: Secondary | ICD-10-CM

## 2015-02-15 DIAGNOSIS — R531 Weakness: Secondary | ICD-10-CM | POA: Diagnosis not present

## 2015-02-15 DIAGNOSIS — Z7409 Other reduced mobility: Secondary | ICD-10-CM | POA: Diagnosis not present

## 2015-02-15 DIAGNOSIS — R296 Repeated falls: Secondary | ICD-10-CM | POA: Diagnosis not present

## 2015-02-15 DIAGNOSIS — R269 Unspecified abnormalities of gait and mobility: Secondary | ICD-10-CM

## 2015-02-15 NOTE — Therapy (Signed)
[facility] [address] [place], [place], [address] Phone: [phone]   Fax:  [phone]  Physical Therapy Treatment  Patient Details  Name: Dawn Hampton MRN: [id number] Date of Birth: 1922-01-10 Referring Provider:  Leanna Battles, MD  Encounter Date: 02/15/2015      PT End of Session - 02/15/15 1453    Visit Number 7   Number of Visits 16   Date for PT Re-Evaluation 03/11/15   Authorization Type G code every 10th visit    PT Start Time 1450   PT Stop Time 1530   PT Time Calculation (min) 40 min   Equipment Utilized During Treatment Gait belt   Activity Tolerance Patient tolerated treatment well   Behavior During Therapy [facility] for tasks assessed/performed      Past Medical History  Diagnosis Date  . Osteoporosis   . Hypertension   . Macular degeneration   . Anemia   . Abdominal mass, right lower quadrant   . Shingles     T8    Past Surgical History  Procedure Laterality Date  . Total abdominal hysterectomy w/ bilateral salpingoophorectomy  1975  . Pud, partial gastrectomy 1992    . Cataracts  2004    There were no vitals filed for this visit.  Visit Diagnosis:  Decreased mobility and endurance  Abnormality of gait  Poor balance  Frequent falls  Weakness generalized      Subjective Assessment - 02/15/15 1451    Subjective No new complaints. No falls to report. No pain at this time, did have some this am, 6/10. Went away by the time she had walked to the bathroom.   Currently in Pain? No/denies   Pain Score 0-No pain            [facility] Adult PT Treatment/Exercise - 02/15/15 1459    Ambulation/Gait   Ambulation/Gait Yes   Ambulation/Gait Assistance 5: Supervision   Ambulation/Gait Assistance Details cues on posture and to increase step length   Ambulation Distance (Feet) 440 Feet   Assistive device None   Gait Pattern Step-through pattern;Decreased step length -  right;Decreased step length - left;Trunk flexed;Narrow base of support   Ambulation Surface Level;Indoor   Gait velocity 12.0= 2.73 ft/sec without AD   Berg Balance Test   Sit to Stand Able to stand without using hands and stabilize independently   Standing Unsupported Able to stand safely 2 minutes   Sitting with Back Unsupported but Feet Supported on Floor or Stool Able to sit safely and securely 2 minutes   Stand to Sit Sits safely with minimal use of hands   Transfers Able to transfer safely, minor use of hands   Standing Unsupported with Eyes Closed Able to stand 10 seconds safely   Standing Ubsupported with Feet Together Able to place feet together independently and stand for 1 minute with supervision   From Standing, Reach Forward with Outstretched Arm Can reach forward >12 cm safely (5")  8 inches   From Standing Position, Pick up Object from Floor Able to pick up shoe safely and easily   From Standing Position, Turn to Look Behind Over each Shoulder Turn sideways only but maintains balance   Turn 360 Degrees Able to turn 360 degrees safely but slowly  > 5 seconds both ways   Standing Unsupported, Alternately Place Feet on Step/Stool Able to stand independently and safely and complete 8 steps in 20 seconds  11.72   Standing Unsupported, One Foot in  Front Able to plae foot ahead of the other independently and hold 30 seconds   Standing on One Leg Tries to lift leg/unable to hold 3 seconds but remains standing independently   Total Score 46   Timed Up and Go Test   TUG Normal TUG   Normal TUG (seconds) 15.5     Self Care: Reviewed pt's current HEP, OTAGO, program and she had no issues. Independent with program.       PT Short Term Goals - 02/15/15 1523    PT SHORT TERM GOAL #1   Title The patient will be indep with [place] HEP (evidence based fall prevention) with written handouts.   Time --   Period --   Status Achieved   PT SHORT TERM GOAL #2   Title The patient will  increase walking speed from 2.46 ft/sec up to 2.9f/sec to demo decreased risk for falls    Baseline 02/15/15: 2.73 ft/sec    Time --   Period --   Status Not Met   PT SHORT TERM GOAL #3   Title The patient will improve Berg from 41/56 up to 45/56 to demo decreasing risk for falls   Baseline 02/15/15: 46/56   Time --   Period --   Status Achieved   PT SHORT TERM GOAL #4   Title Pt will improve TUG score to less than or equal to 19 seconds for decreased fall risk.    Baseline 02/15/15: score is15.5 sec's no AD   Time --   Period --   Status Achieved           PT Long Term Goals - 01/08/15 1457    PT LONG TERM GOAL #1   Title The patient will be indep with progression of HEP for post d/c.    Time 8   Period Weeks   Status New   PT LONG TERM GOAL #2   Title The patient will improve Berg score from 41/56 up to 47/56 to demo decreasing risk for falls.   Time 8   Period Weeks   Status New   PT LONG TERM GOAL #3   Title Pt will improve TUG score to less than or equal to 17 seconds for decreased fall risk.    Baseline 20.43 seconds   Time 8   Period Weeks   Status New   PT LONG TERM GOAL #4   Title The patient will improve gait speed from 2.46 ft/sec up to 3.0 ft/sec to demo transition to "full community ambulator" classification of gait.   Time 8   Period Weeks   Status New            Plan - 02/15/15 1453    Clinical Impression Statement All but 1 STG was met, the 10 meter gait speed was not met. There was progress made from eval to date. Pt is progressing toward LTG's.   Pt will benefit from skilled therapeutic intervention in order to improve on the following deficits Abnormal gait;Decreased balance;Decreased activity tolerance;Decreased mobility;Decreased endurance;Decreased strength;Postural dysfunction;Impaired flexibility;Difficulty walking;Decreased coordination   Rehab Potential Good   PT Frequency 2x / week   PT Duration 8 weeks   PT Treatment/Interventions  Gait training;Balance training;Neuromuscular re-education;Stair training;Functional mobility training;Patient/family education;Therapeutic activities;ADLs/Self Care Home Management   PT Next Visit Plan core and LE strengthening; balance activities; endurance training   Consulted and Agree with Plan of Care Patient        Problem List There are no active problems  to display for this patient.   Willow Ora 02/15/2015, 6:39 PM  Willow Ora, PTA, [place] [address] [place], [place] [address] [phone] 02/15/2015, 6:39 PM

## 2015-02-19 ENCOUNTER — Ambulatory Visit: Payer: Medicare Other | Admitting: Rehabilitative and Restorative Service Providers"

## 2015-02-19 DIAGNOSIS — R269 Unspecified abnormalities of gait and mobility: Secondary | ICD-10-CM | POA: Diagnosis not present

## 2015-02-19 DIAGNOSIS — R296 Repeated falls: Secondary | ICD-10-CM

## 2015-02-19 DIAGNOSIS — R531 Weakness: Secondary | ICD-10-CM | POA: Diagnosis not present

## 2015-02-19 DIAGNOSIS — R2689 Other abnormalities of gait and mobility: Secondary | ICD-10-CM | POA: Diagnosis not present

## 2015-02-19 DIAGNOSIS — Z7409 Other reduced mobility: Secondary | ICD-10-CM | POA: Diagnosis not present

## 2015-02-19 NOTE — Therapy (Signed)
[facility] [address] [place], [place], [address] Phone: [phone]   Fax:  [phone]  Physical Therapy Treatment  Patient Details  Name: Dawn Hampton MRN: [id number] Date of Birth: 14-Feb-1922 Referring Provider:  Leanna Battles, MD  Encounter Date: 02/19/2015      PT End of Session - 02/19/15 1638    Visit Number 8   Number of Visits 16   Date for PT Re-Evaluation 03/11/15   Authorization Type G code every 10th visit    PT Start Time 1235   PT Stop Time 1316   PT Time Calculation (min) 41 min   Equipment Utilized During Treatment Gait belt   Activity Tolerance Patient tolerated treatment well   Behavior During Therapy [facility] for tasks assessed/performed      Past Medical History  Diagnosis Date  . Osteoporosis   . Hypertension   . Macular degeneration   . Anemia   . Abdominal mass, right lower quadrant   . Shingles     T8    Past Surgical History  Procedure Laterality Date  . Total abdominal hysterectomy w/ bilateral salpingoophorectomy  1975  . Pud, partial gastrectomy 1992    . Cataracts  2004    There were no vitals filed for this visit.  Visit Diagnosis:  Abnormality of gait  Weakness generalized  Frequent falls      Subjective Assessment - 02/19/15 1237    Subjective The patient reports feeling like balance is about the same.     Currently in Pain? No/denies      NEUROMUSCULAR RE-EDUCATION: Standing tapping R/L to cones with UEs reaching overhead with min A for safety Sidestepping R and L x 10 steps each direction x 2 reps Sit<>stand x 8 reps with "w" for posture re-education Standing rolling shoulders back/down for posture re-education Rolling ball up/down wall for overhead reaching and upright posture with CGA for safety x 4 repetitions  Gait: Ambulation with cues on longer stride length and upright posture with CGA Gait with turns and CGA for safety Gait with fast  paced walking x 230 ft x 2 reps with cues on arm swing with CGA Dynamic gait activities walking on level surfaces with CGA assistance x 400+ feet distance performing 180 degree turns, 360 degree turns, starts/stops, backwards walking and general direction changes.  HR=48 bpm after ambulation         PT Short Term Goals - 02/15/15 1523    PT SHORT TERM GOAL #1   Title The patient will be indep with Otago HEP (evidence based fall prevention) with written handouts.   Time --   Period --   Status Achieved   PT SHORT TERM GOAL #2   Title The patient will increase walking speed from 2.46 ft/sec up to 2.64f/sec to demo decreased risk for falls    Baseline 02/15/15: 2.73 ft/sec    Time --   Period --   Status Not Met   PT SHORT TERM GOAL #3   Title The patient will improve Berg from 41/56 up to 45/56 to demo decreasing risk for falls   Baseline 02/15/15: 46/56   Time --   Period --   Status Achieved   PT SHORT TERM GOAL #4   Title Pt will improve TUG score to less than or equal to 19 seconds for decreased fall risk.    Baseline 02/15/15: score is15.5 sec's no AD   Time --   Period --   Status  Achieved           PT Long Term Goals - 01/08/15 1457    PT LONG TERM GOAL #1   Title The patient will be indep with progression of HEP for post d/c.    Time 8   Period Weeks   Status New   PT LONG TERM GOAL #2   Title The patient will improve Berg score from 41/56 up to 47/56 to demo decreasing risk for falls.   Time 8   Period Weeks   Status New   PT LONG TERM GOAL #3   Title Pt will improve TUG score to less than or equal to 17 seconds for decreased fall risk.    Baseline 20.43 seconds   Time 8   Period Weeks   Status New   PT LONG TERM GOAL #4   Title The patient will improve gait speed from 2.46 ft/sec up to 3.0 ft/sec to demo transition to "full community ambulator" classification of gait.   Time 8   Period Weeks   Status New               Plan - 02/19/15 1638     Clinical Impression Statement The patient continuing to progress balance and gait activities.  She is not performing HEP at recommended frequency and PT discussed importance of completing Otago atleast 3 days/week for fall reduction.   PT Next Visit Plan Discuss scheduling more visits vs. f/u in 2-3 weeks to check on progress with [facility] program.   Consulted and Agree with Plan of Care Patient        Problem List There are no active problems to display for this patient.   [place], [place] 02/19/2015, 4:39 PM  [facility] [address] [place], [place], [address] Phone: [phone]   Fax:  [phone]

## 2015-02-20 DIAGNOSIS — H3532 Exudative age-related macular degeneration: Secondary | ICD-10-CM | POA: Diagnosis not present

## 2015-02-21 ENCOUNTER — Ambulatory Visit: Payer: Medicare Other | Admitting: Rehabilitative and Restorative Service Providers"

## 2015-02-21 DIAGNOSIS — R531 Weakness: Secondary | ICD-10-CM

## 2015-02-21 DIAGNOSIS — Z7409 Other reduced mobility: Secondary | ICD-10-CM | POA: Diagnosis not present

## 2015-02-21 DIAGNOSIS — R269 Unspecified abnormalities of gait and mobility: Secondary | ICD-10-CM | POA: Diagnosis not present

## 2015-02-21 DIAGNOSIS — R296 Repeated falls: Secondary | ICD-10-CM | POA: Diagnosis not present

## 2015-02-21 DIAGNOSIS — R2689 Other abnormalities of gait and mobility: Secondary | ICD-10-CM | POA: Diagnosis not present

## 2015-02-21 NOTE — Therapy (Signed)
[facility] [address] [place], [place], [address] Phone: [phone]   Fax:  [phone]  Physical Therapy Treatment  Patient Details  Name: Dawn Hampton MRN: [id number] Date of Birth: 1921-11-05 Referring Provider:  Leanna Battles, MD  Encounter Date: 02/21/2015      PT End of Session - 02/21/15 1407    Visit Number 9   Number of Visits 16   Date for PT Re-Evaluation 03/11/15   Authorization Type G code every 10th visit    PT Start Time 1318   PT Stop Time 1405   PT Time Calculation (min) 47 min   Equipment Utilized During Treatment Gait belt   Activity Tolerance Patient tolerated treatment well   Behavior During Therapy [facility] for tasks assessed/performed      Past Medical History  Diagnosis Date  . Osteoporosis   . Hypertension   . Macular degeneration   . Anemia   . Abdominal mass, right lower quadrant   . Shingles     T8    Past Surgical History  Procedure Laterality Date  . Total abdominal hysterectomy w/ bilateral salpingoophorectomy  1975  . Pud, partial gastrectomy 1992    . Cataracts  2004    There were no vitals filed for this visit.  Visit Diagnosis:  Abnormality of gait  Weakness generalized      Subjective Assessment - 02/21/15 1320    Subjective The patient reports that she did her exercises yesterday before getting a shot in her eye for macular degeneration.     Patient Stated Goals improve balance, decrease falls, improve endurance so she can continue shopping with friends   Currently in Pain? No/denies            [facility] PT Assessment - 02/21/15 1330    Standardized Balance Assessment   Standardized Balance Assessment Berg Balance Test   Berg Balance Test   Sit to Stand Able to stand  independently using hands   Standing Unsupported Able to stand safely 2 minutes   Sitting with Back Unsupported but Feet Supported on Floor or Stool Able to sit safely and securely  2 minutes   Stand to Sit Controls descent by using hands   Transfers Able to transfer safely, definite need of hands   Standing Unsupported with Eyes Closed Able to stand 10 seconds safely   Standing Ubsupported with Feet Together Able to place feet together independently and stand 1 minute safely   From Standing, Reach Forward with Outstretched Arm Can reach forward >12 cm safely (5")  6"   From Standing Position, Pick up Object from Floor Able to pick up shoe safely and easily   From Standing Position, Turn to Look Behind Over each Shoulder Looks behind one side only/other side shows less weight shift   Turn 360 Degrees Able to turn 360 degrees safely but slowly   Standing Unsupported, Alternately Place Feet on Step/Stool Able to stand independently and safely and complete 8 steps in 20 seconds   Standing Unsupported, One Foot in Front Able to place foot tandem independently and hold 30 seconds   Standing on One Leg Able to lift leg independently and hold 5-10 seconds   Total Score 48          Balance Exercises - 02/21/15 1343    OTAGO PROGRAM   Head Movements Standing;5 reps   Neck Movements Standing;5 reps   Trunk Movements Standing   Ankle Movements Sitting;10 reps   Knee  Extensor 10 reps   Knee Flexor 10 reps   Hip ABductor 10 reps   Ankle Plantorflexors 20 reps, support   Ankle Dorsiflexors 20 reps, support   Knee Bends 10 reps, support   Backwards Walking No support   Walking and Turning Around No assistive device   Sideways Walking No assistive device   Tandem Stance 10 seconds, no support   Tandem Walk Support   One Leg Stand 10 seconds, no support   Heel Walking Support   Toe Walk Support   Sit to Stand 10 reps, bilateral support           PT Education - 02/21/15 1405    Education provided Yes   Education Details Reviewed all HEP [facility]) and recommended f/u in 3 weeks    Person(s) Educated Patient   Methods Explanation;Demonstration   Comprehension  Verbalized understanding;Returned demonstration          PT Short Term Goals - 02/15/15 1523    PT SHORT TERM GOAL #1   Title The patient will be indep with Otago HEP (evidence based fall prevention) with written handouts.   Time --   Period --   Status Achieved   PT SHORT TERM GOAL #2   Title The patient will increase walking speed from 2.46 ft/sec up to 2.67f/sec to demo decreased risk for falls    Baseline 02/15/15: 2.73 ft/sec    Time --   Period --   Status Not Met   PT SHORT TERM GOAL #3   Title The patient will improve Berg from 41/56 up to 45/56 to demo decreasing risk for falls   Baseline 02/15/15: 46/56   Time --   Period --   Status Achieved   PT SHORT TERM GOAL #4   Title Pt will improve TUG score to less than or equal to 19 seconds for decreased fall risk.    Baseline 02/15/15: score is15.5 sec's no AD   Time --   Period --   Status Achieved           PT Long Term Goals - 02/21/15 1329    PT LONG TERM GOAL #1   Title The patient will be indep with progression of HEP for post d/c.    Baseline Reviewed and plan to f/u 1 more visit to check progress.   Time 8   Period Weeks   Status On-going   PT LONG TERM GOAL #2   Title The patient will improve Berg score from 41/56 up to 47/56 to demo decreasing risk for falls.   Baseline Pt scored 48/56 02/21/2015.   Time 8   Period Weeks   Status Achieved   PT LONG TERM GOAL #3   Title Pt will improve TUG score to less than or equal to 17 seconds for decreased fall risk.    Baseline Met at STG reassessment (see above).   Time 8   Period Weeks   Status Achieved   PT LONG TERM GOAL #4   Title The patient will improve gait speed from 2.46 ft/sec up to 3.0 ft/sec to demo transition to "full community ambulator" classification of gait.   Baseline See above STG.   Time 8   Period Weeks   Status Partially Met               Plan - 02/21/15 1358    Clinical Impression Statement The patient partially met LTGs  except for gait speed and PT to f/u on LTG  for HEP.  Plan to d/c next visit with HEP.   PT Next Visit Plan f/u in 3 weeks to check progress with [facility] for falls reduction.   Consulted and Agree with Plan of Care Patient        Problem List There are no active problems to display for this patient.   [place], [place] 02/21/2015, 2:11 PM  [facility] [address] [place], [place], [address] Phone: [phone]   Fax:  [phone]

## 2015-03-12 ENCOUNTER — Ambulatory Visit: Payer: Medicare Other | Admitting: Rehabilitative and Restorative Service Providers"

## 2015-03-13 ENCOUNTER — Encounter: Payer: Self-pay | Admitting: Rehabilitative and Restorative Service Providers"

## 2015-03-13 ENCOUNTER — Ambulatory Visit: Payer: Medicare Other | Attending: Internal Medicine | Admitting: Rehabilitative and Restorative Service Providers"

## 2015-03-13 DIAGNOSIS — R531 Weakness: Secondary | ICD-10-CM | POA: Diagnosis not present

## 2015-03-13 DIAGNOSIS — R269 Unspecified abnormalities of gait and mobility: Secondary | ICD-10-CM | POA: Diagnosis not present

## 2015-03-13 NOTE — Therapy (Signed)
[facility] [address] [place], [place], [address] Phone: [phone]   Fax:  [phone]  Physical Therapy Treatment  Patient Details  Name: Dawn Hampton MRN: [id number] Date of Birth: [birth year] Referring Provider:  Leanna Battles, MD  Encounter Date: 03/13/2015      PT End of Session - 03/13/15 1229    Visit Number 10   Number of Visits 16   Date for PT Re-Evaluation 03/11/15   Authorization Type G code every 10th visit    PT Start Time 1153   PT Stop Time 1220   PT Time Calculation (min) 27 min   Activity Tolerance Patient tolerated treatment well   Behavior During Therapy [facility] for tasks assessed/performed      Past Medical History  Diagnosis Date  . Osteoporosis   . Hypertension   . Macular degeneration   . Anemia   . Abdominal mass, right lower quadrant   . Shingles     T8    Past Surgical History  Procedure Laterality Date  . Total abdominal hysterectomy w/ bilateral salpingoophorectomy  1975  . Pud, partial gastrectomy 1992    . Cataracts  2004    There were no vitals filed for this visit.  Visit Diagnosis:  No diagnosis found.      Subjective Assessment - 03/13/15 1159    Subjective The patient is doing her Otago exercises, but is avoiding the one in single leg stance "I haven't tried it."   Patient Stated Goals improve balance, decrease falls, improve endurance so she can continue shopping with friends   Currently in Pain? No/denies           Balance Exercises - 03/13/15 1202    OTAGO PROGRAM   Head Movements Standing;5 reps   Neck Movements 5 reps;Standing  needs cues for correct technique   Trunk Movements Standing;5 reps   Ankle Movements Sitting  5 reps to review   Knee Extensor 5 reps  to review all HEP   Knee Flexor 5 reps   Hip ABductor 5 reps   Ankle Plantorflexors --  5 reps to demo HEP   Ankle Dorsiflexors --  5 reps to demo HEP   Knee Bends --  5  reps to review HEP   Backwards Walking No support   Walking and Turning Around No assistive device   Sideways Walking No assistive device   Tandem Stance 10 seconds, no support   Tandem Walk No support   One Leg Stand 10 seconds, no support   Heel Walking Support   Toe Walk Support   Sit to Stand 5 reps, one support   Overall OTAGO Comments Reviewed patient's HEP for post d/c plan             PT Short Term Goals - 02/15/15 1523    PT SHORT TERM GOAL #1   Title The patient will be indep with Otago HEP (evidence based fall prevention) with written handouts.   Time --   Period --   Status Achieved   PT SHORT TERM GOAL #2   Title The patient will increase walking speed from 2.46 ft/sec up to 2.72f/sec to demo decreased risk for falls    Baseline 02/15/15: 2.73 ft/sec    Time --   Period --   Status Not Met   PT SHORT TERM GOAL #3   Title The patient will improve Berg from 41/56 up to 45/56 to demo decreasing risk for falls  Baseline 02/15/15: 46/56   Time --   Period --   Status Achieved   PT SHORT TERM GOAL #4   Title Pt will improve TUG score to less than or equal to 19 seconds for decreased fall risk.    Baseline 02/15/15: score is15.5 sec's no AD   Time --   Period --   Status Achieved           PT Long Term Goals - 03/15/15 1201    PT LONG TERM GOAL #1   Title The patient will be indep with progression of HEP for post d/c.    Baseline Met on March 15, 2015   Time 8   Period Weeks   Status Achieved   PT LONG TERM GOAL #2   Title The patient will improve Berg score from 41/56 up to 47/56 to demo decreasing risk for falls.   Baseline Pt scored 48/56 02/21/2015.   Time 8   Period Weeks   Status Achieved   PT LONG TERM GOAL #3   Title Pt will improve TUG score to less than or equal to 17 seconds for decreased fall risk.    Baseline Met at STG reassessment (see above).   Time 8   Period Weeks   Status Achieved   PT LONG TERM GOAL #4   Title The patient will  improve gait speed from 2.46 ft/sec up to 3.0 ft/sec to demo transition to "full community ambulator" classification of gait.   Baseline See above STG.   Time 8   Period Weeks   Status Partially Met            Plan - 03/15/2015 1222    Clinical Impression Statement The patient met all LTGs except for gait speed.  PT reviewed HEP and patient is doing well and feels she can continue post d/c.    Pt will benefit from skilled therapeutic intervention in order to improve on the following deficits Abnormal gait;Decreased balance;Decreased activity tolerance;Decreased mobility;Decreased endurance;Decreased strength;Postural dysfunction;Impaired flexibility;Difficulty walking;Decreased coordination   Rehab Potential Good   PT Frequency 1x / week   PT Duration --  one more visit   PT Treatment/Interventions Gait training;Balance training;Neuromuscular re-education;Stair training;Functional mobility training;Patient/family education;Therapeutic activities;ADLs/Self Care Home Management   PT Next Visit Plan discharge today.   Consulted and Agree with Plan of Care Patient          G-Codes - 2015-03-15 1230    Functional Assessment Tool Used Berg=48/56   Functional Limitation Mobility: Walking and moving around   Mobility: Walking and Moving Around Goal Status [phone]) At least 1 percent but less than 20 percent impaired, limited or restricted   Mobility: Walking and Moving Around Discharge Status [phone]) At least 1 percent but less than 20 percent impaired, limited or restricted      Problem List There are no active problems to display for this patient.   [place], [place] 15-Mar-2015, 12:30 PM  [facility] [address] [place], [place], [address] Phone: [phone]   Fax:  [phone]

## 2015-03-13 NOTE — Therapy (Signed)
[facility] [address] [place], [place], [address] Phone: [phone]   Fax:  [phone]  Patient Details  Name: Dawn Hampton MRN: [id number] Date of Birth: [birth year] Referring Provider:  No ref. provider found  Encounter Date: 03/13/2015  PHYSICAL THERAPY DISCHARGE SUMMARY  Visits from Start of Care: 10  Current functional level related to goals / functional outcomes:     PT Long Term Goals - 03/13/15 1201    PT LONG TERM GOAL #1   Title The patient will be indep with progression of HEP for post d/c.    Baseline Met on 03/13/2015   Time 8   Period Weeks   Status Achieved   PT LONG TERM GOAL #2   Title The patient will improve Berg score from 41/56 up to 47/56 to demo decreasing risk for falls.   Baseline Pt scored 48/56 02/21/2015.   Time 8   Period Weeks   Status Achieved   PT LONG TERM GOAL #3   Title Pt will improve TUG score to less than or equal to 17 seconds for decreased fall risk.    Baseline Met at STG reassessment (see above).   Time 8   Period Weeks   Status Achieved   PT LONG TERM GOAL #4   Title The patient will improve gait speed from 2.46 ft/sec up to 3.0 ft/sec to demo transition to "full community ambulator" classification of gait.   Baseline See above STG.   Time 8   Period Weeks   Status Partially Met        Remaining deficits: Occasional instability noted during gait activities. Decreased high level balance.   Education / Equipment: HEP, fall prevention.  Plan: Patient agrees to discharge.  Patient goals were partially met. Patient is being discharged due to meeting the stated rehab goals.  ?????     Thank you for the referral of this patient.           [place] , [place]  03/13/2015, 2:07 PM  [facility] [address] [place], [place], [address] Phone: [phone]   Fax:  [phone]

## 2015-04-03 DIAGNOSIS — H3532 Exudative age-related macular degeneration: Secondary | ICD-10-CM | POA: Diagnosis not present

## 2015-04-08 DIAGNOSIS — L738 Other specified follicular disorders: Secondary | ICD-10-CM | POA: Diagnosis not present

## 2015-04-09 DIAGNOSIS — H3532 Exudative age-related macular degeneration: Secondary | ICD-10-CM | POA: Diagnosis not present

## 2015-04-22 DIAGNOSIS — L309 Dermatitis, unspecified: Secondary | ICD-10-CM | POA: Diagnosis not present

## 2015-05-07 DIAGNOSIS — R21 Rash and other nonspecific skin eruption: Secondary | ICD-10-CM | POA: Diagnosis not present

## 2015-05-15 DIAGNOSIS — H3532 Exudative age-related macular degeneration: Secondary | ICD-10-CM | POA: Diagnosis not present

## 2015-05-28 DIAGNOSIS — H3532 Exudative age-related macular degeneration: Secondary | ICD-10-CM | POA: Diagnosis not present

## 2015-05-29 DIAGNOSIS — Q828 Other specified congenital malformations of skin: Secondary | ICD-10-CM | POA: Diagnosis not present

## 2015-06-19 ENCOUNTER — Encounter (HOSPITAL_COMMUNITY): Payer: Medicare Other

## 2015-06-20 ENCOUNTER — Other Ambulatory Visit (HOSPITAL_COMMUNITY): Payer: Self-pay

## 2015-06-21 ENCOUNTER — Encounter (HOSPITAL_COMMUNITY)
Admission: RE | Admit: 2015-06-21 | Discharge: 2015-06-21 | Disposition: A | Discharge status: Home / Self Care | Payer: Medicare Other | Source: Ambulatory Visit | Attending: Internal Medicine | Admitting: Internal Medicine

## 2015-06-21 DIAGNOSIS — M810 Age-related osteoporosis without current pathological fracture: Secondary | ICD-10-CM | POA: Diagnosis not present

## 2015-06-21 MED ORDER — DENOSUMAB 60 MG/ML ~~LOC~~ SOLN
60.0000 mg | Freq: Once | SUBCUTANEOUS | Status: AC
  Administered 2015-06-21: 60 mg via SUBCUTANEOUS
  Filled 2015-06-21: qty 1

## 2015-07-03 DIAGNOSIS — H353211 Exudative age-related macular degeneration, right eye, with active choroidal neovascularization: Secondary | ICD-10-CM | POA: Diagnosis not present

## 2015-07-10 DIAGNOSIS — H353221 Exudative age-related macular degeneration, left eye, with active choroidal neovascularization: Secondary | ICD-10-CM | POA: Diagnosis not present

## 2015-07-16 DIAGNOSIS — H353230 Exudative age-related macular degeneration, bilateral, stage unspecified: Secondary | ICD-10-CM | POA: Diagnosis not present

## 2015-07-16 DIAGNOSIS — H26493 Other secondary cataract, bilateral: Secondary | ICD-10-CM | POA: Diagnosis not present

## 2015-07-16 DIAGNOSIS — H43813 Vitreous degeneration, bilateral: Secondary | ICD-10-CM | POA: Diagnosis not present

## 2015-07-16 DIAGNOSIS — Z961 Presence of intraocular lens: Secondary | ICD-10-CM | POA: Diagnosis not present

## 2015-08-07 DIAGNOSIS — H353211 Exudative age-related macular degeneration, right eye, with active choroidal neovascularization: Secondary | ICD-10-CM | POA: Diagnosis not present

## 2015-08-13 DIAGNOSIS — H353221 Exudative age-related macular degeneration, left eye, with active choroidal neovascularization: Secondary | ICD-10-CM | POA: Diagnosis not present

## 2015-09-02 ENCOUNTER — Emergency Department (HOSPITAL_COMMUNITY)
Admission: EM | Admit: 2015-09-02 | Discharge: 2015-09-02 | Disposition: A | Discharge status: Home / Self Care | Payer: Medicare Other | Attending: Emergency Medicine | Admitting: Emergency Medicine

## 2015-09-02 ENCOUNTER — Encounter (HOSPITAL_COMMUNITY): Payer: Self-pay

## 2015-09-02 DIAGNOSIS — Z880 Allergy status to penicillin: Secondary | ICD-10-CM | POA: Insufficient documentation

## 2015-09-02 DIAGNOSIS — M810 Age-related osteoporosis without current pathological fracture: Secondary | ICD-10-CM | POA: Diagnosis not present

## 2015-09-02 DIAGNOSIS — D649 Anemia, unspecified: Secondary | ICD-10-CM | POA: Diagnosis not present

## 2015-09-02 DIAGNOSIS — Z8669 Personal history of other diseases of the nervous system and sense organs: Secondary | ICD-10-CM | POA: Diagnosis not present

## 2015-09-02 DIAGNOSIS — I10 Essential (primary) hypertension: Secondary | ICD-10-CM | POA: Insufficient documentation

## 2015-09-02 DIAGNOSIS — Z79899 Other long term (current) drug therapy: Secondary | ICD-10-CM | POA: Insufficient documentation

## 2015-09-02 DIAGNOSIS — Z8619 Personal history of other infectious and parasitic diseases: Secondary | ICD-10-CM | POA: Insufficient documentation

## 2015-09-02 DIAGNOSIS — N390 Urinary tract infection, site not specified: Secondary | ICD-10-CM | POA: Insufficient documentation

## 2015-09-02 DIAGNOSIS — J029 Acute pharyngitis, unspecified: Secondary | ICD-10-CM | POA: Diagnosis present

## 2015-09-02 DIAGNOSIS — Z792 Long term (current) use of antibiotics: Secondary | ICD-10-CM | POA: Insufficient documentation

## 2015-09-02 LAB — URINE MICROSCOPIC-ADD ON

## 2015-09-02 LAB — URINALYSIS, ROUTINE W REFLEX MICROSCOPIC
Bilirubin Urine: NEGATIVE
Glucose, UA: NEGATIVE mg/dL
KETONES UR: 15 mg/dL — AB
NITRITE: POSITIVE — AB
PROTEIN: 100 mg/dL — AB
Specific Gravity, Urine: 1.019 (ref 1.005–1.030)
pH: 5.5 (ref 5.0–8.0)

## 2015-09-02 LAB — RAPID STREP SCREEN (MED CTR MEBANE ONLY): STREPTOCOCCUS, GROUP A SCREEN (DIRECT): NEGATIVE

## 2015-09-02 MED ORDER — SULFAMETHOXAZOLE-TRIMETHOPRIM 800-160 MG PO TABS
1.0000 | ORAL_TABLET | Freq: Once | ORAL | Status: DC
  Filled 2015-09-02: qty 1

## 2015-09-02 MED ORDER — CEPHALEXIN 500 MG PO CAPS: 1000.0000 mg | ORAL_CAPSULE | Freq: Two times a day (BID) | ORAL | Status: DC

## 2015-09-02 MED ORDER — CEPHALEXIN 250 MG PO CAPS
1000.0000 mg | ORAL_CAPSULE | Freq: Once | ORAL | Status: AC
  Administered 2015-09-02: 1000 mg via ORAL
  Filled 2015-09-02: qty 4

## 2015-09-02 MED ORDER — SULFAMETHOXAZOLE-TRIMETHOPRIM 800-160 MG PO TABS: 1.0000 | ORAL_TABLET | Freq: Two times a day (BID) | ORAL | Status: DC

## 2015-09-02 NOTE — Discharge Instructions (Signed)

## 2015-09-02 NOTE — ED Provider Notes (Addendum)
CSN: [id number]     Arrival date & time 09/02/15  1128 History   First MD Initiated Contact with Patient 09/02/15 1331     Chief Complaint  Patient presents with  . Sore Throat     (Consider location/radiation/quality/duration/timing/severity/associated sxs/prior Treatment) HPI Patient reports on Sunday she had chills and did not feel well. She did not go to church due to these symptoms. She reports she is also noted a sore throat. She reports it feels both scratchy, dry and painful. She is not having any difficulty breathing. She denies cough or chest pain. Patient does report that yesterday evening she had to get up multiple times during the night to urinate. She reports normal she gets up several times but not that often. No associated abdominal pain or vomiting. The diarrhea. Patient reports she has had her flu shot this year. Past Medical History  Diagnosis Date  . Osteoporosis   . Hypertension   . Macular degeneration   . Anemia   . Abdominal mass, right lower quadrant   . Shingles     T8   Past Surgical History  Procedure Laterality Date  . Total abdominal hysterectomy w/ bilateral salpingoophorectomy  1975  . Pud, partial gastrectomy 1992    . Cataracts  2004   No family history on file. Social History  Substance Use Topics  . Smoking status: Never Smoker   . Smokeless tobacco: None  . Alcohol Use: None   OB History    No data available     Review of Systems  10 Systems reviewed and are negative for acute change except as noted in the HPI.   Allergies  Penicillins  Home Medications   Prior to Admission medications   Medication Sig Start Date End Date Taking? Authorizing Provider  amLODipine (NORVASC) 5 MG tablet Take 5 mg by mouth daily.   Yes Historical Provider, MD  beta carotene w/minerals (OCUVITE) tablet Take 1 tablet by mouth daily.   Yes Historical Provider, MD  calcium carbonate (TUMS - DOSED IN MG ELEMENTAL CALCIUM) 500 MG chewable tablet Chew 1  tablet by mouth 2 (two) times daily.    Yes Historical Provider, MD  iron polysaccharides (NIFEREX) 150 MG capsule Take 150 mg by mouth 2 (two) times daily.    Yes Historical Provider, MD  loratadine (CLARITIN) 10 MG tablet Take 10 mg by mouth daily.   Yes Historical Provider, MD  losartan (COZAAR) 100 MG tablet Take 100 mg by mouth daily.   Yes Historical Provider, MD  Polyethyl Glycol-Propyl Glycol (SYSTANE OP) Apply 1 drop to eye 2 (two) times daily.   Yes Historical Provider, MD  Vitamin D, Ergocalciferol, (DRISDOL) 50000 UNITS CAPS capsule Take 50,000 Units by mouth every 7 (seven) days.   Yes Historical Provider, MD  sulfamethoxazole-trimethoprim (BACTRIM DS,SEPTRA DS) 800-160 MG tablet Take 1 tablet by mouth 2 (two) times daily. 09/02/15   Charlesetta Shanks, MD   BP 153/51 mmHg  Pulse 75  Temp(Src) 97.9 F (36.6 C) (Oral)  Resp 18  SpO2 99% Physical Exam  Constitutional: She is oriented to person, place, and time. She appears well-developed and well-nourished.  HENT:  Head: Normocephalic and atraumatic.  Right Ear: External ear normal.  Left Ear: External ear normal.  Posterior oropharynx is widely patent. There are few vesicles and mild erythema present on the or tonsillar pillars. No exudates.  Eyes: EOM are normal. Pupils are equal, round, and reactive to light.  Neck: Neck supple. No tracheal deviation present.  Cardiovascular: Normal rate, regular rhythm, normal heart sounds and intact distal pulses.   Pulmonary/Chest: Effort normal and breath sounds normal. No stridor.  Abdominal: Soft. Bowel sounds are normal. She exhibits no distension. There is no tenderness.  Musculoskeletal: Normal range of motion. She exhibits no edema.  Lymphadenopathy:    She has no cervical adenopathy.  Neurological: She is alert and oriented to person, place, and time. She has normal strength. Coordination normal. GCS eye subscore is 4. GCS verbal subscore is 5. GCS motor subscore is 6.  Skin: Skin is  warm, dry and intact.  Psychiatric: She has a normal mood and affect.    ED Course  Procedures (including critical care time) Labs Review Labs Reviewed  URINALYSIS, ROUTINE W REFLEX MICROSCOPIC (NOT AT [facility]) - Abnormal; Notable for the following:    Color, Urine AMBER (*)    APPearance TURBID (*)    Hgb urine dipstick MODERATE (*)    Ketones, ur 15 (*)    Protein, ur 100 (*)    Nitrite POSITIVE (*)    Leukocytes, UA LARGE (*)    All other components within normal limits  URINE MICROSCOPIC-ADD ON - Abnormal; Notable for the following:    Squamous Epithelial / LPF 0-5 (*)    Bacteria, UA MANY (*)    All other components within normal limits  RAPID STREP SCREEN (NOT AT [facility])  CULTURE, GROUP A STREP  URINE CULTURE    Imaging Review No results found. I have personally reviewed and evaluated these images and lab results as part of my medical decision-making.   EKG Interpretation None      MDM   Final diagnoses:  UTI (lower urinary tract infection)   Patient presenting complaint was chills with sore throat. Symptoms were suggestive of URI. However on review of systems patient identified urinary frequency. Urine is grossly positive. At this time patient will be treated for UTI. She is nontoxic and alert. Vital signs are stable. This time outpatient treatment will be initiated with signs and symptoms for which to return reviewed. After Bactrim was ordered, patient reported allergy to Bactrim. She stated rash was allergy. She reported her listed penicillin allergy was from a very very long time ago. She reports her arm got red and swollen after she had gotten a shot. He reports that she's had multiple antibiotics since then and not had another reaction that she can recall. At this time she will be started on Keflex.   Charlesetta Shanks, MD 09/02/15 1657  Charlesetta Shanks, MD 09/02/15 [phone]

## 2015-09-02 NOTE — ED Notes (Signed)
Pt attempted to collect sample, peed all over the toilet and self, will try again after pt has had something to drink.

## 2015-09-02 NOTE — ED Notes (Signed)
patient here with sore throat since Saturday, no other associated symptoms

## 2015-09-04 LAB — CULTURE, GROUP A STREP: STREP A CULTURE: NEGATIVE

## 2015-09-05 LAB — URINE CULTURE

## 2015-09-06 ENCOUNTER — Telehealth (HOSPITAL_BASED_OUTPATIENT_CLINIC_OR_DEPARTMENT_OTHER): Payer: Self-pay | Admitting: Emergency Medicine

## 2015-09-06 DIAGNOSIS — N390 Urinary tract infection, site not specified: Secondary | ICD-10-CM | POA: Diagnosis not present

## 2015-09-06 DIAGNOSIS — M1990 Unspecified osteoarthritis, unspecified site: Secondary | ICD-10-CM | POA: Diagnosis not present

## 2015-09-06 DIAGNOSIS — Z6825 Body mass index (BMI) 25.0-25.9, adult: Secondary | ICD-10-CM | POA: Diagnosis not present

## 2015-09-06 NOTE — Telephone Encounter (Signed)
Post ED Visit - Positive Culture Follow-up  Culture report reviewed by antimicrobial stewardship pharmacist:  []  Elenor Quinones, Pharm.D. []  Heide Guile, Pharm.D., BCPS []  Parks Neptune, Pharm.D. []  Alycia Rossetti, Pharm.D., BCPS []  [place], [place].D., BCPS, AAHIVP []  Legrand Como, Pharm.D., BCPS, AAHIVP []  Milus Glazier, Pharm.D. []  Rob Evette Doffing, Pharm.D.  Positive urine culture E. coli Treated with bactrim DS and cephalexin, organism sensitive to the same and no further patient follow-up is required at this time.  Hazle Nordmann 09/06/2015, 10:13 AM

## 2015-09-11 DIAGNOSIS — H353133 Nonexudative age-related macular degeneration, bilateral, advanced atrophic without subfoveal involvement: Secondary | ICD-10-CM | POA: Diagnosis not present

## 2015-09-11 DIAGNOSIS — H353221 Exudative age-related macular degeneration, left eye, with active choroidal neovascularization: Secondary | ICD-10-CM | POA: Diagnosis not present

## 2015-09-11 DIAGNOSIS — H353211 Exudative age-related macular degeneration, right eye, with active choroidal neovascularization: Secondary | ICD-10-CM | POA: Diagnosis not present

## 2015-09-24 DIAGNOSIS — H353133 Nonexudative age-related macular degeneration, bilateral, advanced atrophic without subfoveal involvement: Secondary | ICD-10-CM | POA: Diagnosis not present

## 2015-09-24 DIAGNOSIS — H353221 Exudative age-related macular degeneration, left eye, with active choroidal neovascularization: Secondary | ICD-10-CM | POA: Diagnosis not present

## 2015-09-24 DIAGNOSIS — H353211 Exudative age-related macular degeneration, right eye, with active choroidal neovascularization: Secondary | ICD-10-CM | POA: Diagnosis not present

## 2015-10-21 DIAGNOSIS — H353221 Exudative age-related macular degeneration, left eye, with active choroidal neovascularization: Secondary | ICD-10-CM | POA: Diagnosis not present

## 2015-10-21 DIAGNOSIS — H353211 Exudative age-related macular degeneration, right eye, with active choroidal neovascularization: Secondary | ICD-10-CM | POA: Diagnosis not present

## 2015-11-05 DIAGNOSIS — H353221 Exudative age-related macular degeneration, left eye, with active choroidal neovascularization: Secondary | ICD-10-CM | POA: Diagnosis not present

## 2015-11-07 DIAGNOSIS — M810 Age-related osteoporosis without current pathological fracture: Secondary | ICD-10-CM | POA: Diagnosis not present

## 2015-11-07 DIAGNOSIS — R8299 Other abnormal findings in urine: Secondary | ICD-10-CM | POA: Diagnosis not present

## 2015-11-07 DIAGNOSIS — E784 Other hyperlipidemia: Secondary | ICD-10-CM | POA: Diagnosis not present

## 2015-11-07 DIAGNOSIS — I10 Essential (primary) hypertension: Secondary | ICD-10-CM | POA: Diagnosis not present

## 2015-11-07 DIAGNOSIS — N390 Urinary tract infection, site not specified: Secondary | ICD-10-CM | POA: Diagnosis not present

## 2015-11-14 DIAGNOSIS — M545 Low back pain: Secondary | ICD-10-CM | POA: Diagnosis not present

## 2015-11-14 DIAGNOSIS — I10 Essential (primary) hypertension: Secondary | ICD-10-CM | POA: Diagnosis not present

## 2015-11-14 DIAGNOSIS — R252 Cramp and spasm: Secondary | ICD-10-CM | POA: Diagnosis not present

## 2015-11-14 DIAGNOSIS — M65341 Trigger finger, right ring finger: Secondary | ICD-10-CM | POA: Diagnosis not present

## 2015-11-14 DIAGNOSIS — N390 Urinary tract infection, site not specified: Secondary | ICD-10-CM | POA: Diagnosis not present

## 2015-11-14 DIAGNOSIS — E784 Other hyperlipidemia: Secondary | ICD-10-CM | POA: Diagnosis not present

## 2015-11-14 DIAGNOSIS — H3530 Unspecified macular degeneration: Secondary | ICD-10-CM | POA: Diagnosis not present

## 2015-11-14 DIAGNOSIS — D6489 Other specified anemias: Secondary | ICD-10-CM | POA: Diagnosis not present

## 2015-11-14 DIAGNOSIS — Z1389 Encounter for screening for other disorder: Secondary | ICD-10-CM | POA: Diagnosis not present

## 2015-11-14 DIAGNOSIS — M810 Age-related osteoporosis without current pathological fracture: Secondary | ICD-10-CM | POA: Diagnosis not present

## 2015-11-14 DIAGNOSIS — Z0000 Encounter for general adult medical examination without abnormal findings: Secondary | ICD-10-CM | POA: Diagnosis not present

## 2015-11-14 DIAGNOSIS — Z6825 Body mass index (BMI) 25.0-25.9, adult: Secondary | ICD-10-CM | POA: Diagnosis not present

## 2015-11-28 DIAGNOSIS — M79644 Pain in right finger(s): Secondary | ICD-10-CM | POA: Diagnosis not present

## 2015-11-28 DIAGNOSIS — M25341 Other instability, right hand: Secondary | ICD-10-CM | POA: Diagnosis not present

## 2015-12-04 DIAGNOSIS — H353211 Exudative age-related macular degeneration, right eye, with active choroidal neovascularization: Secondary | ICD-10-CM | POA: Diagnosis not present

## 2015-12-04 DIAGNOSIS — H353133 Nonexudative age-related macular degeneration, bilateral, advanced atrophic without subfoveal involvement: Secondary | ICD-10-CM | POA: Diagnosis not present

## 2015-12-17 DIAGNOSIS — H353221 Exudative age-related macular degeneration, left eye, with active choroidal neovascularization: Secondary | ICD-10-CM | POA: Diagnosis not present

## 2015-12-19 DIAGNOSIS — S63614D Unspecified sprain of right ring finger, subsequent encounter: Secondary | ICD-10-CM | POA: Diagnosis not present

## 2016-01-09 DIAGNOSIS — S63654D Sprain of metacarpophalangeal joint of right ring finger, subsequent encounter: Secondary | ICD-10-CM | POA: Diagnosis not present

## 2016-01-22 DIAGNOSIS — H353133 Nonexudative age-related macular degeneration, bilateral, advanced atrophic without subfoveal involvement: Secondary | ICD-10-CM | POA: Diagnosis not present

## 2016-01-22 DIAGNOSIS — H353211 Exudative age-related macular degeneration, right eye, with active choroidal neovascularization: Secondary | ICD-10-CM | POA: Diagnosis not present

## 2016-01-22 DIAGNOSIS — H353221 Exudative age-related macular degeneration, left eye, with active choroidal neovascularization: Secondary | ICD-10-CM | POA: Diagnosis not present

## 2016-01-22 DIAGNOSIS — H35352 Cystoid macular degeneration, left eye: Secondary | ICD-10-CM | POA: Diagnosis not present

## 2016-02-12 DIAGNOSIS — H353211 Exudative age-related macular degeneration, right eye, with active choroidal neovascularization: Secondary | ICD-10-CM | POA: Diagnosis not present

## 2016-02-12 DIAGNOSIS — H353221 Exudative age-related macular degeneration, left eye, with active choroidal neovascularization: Secondary | ICD-10-CM | POA: Diagnosis not present

## 2016-03-11 DIAGNOSIS — H353211 Exudative age-related macular degeneration, right eye, with active choroidal neovascularization: Secondary | ICD-10-CM | POA: Diagnosis not present

## 2016-03-25 DIAGNOSIS — H353221 Exudative age-related macular degeneration, left eye, with active choroidal neovascularization: Secondary | ICD-10-CM | POA: Diagnosis not present

## 2016-03-25 DIAGNOSIS — H353211 Exudative age-related macular degeneration, right eye, with active choroidal neovascularization: Secondary | ICD-10-CM | POA: Diagnosis not present

## 2016-04-20 DIAGNOSIS — I10 Essential (primary) hypertension: Secondary | ICD-10-CM | POA: Diagnosis not present

## 2016-04-20 DIAGNOSIS — D6489 Other specified anemias: Secondary | ICD-10-CM | POA: Diagnosis not present

## 2016-04-20 DIAGNOSIS — R197 Diarrhea, unspecified: Secondary | ICD-10-CM | POA: Diagnosis not present

## 2016-04-20 DIAGNOSIS — M545 Low back pain: Secondary | ICD-10-CM | POA: Diagnosis not present

## 2016-04-20 DIAGNOSIS — Z6825 Body mass index (BMI) 25.0-25.9, adult: Secondary | ICD-10-CM | POA: Diagnosis not present

## 2016-04-29 DIAGNOSIS — H353211 Exudative age-related macular degeneration, right eye, with active choroidal neovascularization: Secondary | ICD-10-CM | POA: Diagnosis not present

## 2016-05-06 DIAGNOSIS — H353221 Exudative age-related macular degeneration, left eye, with active choroidal neovascularization: Secondary | ICD-10-CM | POA: Diagnosis not present

## 2016-05-14 DIAGNOSIS — R197 Diarrhea, unspecified: Secondary | ICD-10-CM | POA: Diagnosis not present

## 2016-05-14 DIAGNOSIS — I10 Essential (primary) hypertension: Secondary | ICD-10-CM | POA: Diagnosis not present

## 2016-05-14 DIAGNOSIS — Z6825 Body mass index (BMI) 25.0-25.9, adult: Secondary | ICD-10-CM | POA: Diagnosis not present

## 2016-05-20 ENCOUNTER — Other Ambulatory Visit: Payer: Self-pay | Admitting: Internal Medicine

## 2016-05-20 DIAGNOSIS — Z1231 Encounter for screening mammogram for malignant neoplasm of breast: Secondary | ICD-10-CM

## 2016-05-27 ENCOUNTER — Ambulatory Visit
Admission: RE | Admit: 2016-05-27 | Discharge: 2016-05-27 | Disposition: A | Discharge status: Home / Self Care | Payer: Medicare Other | Source: Ambulatory Visit | Attending: Internal Medicine | Admitting: Internal Medicine

## 2016-05-27 DIAGNOSIS — Z1231 Encounter for screening mammogram for malignant neoplasm of breast: Secondary | ICD-10-CM | POA: Diagnosis not present

## 2016-05-28 ENCOUNTER — Other Ambulatory Visit (HOSPITAL_COMMUNITY): Payer: Self-pay | Admitting: *Deleted

## 2016-05-29 ENCOUNTER — Ambulatory Visit (HOSPITAL_COMMUNITY)
Admission: RE | Admit: 2016-05-29 | Discharge: 2016-05-29 | Disposition: A | Discharge status: Home / Self Care | Payer: Medicare Other | Source: Ambulatory Visit | Attending: Internal Medicine | Admitting: Internal Medicine

## 2016-05-29 ENCOUNTER — Other Ambulatory Visit: Payer: Self-pay | Admitting: Internal Medicine

## 2016-05-29 DIAGNOSIS — M810 Age-related osteoporosis without current pathological fracture: Secondary | ICD-10-CM | POA: Diagnosis not present

## 2016-05-29 DIAGNOSIS — R928 Other abnormal and inconclusive findings on diagnostic imaging of breast: Secondary | ICD-10-CM

## 2016-05-29 MED ORDER — DENOSUMAB 60 MG/ML ~~LOC~~ SOLN
60.0000 mg | Freq: Once | SUBCUTANEOUS | Status: AC
  Administered 2016-05-29: 60 mg via SUBCUTANEOUS
  Filled 2016-05-29: qty 1

## 2016-06-05 ENCOUNTER — Ambulatory Visit
Admission: RE | Admit: 2016-06-05 | Discharge: 2016-06-05 | Disposition: A | Discharge status: Home / Self Care | Payer: Medicare Other | Source: Ambulatory Visit | Attending: Internal Medicine | Admitting: Internal Medicine

## 2016-06-05 DIAGNOSIS — R928 Other abnormal and inconclusive findings on diagnostic imaging of breast: Secondary | ICD-10-CM

## 2016-06-05 DIAGNOSIS — R921 Mammographic calcification found on diagnostic imaging of breast: Secondary | ICD-10-CM | POA: Diagnosis not present

## 2016-06-12 DIAGNOSIS — Z6824 Body mass index (BMI) 24.0-24.9, adult: Secondary | ICD-10-CM | POA: Diagnosis not present

## 2016-06-12 DIAGNOSIS — R197 Diarrhea, unspecified: Secondary | ICD-10-CM | POA: Diagnosis not present

## 2016-06-18 DIAGNOSIS — H353211 Exudative age-related macular degeneration, right eye, with active choroidal neovascularization: Secondary | ICD-10-CM | POA: Diagnosis not present

## 2016-06-22 DIAGNOSIS — H353221 Exudative age-related macular degeneration, left eye, with active choroidal neovascularization: Secondary | ICD-10-CM | POA: Diagnosis not present

## 2016-07-20 DIAGNOSIS — H26493 Other secondary cataract, bilateral: Secondary | ICD-10-CM | POA: Diagnosis not present

## 2016-07-20 DIAGNOSIS — H353230 Exudative age-related macular degeneration, bilateral, stage unspecified: Secondary | ICD-10-CM | POA: Diagnosis not present

## 2016-07-20 DIAGNOSIS — H52203 Unspecified astigmatism, bilateral: Secondary | ICD-10-CM | POA: Diagnosis not present

## 2016-07-20 DIAGNOSIS — H43813 Vitreous degeneration, bilateral: Secondary | ICD-10-CM | POA: Diagnosis not present

## 2016-07-20 DIAGNOSIS — D485 Neoplasm of uncertain behavior of skin: Secondary | ICD-10-CM | POA: Diagnosis not present

## 2016-08-04 DIAGNOSIS — H353211 Exudative age-related macular degeneration, right eye, with active choroidal neovascularization: Secondary | ICD-10-CM | POA: Diagnosis not present

## 2016-08-12 DIAGNOSIS — H353221 Exudative age-related macular degeneration, left eye, with active choroidal neovascularization: Secondary | ICD-10-CM | POA: Diagnosis not present

## 2016-09-22 DIAGNOSIS — H353221 Exudative age-related macular degeneration, left eye, with active choroidal neovascularization: Secondary | ICD-10-CM | POA: Diagnosis not present

## 2016-09-22 DIAGNOSIS — H353211 Exudative age-related macular degeneration, right eye, with active choroidal neovascularization: Secondary | ICD-10-CM | POA: Diagnosis not present

## 2016-09-22 DIAGNOSIS — H353133 Nonexudative age-related macular degeneration, bilateral, advanced atrophic without subfoveal involvement: Secondary | ICD-10-CM | POA: Diagnosis not present

## 2016-09-22 DIAGNOSIS — H35352 Cystoid macular degeneration, left eye: Secondary | ICD-10-CM | POA: Diagnosis not present

## 2016-09-30 DIAGNOSIS — H353221 Exudative age-related macular degeneration, left eye, with active choroidal neovascularization: Secondary | ICD-10-CM | POA: Diagnosis not present

## 2016-11-11 DIAGNOSIS — H353211 Exudative age-related macular degeneration, right eye, with active choroidal neovascularization: Secondary | ICD-10-CM | POA: Diagnosis not present

## 2016-11-17 DIAGNOSIS — D485 Neoplasm of uncertain behavior of skin: Secondary | ICD-10-CM | POA: Diagnosis not present

## 2016-11-23 DIAGNOSIS — H353221 Exudative age-related macular degeneration, left eye, with active choroidal neovascularization: Secondary | ICD-10-CM | POA: Diagnosis not present

## 2016-11-24 DIAGNOSIS — N390 Urinary tract infection, site not specified: Secondary | ICD-10-CM | POA: Diagnosis not present

## 2016-11-24 DIAGNOSIS — E784 Other hyperlipidemia: Secondary | ICD-10-CM | POA: Diagnosis not present

## 2016-11-24 DIAGNOSIS — R8299 Other abnormal findings in urine: Secondary | ICD-10-CM | POA: Diagnosis not present

## 2016-11-24 DIAGNOSIS — I10 Essential (primary) hypertension: Secondary | ICD-10-CM | POA: Diagnosis not present

## 2016-11-24 DIAGNOSIS — M810 Age-related osteoporosis without current pathological fracture: Secondary | ICD-10-CM | POA: Diagnosis not present

## 2016-12-01 DIAGNOSIS — D6489 Other specified anemias: Secondary | ICD-10-CM | POA: Diagnosis not present

## 2016-12-01 DIAGNOSIS — Z0000 Encounter for general adult medical examination without abnormal findings: Secondary | ICD-10-CM | POA: Diagnosis not present

## 2016-12-01 DIAGNOSIS — H3530 Unspecified macular degeneration: Secondary | ICD-10-CM | POA: Diagnosis not present

## 2016-12-01 DIAGNOSIS — R2681 Unsteadiness on feet: Secondary | ICD-10-CM | POA: Diagnosis not present

## 2016-12-01 DIAGNOSIS — M545 Low back pain: Secondary | ICD-10-CM | POA: Diagnosis not present

## 2016-12-01 DIAGNOSIS — Z1389 Encounter for screening for other disorder: Secondary | ICD-10-CM | POA: Diagnosis not present

## 2016-12-01 DIAGNOSIS — M1990 Unspecified osteoarthritis, unspecified site: Secondary | ICD-10-CM | POA: Diagnosis not present

## 2016-12-01 DIAGNOSIS — I10 Essential (primary) hypertension: Secondary | ICD-10-CM | POA: Diagnosis not present

## 2016-12-01 DIAGNOSIS — Z6824 Body mass index (BMI) 24.0-24.9, adult: Secondary | ICD-10-CM | POA: Diagnosis not present

## 2016-12-01 DIAGNOSIS — R1084 Generalized abdominal pain: Secondary | ICD-10-CM | POA: Diagnosis not present

## 2016-12-01 DIAGNOSIS — E784 Other hyperlipidemia: Secondary | ICD-10-CM | POA: Diagnosis not present

## 2016-12-01 DIAGNOSIS — R252 Cramp and spasm: Secondary | ICD-10-CM | POA: Diagnosis not present

## 2016-12-04 ENCOUNTER — Other Ambulatory Visit: Payer: Self-pay | Admitting: Internal Medicine

## 2016-12-04 DIAGNOSIS — R1011 Right upper quadrant pain: Secondary | ICD-10-CM

## 2016-12-11 ENCOUNTER — Other Ambulatory Visit (HOSPITAL_COMMUNITY): Payer: Self-pay | Admitting: *Deleted

## 2016-12-11 ENCOUNTER — Other Ambulatory Visit: Payer: Medicare Other

## 2016-12-14 ENCOUNTER — Ambulatory Visit (HOSPITAL_COMMUNITY)
Admission: RE | Admit: 2016-12-14 | Discharge: 2016-12-14 | Disposition: A | Discharge status: Home / Self Care | Payer: Medicare Other | Source: Ambulatory Visit | Attending: Internal Medicine | Admitting: Internal Medicine

## 2016-12-14 DIAGNOSIS — M810 Age-related osteoporosis without current pathological fracture: Secondary | ICD-10-CM | POA: Insufficient documentation

## 2016-12-14 MED ORDER — DENOSUMAB 60 MG/ML ~~LOC~~ SOLN
60.0000 mg | Freq: Once | SUBCUTANEOUS | Status: AC
  Administered 2016-12-14: 60 mg via SUBCUTANEOUS
  Filled 2016-12-14: qty 1

## 2016-12-18 ENCOUNTER — Ambulatory Visit
Admission: RE | Admit: 2016-12-18 | Discharge: 2016-12-18 | Disposition: A | Discharge status: Home / Self Care | Payer: Medicare Other | Source: Ambulatory Visit | Attending: Internal Medicine | Admitting: Internal Medicine

## 2016-12-18 DIAGNOSIS — R1011 Right upper quadrant pain: Secondary | ICD-10-CM

## 2016-12-29 DIAGNOSIS — H353211 Exudative age-related macular degeneration, right eye, with active choroidal neovascularization: Secondary | ICD-10-CM | POA: Diagnosis not present

## 2016-12-29 DIAGNOSIS — H353221 Exudative age-related macular degeneration, left eye, with active choroidal neovascularization: Secondary | ICD-10-CM | POA: Diagnosis not present

## 2017-01-08 ENCOUNTER — Emergency Department (HOSPITAL_COMMUNITY): Payer: Medicare Other

## 2017-01-08 ENCOUNTER — Emergency Department (HOSPITAL_COMMUNITY)
Admission: EM | Admit: 2017-01-08 | Discharge: 2017-01-08 | Disposition: A | Discharge status: Home / Self Care | Payer: Medicare Other | Attending: Emergency Medicine | Admitting: Emergency Medicine

## 2017-01-08 ENCOUNTER — Encounter (HOSPITAL_COMMUNITY): Payer: Self-pay

## 2017-01-08 DIAGNOSIS — I10 Essential (primary) hypertension: Secondary | ICD-10-CM | POA: Insufficient documentation

## 2017-01-08 DIAGNOSIS — Y999 Unspecified external cause status: Secondary | ICD-10-CM | POA: Diagnosis not present

## 2017-01-08 DIAGNOSIS — S50311A Abrasion of right elbow, initial encounter: Secondary | ICD-10-CM | POA: Insufficient documentation

## 2017-01-08 DIAGNOSIS — Y939 Activity, unspecified: Secondary | ICD-10-CM | POA: Diagnosis not present

## 2017-01-08 DIAGNOSIS — S51011A Laceration without foreign body of right elbow, initial encounter: Secondary | ICD-10-CM | POA: Diagnosis not present

## 2017-01-08 DIAGNOSIS — Z79899 Other long term (current) drug therapy: Secondary | ICD-10-CM | POA: Diagnosis not present

## 2017-01-08 DIAGNOSIS — S299XXA Unspecified injury of thorax, initial encounter: Secondary | ICD-10-CM | POA: Diagnosis not present

## 2017-01-08 DIAGNOSIS — W1830XA Fall on same level, unspecified, initial encounter: Secondary | ICD-10-CM | POA: Insufficient documentation

## 2017-01-08 DIAGNOSIS — Z23 Encounter for immunization: Secondary | ICD-10-CM | POA: Diagnosis not present

## 2017-01-08 DIAGNOSIS — Y929 Unspecified place or not applicable: Secondary | ICD-10-CM | POA: Insufficient documentation

## 2017-01-08 DIAGNOSIS — W19XXXA Unspecified fall, initial encounter: Secondary | ICD-10-CM

## 2017-01-08 DIAGNOSIS — S59901A Unspecified injury of right elbow, initial encounter: Secondary | ICD-10-CM | POA: Diagnosis present

## 2017-01-08 MED ORDER — TETANUS-DIPHTH-ACELL PERTUSSIS 5-2.5-18.5 LF-MCG/0.5 IM SUSP
0.5000 mL | Freq: Once | INTRAMUSCULAR | Status: AC
  Administered 2017-01-08: 0.5 mL via INTRAMUSCULAR
  Filled 2017-01-08: qty 0.5

## 2017-01-08 NOTE — ED Triage Notes (Signed)
Pt was outside planting flowers and the flower pot slid causing her to fall. She has small laceration to the right side of her face and to her right elbow. Pt denies LOC. She is alert and oriented. Denies dizziness at anytime.

## 2017-01-08 NOTE — ED Provider Notes (Signed)
[facility] DEPT Provider Note   CSN: [id number] Arrival date & time: 01/08/17  1807     History   Chief Complaint Chief Complaint  Patient presents with  . Fall    HPI Jaleea Ward [name] is a 83 y.o. female.  The history is provided by the patient and medical records.  Fall    83 y.o. F with hx of anemia, HTN, macular degeneration, osteoporosis, hx of shingles, presenting to the ED following a fall.  Patient reports she was planting flowers at her house today when a flower pot she was filling slipped which caused her to fall onto her right side.  States she brushed her head against the ground (concrete stones with grass surrounding) and fell onto her right elbow.  No LOC. States she was able to get up and call her daughter for help.  States she has been walking around since the incident without issue.  She denies any dizziness, confusion, trouble walking, Blurred vision, numbness, weakness. Daughter reports she has been acting her baseline. She is not on anticoagulation. Patient states mainly she only has pain in her right elbow from the fall. She does have a small overlying abrasion as well as small abrasion to her right temple. She denies any significant headache or neck pain. No back pain.  Patient's daughter reported some chest wall pain-- states she fell in her bathroom a few weeks ago and has been having some aching pains there.  States they are better now.  She was never evaluated for this prior.  No known cardiac hx.  Past Medical History:  Diagnosis Date  . Abdominal mass, right lower quadrant   . Anemia   . Hypertension   . Macular degeneration   . Osteoporosis   . Shingles    T8    There are no active problems to display for this patient.   Past Surgical History:  Procedure Laterality Date  . cataracts  2004  . PUD, partial gastrectomy 1992    . TOTAL ABDOMINAL HYSTERECTOMY W/ BILATERAL SALPINGOOPHORECTOMY  1975    OB History    No data available        Home Medications    Prior to Admission medications   Medication Sig Start Date End Date Taking? Authorizing Provider  amLODipine (NORVASC) 5 MG tablet Take 5 mg by mouth daily.    [provider]  beta carotene w/minerals (OCUVITE) tablet Take 1 tablet by mouth daily.    [provider]  calcium carbonate (TUMS - DOSED IN MG ELEMENTAL CALCIUM) 500 MG chewable tablet Chew 1 tablet by mouth 2 (two) times daily.     [provider]  cephALEXin (KEFLEX) 500 MG capsule Take 2 capsules (1,000 mg total) by mouth 2 (two) times daily. 09/02/15   Charlesetta Shanks, MD  iron polysaccharides (NIFEREX) 150 MG capsule Take 150 mg by mouth 2 (two) times daily.     [provider]  loperamide (IMODIUM A-D) 2 MG tablet Take 2 mg by mouth daily at 2 am.    [provider]  loratadine (CLARITIN) 10 MG tablet Take 10 mg by mouth daily.    [provider]  losartan (COZAAR) 100 MG tablet Take 100 mg by mouth daily.    [provider]  Polyethyl Glycol-Propyl Glycol (SYSTANE OP) Apply 1 drop to eye 2 (two) times daily.    [provider]  Vitamin D, Ergocalciferol, (DRISDOL) 50000 UNITS CAPS capsule Take 50,000 Units by mouth every 7 (seven)  days.    [provider]    Family History History reviewed. No pertinent family history.  Social History Social History  Substance Use Topics  . Smoking status: Never Smoker  . Smokeless tobacco: Never Used  . Alcohol use No     Allergies   Sulfa antibiotics and Penicillins   Review of Systems Review of Systems  Musculoskeletal: Positive for arthralgias.  Skin: Positive for wound.  All other systems reviewed and are negative.    Physical Exam Updated Vital Signs BP (!) 165/74   Pulse 78   Temp 98.6 F (37 C) (Oral)   Resp 16   Ht 4\' 11"  (1.499 m)   Wt 55.8 kg   SpO2 99%   BMI 24.84 kg/m   Physical Exam  Constitutional: She is oriented to person, place, and time.  She appears well-developed and well-nourished.  HENT:  Head: Normocephalic and atraumatic.  Mouth/Throat: Oropharynx is clear and moist.  Abrasion of right temple without active bleeding, there is no associated hematoma or facial deformities  Eyes: Conjunctivae and EOM are normal. Pupils are equal, round, and reactive to light.  Pupils symmetric and reactive bilaterally, EOMs fully intact  Neck: Normal range of motion.  Cardiovascular: Normal rate, regular rhythm and normal heart sounds.   Pulmonary/Chest: Effort normal and breath sounds normal.  Abdominal: Soft. Bowel sounds are normal.  Musculoskeletal: Normal range of motion.       Cervical back: Normal.  C/T/L spine non-tender Spine is kyphotic at baseline Minor abrasions to both knees which are superficial and non-tender; full ROM maintained, ambulatory with steady gait  Neurological: She is alert and oriented to person, place, and time.  AAOx3, answering questions and following commands appropriately; equal strength UE and LE bilaterally; CN grossly intact; moves all extremities appropriately without ataxia; no focal neuro deficits or facial asymmetry appreciated  Skin: Skin is warm and dry.  Psychiatric: She has a normal mood and affect.  Nursing note and vitals reviewed.    ED Treatments / Results  Labs (all labs ordered are listed, but only abnormal results are displayed) Labs Reviewed - No data to display  EKG  EKG Interpretation None       Radiology Dg Chest 2 View  Result Date: 01/08/2017 CLINICAL DATA:  Status post fall, with concern for chest injury. Initial encounter. EXAM: CHEST  2 VIEW COMPARISON:  Chest radiograph performed 12/26/2008 FINDINGS: The lungs are hypoexpanded. There is elevation of the right hemidiaphragm. No definite pleural effusion or pneumothorax is seen. The heart is borderline normal in size. No acute osseous abnormalities are identified. Postoperative change is noted about the  gastroesophageal junction. IMPRESSION: Lungs hypoexpanded, with elevation of the right hemidiaphragm. No acute cardiopulmonary process seen. No displaced rib fractures identified. Electronically Signed   By: Garald Balding M.D.   On: 01/08/2017 21:01   Dg Elbow Complete Right  Result Date: 01/08/2017 CLINICAL DATA:  Status post fall, with laceration at the right elbow. Initial encounter. EXAM: RIGHT ELBOW - COMPLETE 3+ VIEW COMPARISON:  None. FINDINGS: There is no evidence of fracture or dislocation. The visualized joint spaces are preserved. No significant joint effusion is identified. The known soft tissue laceration is not well characterized. No radiopaque foreign bodies are seen. IMPRESSION: No evidence of fracture or dislocation. Electronically Signed   By: Garald Balding M.D.   On: 01/08/2017 21:00    Procedures Procedures (including critical care time)  Medications Ordered in ED Medications  Tdap (BOOSTRIX) injection 0.5 mL (  0.5 mLs Intramuscular Given 01/08/17 2155)     Initial Impression / Assessment and Plan / ED Course  I have reviewed the triage vital signs and the nursing notes.  Pertinent labs & imaging results that were available during my care of the patient were reviewed by me and considered in my medical decision making (see chart for details).  83 year old female here after a mechanical fall at home while planting flowers. She did strike her head against the ground but did not lose consciousness. She was able to get up and call her daughter. Here she is awake, alert, oriented to her baseline. She remains ambulatory with steady gait here in ED. She is not currently on anticoagulation. Denies any significant headache, nausea, vomiting, dizziness or confusion. She has minor abrasions to her right temple, right elbow, and bilateral knees. She only complains of pain in the right elbow. Daughter did report some chest wall pain for the past 2 weeks after she fell getting out of the  shower and landed against the countertop. No deformities on exam.  She did not have this evaluated.   X-rays of the elbow and chest obtained here, no acute bony findings. Given patient's reassuring exam, signs of only minor injury, no anticoagulant use, and no current neurologic symptoms do not feel she needs emergent head CT at this time. Wounds were cleansed and dressed. Tetanus was updated. Feel she is stable for discharge.  Will have her follow-up with her primary care doctor for any ongoing issues.  Discussed plan with patient and daughter, they both acknowledged understanding and agreed with plan of care.  Return precautions given for new or worsening symptoms.  Patient seen and evaluated with attending physician, Dr Rex Kras.  Final Clinical Impressions(s) / ED Diagnoses   Final diagnoses:  Fall, initial encounter  Abrasion of right elbow, initial encounter    New Prescriptions New Prescriptions   No medications on file     Kathryne Hitch 01/08/17 2232    Little, Wenda Overland, MD 01/09/17 1505

## 2017-01-08 NOTE — ED Notes (Signed)
Non-stick dressing and bacitracin per pt request placed to R side of face and R elbow.

## 2017-01-08 NOTE — Discharge Instructions (Signed)
Keep wounds clean and dry. Follow-up with your doctor for any ongoing issues. Return here for new concerns.

## 2017-01-14 DIAGNOSIS — M810 Age-related osteoporosis without current pathological fracture: Secondary | ICD-10-CM | POA: Diagnosis not present

## 2017-01-14 DIAGNOSIS — M1990 Unspecified osteoarthritis, unspecified site: Secondary | ICD-10-CM | POA: Diagnosis not present

## 2017-01-14 DIAGNOSIS — I10 Essential (primary) hypertension: Secondary | ICD-10-CM | POA: Diagnosis not present

## 2017-01-14 DIAGNOSIS — R296 Repeated falls: Secondary | ICD-10-CM | POA: Diagnosis not present

## 2017-01-14 DIAGNOSIS — R2681 Unsteadiness on feet: Secondary | ICD-10-CM | POA: Diagnosis not present

## 2017-01-14 DIAGNOSIS — Z6824 Body mass index (BMI) 24.0-24.9, adult: Secondary | ICD-10-CM | POA: Diagnosis not present

## 2017-01-14 DIAGNOSIS — M545 Low back pain: Secondary | ICD-10-CM | POA: Diagnosis not present

## 2017-01-18 DIAGNOSIS — H353221 Exudative age-related macular degeneration, left eye, with active choroidal neovascularization: Secondary | ICD-10-CM | POA: Diagnosis not present

## 2017-01-18 DIAGNOSIS — H353211 Exudative age-related macular degeneration, right eye, with active choroidal neovascularization: Secondary | ICD-10-CM | POA: Diagnosis not present

## 2017-01-18 DIAGNOSIS — H35352 Cystoid macular degeneration, left eye: Secondary | ICD-10-CM | POA: Diagnosis not present

## 2017-01-18 DIAGNOSIS — H353133 Nonexudative age-related macular degeneration, bilateral, advanced atrophic without subfoveal involvement: Secondary | ICD-10-CM | POA: Diagnosis not present

## 2017-02-01 ENCOUNTER — Encounter: Payer: Self-pay | Admitting: Physical Therapy

## 2017-02-01 ENCOUNTER — Ambulatory Visit: Payer: Medicare Other | Attending: Internal Medicine | Admitting: Physical Therapy

## 2017-02-01 DIAGNOSIS — R2681 Unsteadiness on feet: Secondary | ICD-10-CM | POA: Diagnosis not present

## 2017-02-01 DIAGNOSIS — R2689 Other abnormalities of gait and mobility: Secondary | ICD-10-CM | POA: Insufficient documentation

## 2017-02-02 NOTE — Therapy (Signed)
[facility] [address] [place], [place], [address] Phone: [phone]   Fax:  [phone]  Physical Therapy Evaluation  Patient Details  Name: Dawn Hampton MRN: [id number] Date of Birth: 1922/02/08 Referring Provider: Dr. Leanna Battles  Encounter Date: 02/01/2017      PT End of Session - 02/02/17 1439    Visit Number 1   Number of Visits 9   Date for PT Re-Evaluation 03/05/17   Authorization Type Medicare   Authorization Time Period 02-01-17 - 04-02-17   PT Start Time 1020   PT Stop Time 1102   PT Time Calculation (min) 42 min      Past Medical History:  Diagnosis Date  . Abdominal mass, right lower quadrant   . Anemia   . Hypertension   . Macular degeneration   . Osteoporosis   . Shingles    T8    Past Surgical History:  Procedure Laterality Date  . cataracts  2004  . PUD, partial gastrectomy 1992    . TOTAL ABDOMINAL HYSTERECTOMY W/ BILATERAL SALPINGOOPHORECTOMY  1975    There were no vitals filed for this visit.       Subjective Assessment - 02/02/17 1430    Subjective Pt reports she fell in mid May while planting flowers - says her flower pot fell one way and she went the other way.  Sustained head and elbow abrasion.  Pt also reports she sustained a fall about 2 weeks prior to this fall in the bathroom but did not go to MD after this fall.    Pertinent History Osteoporosis, HTN, macular degeneration, h/o shingles, anemia   Patient Stated Goals improve balance   Currently in Pain? No/denies            [facility] PT Assessment - 02/02/17 0001      Assessment   Medical Diagnosis Gait Instability   Referring Provider Dr. Leanna Battles   Onset Date/Surgical Date 01/08/17   Prior Therapy April - May 2016 at this facility     Precautions   Precautions Fall;Other (comment)  Macular Degeneration     Restrictions   Weight Bearing Restrictions No     Balance Screen   Has the patient  fallen in the past 6 months Yes   How many times? 2   Has the patient had a decrease in activity level because of a fear of falling?  No   Is the patient reluctant to leave their home because of a fear of falling?  No     Home Environment   Living Environment Private residence   Type of [place] Access Stairs to enter   Entrance Stairs-Number of Steps 1   Entrance Stairs-Rails None   Home Layout One level     Prior Function   Level of Independence Independent     ROM / Strength   AROM / PROM / Strength Strength     Strength   Overall Strength Within functional limits for tasks performed     Transfers   Transfers Sit to Stand   Sit to Stand 6: Modified independent (Device/Increase time)   Comments UE support needed     Ambulation/Gait   Ambulation/Gait Yes   Ambulation/Gait Assistance 5: Supervision   Ambulation Distance (Feet) 100 Feet   Assistive device Straight cane   Gait Pattern Step-through pattern   Ambulation Surface Level;Indoor   Gait velocity 1.51 ft/sec = 21.74 secs     Standardized  Balance Assessment   Standardized Balance Assessment Berg Balance Test;Timed Up and Go Test     Berg Balance Test   Sit to Stand Able to stand  independently using hands   Standing Unsupported Able to stand safely 2 minutes   Sitting with Back Unsupported but Feet Supported on Floor or Stool Able to sit safely and securely 2 minutes   Stand to Sit Sits safely with minimal use of hands   Transfers Able to transfer safely, definite need of hands   Standing Unsupported with Eyes Closed Able to stand 10 seconds with supervision   Standing Ubsupported with Feet Together Able to place feet together independently and stand for 1 minute with supervision   From Standing, Reach Forward with Outstretched Arm Can reach forward >12 cm safely (5")   From Standing Position, Pick up Object from Floor Able to pick up shoe, needs supervision   From Standing Position, Turn to Look Behind  Over each Shoulder Turn sideways only but maintains balance   Turn 360 Degrees Able to turn 360 degrees safely but slowly  R= 6.78  L= 6.47   Standing Unsupported, Alternately Place Feet on Step/Stool Able to complete >2 steps/needs minimal assist   Standing Unsupported, One Foot in Front Able to take small step independently and hold 30 seconds   Standing on One Leg Tries to lift leg/unable to hold 3 seconds but remains standing independently   Total Score 38     Timed Up and Go Test   Normal TUG (seconds) 21.6  with cane            Objective measurements completed on examination: See above findings.                  PT Education - 02/02/17 1437    Education provided Yes   Education Details Reviewed TUG and Berg test scores with explanation of interpretation of results to pt - emphasizing her high risk of fall with use of SPC and strong recommendation for use of RW to maximize safety with amb. and to minimize fall risk - pt declines use of RW at this time   Person(s) Educated Patient   Methods Explanation   Comprehension Verbalized understanding;Need further instruction             PT Long Term Goals - 02/02/17 1452      PT LONG TERM GOAL #1   Title Improve TUG score from 21.60 secs to </= 17 secs with SPC (per pt's request) to reduce fall risk.  03-05-17   Time 4   Period Weeks   Status New     PT LONG TERM GOAL #2   Title The patient will improve Berg score from 38/56 up to 43/56 to demo decreasing risk for falls.  03-05-17   Baseline score 38/56   Time 8   Period Weeks   Status New     PT LONG TERM GOAL #3   Title Independent in HEP for balance and strengthening exercises.  03-05-17   Time 4   Period Weeks   Status New     PT LONG TERM GOAL #4   Title Pt will negotiate 2 steps with hand rail with SPC to simulate step negotiation at her back entrance into home.  03-05-17   Time 4   Period Weeks   Status New                Plan -  02/02/17 1440  Clinical Impression Statement Pt is a 83 yr old lady with gait instability who has had 2 falls within past 6-7 weeks.  Pt is currently using a SPC (upon MD's recommendation) since sustaining 2nd fall on 01-08-17.  Pt states she did not use assistive device prior to this fall.   History and Personal Factors relevant to plan of care: macular degeneration, pt lives alone but has call button in case of emergency: pt is very independent and has declined recommendation for use of RW at this time   Clinical Presentation Evolving   Clinical Presentation due to: gait instability, balance deficits, macular degeneration, aging process   Clinical Decision Making Moderate   Rehab Potential Fair   Clinical Impairments Affecting Rehab Potential macular degeneration, balance deficits   PT Frequency 2x / week   PT Duration 4 weeks   PT Treatment/Interventions ADLs/Self Care Home Management;Stair training;Gait training;DME Instruction;Therapeutic activities;Therapeutic exercise;Balance training;Neuromuscular re-education;Patient/family education   PT Next Visit Plan HEP - balance and functional strengthening including sit to stand   PT Home Exercise Plan see above   Consulted and Agree with Plan of Care Patient      Patient will benefit from skilled therapeutic intervention in order to improve the following deficits and impairments:  Abnormal gait, Decreased activity tolerance, Decreased balance, Decreased safety awareness, Decreased strength  Visit Diagnosis: Other abnormalities of gait and mobility - Plan: PT plan of care cert/re-cert  Unsteadiness on feet - Plan: PT plan of care cert/re-cert      G-Codes - 77/82/42 1459    Functional Assessment Tool Used (Outpatient Only) Berg score 38/56:  TUG score 21.60 secs;  Gait velocity 1.51 ft/sec   Functional Limitation Mobility: Walking and moving around   Mobility: Walking and Moving Around Current Status (P5361) At least 40 percent but less  than 60 percent impaired, limited or restricted   Mobility: Walking and Moving Around Goal Status (W4315) At least 20 percent but less than 40 percent impaired, limited or restricted       Problem List There are no active problems to display for this patient.   Alda Lea, PT 02/02/2017, 3:04 PM  [facility] [address] [place], [place], [address] Phone: [phone]   Fax:  [phone]  Name: Dawn Hampton MRN: [id number] Date of Birth: 1922/04/03

## 2017-02-04 ENCOUNTER — Ambulatory Visit: Payer: Medicare Other | Admitting: Physical Therapy

## 2017-02-04 DIAGNOSIS — R2689 Other abnormalities of gait and mobility: Secondary | ICD-10-CM

## 2017-02-04 DIAGNOSIS — R2681 Unsteadiness on feet: Secondary | ICD-10-CM | POA: Diagnosis not present

## 2017-02-04 NOTE — Therapy (Signed)
[facility] [address] [place], [place], [address] Phone: [phone]   Fax:  [phone]  Physical Therapy Treatment  Patient Details  Name: Dawn Hampton MRN: [id number] Date of Birth: Dec 08, 1921 Referring Provider: Dr. Leanna Battles  Encounter Date: 02/04/2017      PT End of Session - 02/04/17 1407    Visit Number 2   Number of Visits 9   Date for PT Re-Evaluation 03/05/17   Authorization Type Medicare   Authorization Time Period 02-01-17 - 04-02-17   PT Start Time 1318   PT Stop Time 1358   PT Time Calculation (min) 40 min   Activity Tolerance Patient tolerated treatment well  Reports fatigue with retro walking activity, about 2/3 way into the session   Behavior During Therapy [facility] for tasks assessed/performed      Past Medical History:  Diagnosis Date  . Abdominal mass, right lower quadrant   . Anemia   . Hypertension   . Macular degeneration   . Osteoporosis   . Shingles    T8    Past Surgical History:  Procedure Laterality Date  . cataracts  2004  . PUD, partial gastrectomy 1992    . TOTAL ABDOMINAL HYSTERECTOMY W/ BILATERAL SALPINGOOPHORECTOMY  1975    There were no vitals filed for this visit.      Subjective Assessment - 02/04/17 1320    Subjective No changes since eval, no pain   Pertinent History Osteoporosis, HTN, macular degeneration, h/o shingles, anemia   Patient Stated Goals improve balance   Currently in Pain? No/denies                         [facility] Adult PT Treatment/Exercise - 02/04/17 0001      Self-Care   Self-Care Other Self-Care Comments   Other Self-Care Comments  Fall prevention education in home environment and in community-discussed and provided handout             Balance Exercises - 02/04/17 1402      Balance Exercises: Standing   Tandem Stance Eyes open;Upper extremity support 1;2 reps;10 secs  cues for visual target   SLS Eyes  open;Solid surface;Upper extremity support 1;Upper extremity support 2;2 reps;10 secs  cues for visual target use   SLS with Vectors Solid surface;Upper extremity assist 2  alternating step taps x 12 reps at 6" step   Retro Gait Upper extremity support;3 reps  foward/back walking along counter, 2x R foot scuffs   Sidestepping Upper extremity support;2 reps  length of counter with supervision   Marching Limitations Marching in place x 10 reps 1 UE support   Heel Raises Limitations Heel raises x 10 reps   Toe Raise Limitations Toe raises x 10 reps   Sit to Stand Time 5 reps from 20" mat surface, then 5 reps from 18" chair with UE support.  PT provides cues for positioning, increased forward lean, upright posture in standing.  On initial rep, pt tries to not use UE support and ends up pushing backs of legs onto mat surface.     Other Standing Exercises Alternating hip kicks side x 10, then back x 10 reps (cues for straight leg).           PT Education - 02/04/17 1354    Education provided Yes   Education Details Balance HEP initiated; fall prevention education provided   [employer]) Educated Patient   Methods Explanation;Demonstration;Handout   Comprehension  Verbalized understanding;Returned demonstration             PT Long Term Goals - 02/02/17 1452      PT LONG TERM GOAL #1   Title Improve TUG score from 21.60 secs to </= 17 secs with SPC (per pt's request) to reduce fall risk.  03-05-17   Time 4   Period Weeks   Status New     PT LONG TERM GOAL #2   Title The patient will improve Berg score from 38/56 up to 43/56 to demo decreasing risk for falls.  03-05-17   Baseline score 38/56   Time 8   Period Weeks   Status New     PT LONG TERM GOAL #3   Title Independent in HEP for balance and strengthening exercises.  03-05-17   Time 4   Period Weeks   Status New     PT LONG TERM GOAL #4   Title Pt will negotiate 2 steps with hand rail with SPC to simulate step negotiation at  her back entrance into home.  03-05-17   Time 4   Period Weeks   Status New               Plan - 02/04/17 1408    Clinical Impression Statement Skilled PT session today focused on initiating balance activities and provided fall prevention education.  Pt able to perform balance activities with 1-2 UE support.  Pt does fatigue on forward/back walking activity, with 2 LOB, requiring therapist assistance to steady.  Sit<>stand not added to HEP yet, due to pt needing cueing on correct form.  Pt will continue to benefit from skilled PT to address balance and gait.   Rehab Potential Fair   Clinical Impairments Affecting Rehab Potential macular degeneration, balance deficits   PT Frequency 2x / week   PT Duration 4 weeks   PT Treatment/Interventions ADLs/Self Care Home Management;Stair training;Gait training;DME Instruction;Therapeutic activities;Therapeutic exercise;Balance training;Neuromuscular re-education;Patient/family education   PT Next Visit Plan Review HEP provided this visit; add sit<>stand to HEP; continue to work on balance and dynamic gait   PT Home Exercise Plan see above   Consulted and Agree with Plan of Care Patient      Patient will benefit from skilled therapeutic intervention in order to improve the following deficits and impairments:  Abnormal gait, Decreased activity tolerance, Decreased balance, Decreased safety awareness, Decreased strength  Visit Diagnosis: Other abnormalities of gait and mobility  Unsteadiness on feet     Problem List There are no active problems to display for this patient.   Frazier Butt 02/04/2017, 2:14 PM Frazier Butt., PT [facility] [address] [place], [place], [address] Phone: [phone]   Fax:  [phone]  Name: Dawn Hampton MRN: [id number] Date of Birth: [birth year]

## 2017-02-04 NOTE — Patient Instructions (Addendum)
Standing Marching   Using a chair if necessary, march in place. Repeat 10 times. Do 1-2 sessions per day.  http://gt2.exer.us/344   Copyright  VHI. All rights reserved.   Hip Backward Kick   Using a chair for balance, keep legs shoulder width apart and toes pointed for- ward. Slowly extend one leg back, keeping knee straight. Do not lean forward. Repeat with other leg. Repeat 10 times. Do 1-2 sessions per day.  http://gt2.exer.us/340   Copyright  VHI. All rights reserved.     Hip Side Kick   Holding a chair for balance, keep legs shoulder width apart and toes pointed forward. Swing a leg out to side, keeping knee straight. Do not lean. Repeat using other leg. Repeat 10 times. Do 1-2 sessions per day.  http://gt2.exer.us/342   Copyright  VHI. All rights reserved.   Feet Heel-Toe "Tandem", Varied Arm Positions - Eyes Open   With eyes open, right foot directly in front of the other, arms at the counter, look straight ahead at a stationary object. Hold 10 seconds. Repeat 3  times per session. Do 1-2 sessions per day.  Copyright  VHI. All rights reserved.       Standing On One Leg Without Support .  Stand on one leg in neutral spine without support. Hold onto counter for support.  Hold 10 seconds. Repeat on other leg. Do 3 repetitions, 1-2 sets.  http://bt.exer.us/36   Copyright  VHI. All rights reserved.     Toe / Heel Raise    Gently rock back on heels and raise toes. Then rock forward on toes and raise heels. Repeat sequence __10__ times per session. Do _1-2___ sessions per day.  Copyright  VHI. All rights reserved.

## 2017-02-10 ENCOUNTER — Ambulatory Visit: Payer: Medicare Other | Admitting: Physical Therapy

## 2017-02-10 DIAGNOSIS — R2689 Other abnormalities of gait and mobility: Secondary | ICD-10-CM | POA: Diagnosis not present

## 2017-02-10 DIAGNOSIS — R2681 Unsteadiness on feet: Secondary | ICD-10-CM | POA: Diagnosis not present

## 2017-02-10 NOTE — Therapy (Signed)
[facility] [address] [place], [place], [address] Phone: [phone]   Fax:  [phone]  Physical Therapy Treatment  Patient Details  Name: Dawn Hampton MRN: [id number] Date of Birth: 09-May-1922 Referring Provider: Dr. Leanna Battles  Encounter Date: 02/10/2017      PT End of Session - 02/10/17 1658    Visit Number 3   Number of Visits 9   Date for PT Re-Evaluation 03/05/17   Authorization Type Medicare   Authorization Time Period 02-01-17 - 04-02-17   PT Start Time 0934   PT Stop Time 1015   PT Time Calculation (min) 41 min   Activity Tolerance Patient tolerated treatment well  Reports fatigue with retro walking activity, about 2/3 way into the session   Behavior During Therapy [facility] for tasks assessed/performed      Past Medical History:  Diagnosis Date  . Abdominal mass, right lower quadrant   . Anemia   . Hypertension   . Macular degeneration   . Osteoporosis   . Shingles    T8    Past Surgical History:  Procedure Laterality Date  . cataracts  2004  . PUD, partial gastrectomy 1992    . TOTAL ABDOMINAL HYSTERECTOMY W/ BILATERAL SALPINGOOPHORECTOMY  1975    There were no vitals filed for this visit.      Subjective Assessment - 02/10/17 0936    Subjective Did my exercises over the weekend, no falls   Pertinent History Osteoporosis, HTN, macular degeneration, h/o shingles, anemia   Patient Stated Goals improve balance   Currently in Pain? No/denies                         [facility] Adult PT Treatment/Exercise - 02/10/17 1009      Ambulation/Gait   Ambulation/Gait Yes   Ambulation/Gait Assistance 5: Supervision;4: Min guard   Ambulation/Gait Assistance Details PT provides cues for pt to sequence cane in R hand with L step   Ambulation Distance (Feet) 115 Feet  100 ft x 2   Assistive device Straight cane   Gait Pattern Step-through pattern;Decreased step length -  right;Decreased step length - left;Narrow base of support   Ambulation Surface Level;Indoor   Pre-Gait Activities Practiced turns, including figure-8 turns around cones x 2 reps, then full 180 degree turns around cones, then tight, narrow turn, with cues to lift feet, increase foot clearance, as pt prefers to pivot and turn.             Balance Exercises - 02/10/17 0948      Balance Exercises: Standing   Sidestepping Upper extremity support;2 reps  Along mat table to replicate table at home   Sit to Stand Time 5 reps from 22" mat surface, 5 reps from 18" chair, with cues for positioning, increased forward lean for sit<>stand     Reviewed HEP from last visit:  Marching in place x 10, back kicks x 10, side kicks x 10, tandem stance 2 reps x 10 seconds (needs cues to stay steady, as pt is trying to lift toes with this exercise), SLS 2 reps x 10 seconds, heel/toe raises x 10 reps at counter.  Pt needs min cues for technique.  Pt needs several brief rest breaks during PT session, due to reported fatigue.      PT Education - 02/10/17 1657    Education provided Yes   Education Details additions to HEP; proper sequence with cane   Person(s)  Educated Patient   Methods Explanation;Demonstration;Handout   Comprehension Verbalized understanding;Returned demonstration             PT Long Term Goals - 02/02/17 1452      PT LONG TERM GOAL #1   Title Improve TUG score from 21.60 secs to </= 17 secs with SPC (per pt's request) to reduce fall risk.  03-05-17   Time 4   Period Weeks   Status New     PT LONG TERM GOAL #2   Title The patient will improve Berg score from 38/56 up to 43/56 to demo decreasing risk for falls.  03-05-17   Baseline score 38/56   Time 8   Period Weeks   Status New     PT LONG TERM GOAL #3   Title Independent in HEP for balance and strengthening exercises.  03-05-17   Time 4   Period Weeks   Status New     PT LONG TERM GOAL #4   Title Pt will negotiate 2  steps with hand rail with SPC to simulate step negotiation at her back entrance into home.  03-05-17   Time 4   Period Weeks   Status New               Plan - 02/10/17 1658    Clinical Impression Statement Reviewed pt's HEP from last visit, with pt needing minimal cues for correct technique.  Added sit<>stand and side stepping to HEP.  Briefly discussed potential RW use vs. cane, but pt feels she does not need RW at this time.  Addressed safety with gait using cane, with instruction on proper sequencing of cane with gait and worked on turns.  Pt will continue to benefit from further skilled PT to address balance and gait.   Rehab Potential Fair   Clinical Impairments Affecting Rehab Potential macular degeneration, balance deficits   PT Frequency 2x / week   PT Duration 4 weeks   PT Treatment/Interventions ADLs/Self Care Home Management;Stair training;Gait training;DME Instruction;Therapeutic activities;Therapeutic exercise;Balance training;Neuromuscular re-education;Patient/family education   PT Next Visit Plan Review sit<>stand and sidestepping from HEP; continue to work on dynamic balance and gait, turns   PT Home Exercise Plan see above   Consulted and Agree with Plan of Care Patient      Patient will benefit from skilled therapeutic intervention in order to improve the following deficits and impairments:  Abnormal gait, Decreased activity tolerance, Decreased balance, Decreased safety awareness, Decreased strength  Visit Diagnosis: Other abnormalities of gait and mobility  Unsteadiness on feet     Problem List There are no active problems to display for this patient.   Gemini Bunte W. 02/10/2017, 5:01 PM  Frazier Butt., PT   [facility] [address] [place], [place], [address] Phone: [phone]   Fax:  [phone]  Name: Dawn Hampton MRN: [id number] Date of Birth: February 12, 1922

## 2017-02-10 NOTE — Patient Instructions (Addendum)
HIP / KNEE: Extension - Sit to Stand    Sitting, lean chest forward, raise hips up from surface. Straighten hips and knees. Weight bear equally on left and right sides. Backs of legs should not push off surface. __5-10_ reps per set, __1-2_ sets per day.  You can do this from either your bed or a chair with arms.  If you use the arms of the chair, make sure to do a quick boost with your hands from the chair, instead of pushing down through your arms.   Copyright  VHI. All rights reserved.  Side-Stepping    At your table, walk to left side with eyes open. Take even steps, leading with same foot. Make sure each foot lifts off the floor. Repeat in opposite direction. Repeat for _2-3___ times the length of your table.  Copyright  VHI. All rights reserved.

## 2017-02-11 ENCOUNTER — Ambulatory Visit: Payer: Medicare Other | Admitting: Physical Therapy

## 2017-02-11 DIAGNOSIS — R2681 Unsteadiness on feet: Secondary | ICD-10-CM

## 2017-02-11 DIAGNOSIS — R2689 Other abnormalities of gait and mobility: Secondary | ICD-10-CM

## 2017-02-12 NOTE — Therapy (Signed)
[facility] [address] [place], [place], [address] Phone: [phone]   Fax:  [phone]  Physical Therapy Treatment  Patient Details  Name: Dawn Hampton MRN: [id number] Date of Birth: [birth year] Referring Provider: Dr. Leanna Battles  Encounter Date: 02/11/2017      PT End of Session - 02/12/17 2020    Visit Number 4   Number of Visits 9   Date for PT Re-Evaluation 03/05/17   Authorization Type Medicare   Authorization Time Period 02-01-17 - 04-02-17   PT Start Time 1534   PT Stop Time 1614   PT Time Calculation (min) 40 min      Past Medical History:  Diagnosis Date  . Abdominal mass, right lower quadrant   . Anemia   . Hypertension   . Macular degeneration   . Osteoporosis   . Shingles    T8    Past Surgical History:  Procedure Laterality Date  . cataracts  2004  . PUD, partial gastrectomy 1992    . TOTAL ABDOMINAL HYSTERECTOMY W/ BILATERAL SALPINGOOPHORECTOMY  1975    There were no vitals filed for this visit.      Subjective Assessment - 02/12/17 2016    Subjective Pt reports doing exercises at home   Pertinent History Osteoporosis, HTN, macular degeneration, h/o shingles, anemia   Patient Stated Goals improve balance   Currently in Pain? No/denies                         [facility] Adult PT Treatment/Exercise - 02/12/17 0001      Transfers   Transfers Sit to Stand   Sit to Stand 4: Min guard   Number of Reps Other reps (comment)  5   Comments UE support - pt stood up on blue [employer]irex     TherEx:  Pt performed heel raises x 10 reps Standing bil. Hip extension and abduction with 2# weight on each leg - 10 reps each        Balance Exercises - 02/12/17 2016      Balance Exercises: Standing   Standing Eyes Opened Wide (BOA);Foam/compliant surface;2 reps;10 secs   Stepping Strategy Anterior;Posterior;Lateral;5 reps   Sidestepping 2 reps;Upper extremity support    Other Standing Exercises Pt performed Fig.8's around cones with cane with CGA to improve balacne with turning     Pt performed ladder activity with no device with +1 min hand held assist to incr. Step length  Cone taps with mod hand held assist for improved single limb stance Pt stood on incline with min assist to maintain balance ( no device) for incr. Vestibular input; marching on incline with mod to min assist For balance recovery           PT Long Term Goals - 02/02/17 1452      PT LONG TERM GOAL #1   Title Improve TUG score from 21.60 secs to </= 17 secs with SPC (per pt's request) to reduce fall risk.  03-05-17   Time 4   Period Weeks   Status New     PT LONG TERM GOAL #2   Title The patient will improve Berg score from 38/56 up to 43/56 to demo decreasing risk for falls.  03-05-17   Baseline score 38/56   Time 8   Period Weeks   Status New     PT LONG TERM GOAL #3   Title Independent in HEP for balance and strengthening  exercises.  03-05-17   Time 4   Period Weeks   Status New     PT LONG TERM GOAL #4   Title Pt will negotiate 2 steps with hand rail with SPC to simulate step negotiation at her back entrance into home.  03-05-17   Time 4   Period Weeks   Status New               Plan - 02/12/17 2021    Clinical Impression Statement Pt continues to decline recommendation for use of RW for assistance with amb. at this time; pt has age-related balance deficits - exhibits decreased balance with turning; anticipate early D/C from PT as pt does not see benefit or purpose in receiving this service   Rehab Potential Fair   Clinical Impairments Affecting Rehab Potential macular degeneration, balance deficits   PT Frequency 2x / week   PT Duration 4 weeks   PT Treatment/Interventions ADLs/Self Care Home Management;Stair training;Gait training;DME Instruction;Therapeutic activities;Therapeutic exercise;Balance training;Neuromuscular re-education;Patient/family education    PT Next Visit Plan cont balance and gait - D/C in next 1-2 weeks   PT Home Exercise Plan see above   Consulted and Agree with Plan of Care Patient      Patient will benefit from skilled therapeutic intervention in order to improve the following deficits and impairments:  Abnormal gait, Decreased activity tolerance, Decreased balance, Decreased safety awareness, Decreased strength  Visit Diagnosis: Other abnormalities of gait and mobility  Unsteadiness on feet     Problem List There are no active problems to display for this patient.   Alda Lea, PT 02/12/2017, 8:31 PM  [facility] [address] [place], [place], [address] Phone: [phone]   Fax:  [phone]  Name: Anesa Fronek MRN: [id number] Date of Birth: [birth year]

## 2017-02-15 ENCOUNTER — Ambulatory Visit: Payer: Medicare Other | Admitting: Physical Therapy

## 2017-02-15 DIAGNOSIS — R2681 Unsteadiness on feet: Secondary | ICD-10-CM | POA: Diagnosis not present

## 2017-02-15 DIAGNOSIS — R2689 Other abnormalities of gait and mobility: Secondary | ICD-10-CM | POA: Diagnosis not present

## 2017-02-15 NOTE — Therapy (Signed)
[facility] [address] [place], [place], [address] Phone: [phone]   Fax:  [phone]  Physical Therapy Treatment  Patient Details  Name: Dawn Hampton MRN: [id number] Date of Birth: [birth year] Referring Provider: Dr. Leanna Battles  Encounter Date: 02/15/2017      PT End of Session - 02/15/17 0907    Visit Number 5   Number of Visits 9   Date for PT Re-Evaluation 03/05/17   Authorization Type Medicare   Authorization Time Period 02-01-17 - 04-02-17   PT Start Time 0805   PT Stop Time 0843   PT Time Calculation (min) 38 min   Equipment Utilized During Treatment Gait belt   Activity Tolerance Patient tolerated treatment well   Behavior During Therapy [facility] for tasks assessed/performed      Past Medical History:  Diagnosis Date  . Abdominal mass, right lower quadrant   . Anemia   . Hypertension   . Macular degeneration   . Osteoporosis   . Shingles    T8    Past Surgical History:  Procedure Laterality Date  . cataracts  2004  . PUD, partial gastrectomy 1992    . TOTAL ABDOMINAL HYSTERECTOMY W/ BILATERAL SALPINGOOPHORECTOMY  1975    There were no vitals filed for this visit.      Subjective Assessment - 02/15/17 0806    Subjective No changes, no pain today.   Pertinent History Osteoporosis, HTN, macular degeneration, h/o shingles, anemia   Patient Stated Goals improve balance   Currently in Pain? No/denies                         [facility] Adult PT Treatment/Exercise - 02/15/17 0001      Transfers   Transfers Sit to Stand;Stand to Sit   Sit to Stand 5: Supervision;With upper extremity assist;From chair/3-in-1   Stand to Sit 5: Supervision;With upper extremity assist;To chair/3-in-1   Number of Reps Other reps (comment)  At least 8 reps throughout session      Ambulation/Gait   Ambulation/Gait Yes   Ambulation/Gait Assistance 5: Supervision   Ambulation Distance (Feet)  60 Feet  x 2; then 115 ft, 80 ft   Assistive device Straight cane   Gait Pattern Step-through pattern;Decreased step length - right;Decreased step length - left;Narrow base of support   Ambulation Surface Level;Indoor   Pre-Gait Activities Worked on figure-8 turns around cones with and without cane, with supervision, then walking 15 ft with narrow space turns, x 3 reps with and without cane.  Pt demo improved foot clearance, improved turning ability this visit.   Gait Comments Discussed safety with steps, carrying objects, and turns within home.     Standardized Balance Assessment   Standardized Balance Assessment Timed Up and Go Test     Timed Up and Go Test   TUG Normal TUG   Normal TUG (seconds) 27.28             Balance Exercises - 02/15/17 0808      Balance Exercises: Standing   Standing Eyes Opened Wide (BOA);Narrow base of support (BOS);Foam/compliant surface;Head turns  Head nods, 10 reps each   Standing Eyes Closed Wide (BOA);Narrow base of support (BOS);Foam/compliant surface;1 rep;10 secs   SLS with Vectors Solid surface;Upper extremity assist 2  Step taps x 10 reps to 6" step   Marching Limitations Marching in place on pillows x 8 reps with UE support   Other Standing Exercises On  pillows in corner-forward kicks x 8 reps, forward step taps to floor x 10 reps with UE support.  On ramp:  EO and EC on incline and decline head turns and head nods x 5 reps each.     Pt requires several seated rest breaks through session.  Used time in sitting to discuss safety with gait in and out of home, problem-solved through gait, turning, carrying object activities.           PT Long Term Goals - 02/02/17 1452      PT LONG TERM GOAL #1   Title Improve TUG score from 21.60 secs to </= 17 secs with SPC (per pt's request) to reduce fall risk.  03-05-17   Time 4   Period Weeks   Status New     PT LONG TERM GOAL #2   Title The patient will improve Berg score from 38/56 up to  43/56 to demo decreasing risk for falls.  03-05-17   Baseline score 38/56   Time 8   Period Weeks   Status New     PT LONG TERM GOAL #3   Title Independent in HEP for balance and strengthening exercises.  03-05-17   Time 4   Period Weeks   Status New     PT LONG TERM GOAL #4   Title Pt will negotiate 2 steps with hand rail with SPC to simulate step negotiation at her back entrance into home.  03-05-17   Time 4   Period Weeks   Status New               Plan - 02/15/17 0908    Clinical Impression Statement Pt continues to report comfort in home without assistive device and at times not using cane with short distance walks into yard to tend to garden plants.  Pt does appear to be steadier with turning practice today.  Pt reports doing exercises at home when not too busy or not too fatigued.  Will likely assess goals next session, with possible D/C, as pt continues to ambulate without cane and will not use RW, despite PT recomendations.   Rehab Potential Fair   Clinical Impairments Affecting Rehab Potential macular degeneration, balance deficits   PT Frequency 2x / week   PT Duration 4 weeks   PT Treatment/Interventions ADLs/Self Care Home Management;Stair training;Gait training;DME Instruction;Therapeutic activities;Therapeutic exercise;Balance training;Neuromuscular re-education;Patient/family education   PT Next Visit Plan Check LTS and likely discharge next visit.   PT Home Exercise Plan see above   Consulted and Agree with Plan of Care Patient      Patient will benefit from skilled therapeutic intervention in order to improve the following deficits and impairments:  Abnormal gait, Decreased activity tolerance, Decreased balance, Decreased safety awareness, Decreased strength  Visit Diagnosis: Unsteadiness on feet  Other abnormalities of gait and mobility     Problem List There are no active problems to display for this patient.   Jorene Kaylor W. 02/15/2017, 9:11 AM   Frazier Butt., PT  [facility] [address] [place], [place], [address] Phone: [phone]   Fax:  [phone]  Name: Dawn Hampton MRN: [id number] Date of Birth: September 12, 1921

## 2017-02-19 ENCOUNTER — Ambulatory Visit: Payer: Medicare Other | Admitting: Physical Therapy

## 2017-02-19 DIAGNOSIS — R2689 Other abnormalities of gait and mobility: Secondary | ICD-10-CM | POA: Diagnosis not present

## 2017-02-19 DIAGNOSIS — R2681 Unsteadiness on feet: Secondary | ICD-10-CM

## 2017-02-19 NOTE — Therapy (Signed)
[facility] [address] [place], [place], [address] Phone: [phone]   Fax:  [phone]  Physical Therapy Treatment  Patient Details  Name: Dawn Hampton MRN: [id number] Date of Birth: [birth year] Referring Provider: Dr. Leanna Battles  Encounter Date: 02/19/2017      PT End of Session - 02/19/17 0842    Visit Number 6   Number of Visits 9   Date for PT Re-Evaluation 03/05/17   Authorization Type Medicare   Authorization Time Period 02-01-17 - 04-02-17   PT Start Time 0801   PT Stop Time 0834  Discharge this visit   PT Time Calculation (min) 33 min   Activity Tolerance Patient tolerated treatment well   Behavior During Therapy [facility] for tasks assessed/performed      Past Medical History:  Diagnosis Date  . Abdominal mass, right lower quadrant   . Anemia   . Hypertension   . Macular degeneration   . Osteoporosis   . Shingles    T8    Past Surgical History:  Procedure Laterality Date  . cataracts  2004  . PUD, partial gastrectomy 1992    . TOTAL ABDOMINAL HYSTERECTOMY W/ BILATERAL SALPINGOOPHORECTOMY  1975    There were no vitals filed for this visit.      Subjective Assessment - 02/19/17 0804    Subjective No falls, no changes since last visit.   Pertinent History Osteoporosis, HTN, macular degeneration, h/o shingles, anemia   Patient Stated Goals improve balance   Currently in Pain? No/denies                         [facility] Adult PT Treatment/Exercise - 02/19/17 0808      Ambulation/Gait   Ambulation/Gait Yes   Ambulation/Gait Assistance 6: Modified independent (Device/Increase time)   Ambulation Distance (Feet) 100 Feet  then 60   Assistive device Straight cane   Gait Pattern Step-through pattern;Decreased step length - right;Decreased step length - left;Narrow base of support   Ambulation Surface Level;Indoor   Gait velocity 17.72 sec = 1.85 ft/sec   Stairs Yes    Stairs Assistance 6: Modified independent (Device/Increase time)   Stair Management Technique One rail Left;One rail Right;Alternating pattern;Step to pattern;With cane  Inconsistent use of cane with steps   Number of Stairs 4  x 2   Height of Stairs 6     Standardized Balance Assessment   Standardized Balance Assessment Timed Up and Go Test;Berg Balance Test     Berg Balance Test   Sit to Stand Able to stand without using hands and stabilize independently   Standing Unsupported Able to stand safely 2 minutes   Sitting with Back Unsupported but Feet Supported on Floor or Stool Able to sit safely and securely 2 minutes   Stand to Sit Sits safely with minimal use of hands   Transfers Able to transfer safely, minor use of hands   Standing Unsupported with Eyes Closed Able to stand 10 seconds safely   Standing Ubsupported with Feet Together Able to place feet together independently and stand for 1 minute with supervision   From Standing, Reach Forward with Outstretched Arm Can reach forward >12 cm safely (5")   From Standing Position, Pick up Object from Floor Able to pick up shoe safely and easily   From Standing Position, Turn to Look Behind Over each Shoulder Looks behind from both sides and weight shifts well   Turn 360 Degrees Able  to turn 360 degrees safely but slowly   Standing Unsupported, Alternately Place Feet on Step/Stool Able to complete >2 steps/needs minimal assist   Standing Unsupported, One Foot in Front Able to plae foot ahead of the other independently and hold 30 seconds   Standing on One Leg Unable to try or needs assist to prevent fall   Total Score 44     Timed Up and Go Test   TUG Normal TUG   Normal TUG (seconds) 23.25  with cane, 19.44 sec no AD     Self-Care   Self-Care Other Self-Care Comments   Other Self-Care Comments  Verbal review of HEP, with pt verbalizing understanding and performance daily.  Reviewed fall prevention education provided at a previous  session.  Discussed progress towards goals and plans for discharge.  Pt in agreement.                PT Education - 2017-03-08 0841    Education provided Yes   Education Details Progress towards goals, plans for d/c this visit; review of HEP and review of fall prevention   Person(s) Educated Patient   Methods Explanation   Comprehension Verbalized understanding             PT Long Term Goals - 2017-03-08 0805      PT LONG TERM GOAL #1   Title Improve TUG score from 21.60 secs to </= 17 secs with SPC (per pt's request) to reduce fall risk.  03-05-17   Baseline TUG 23.25 sec 2017-03-08   Time 4   Period Weeks   Status Not Met     PT LONG TERM GOAL #2   Title The patient will improve Berg score from 38/56 up to 43/56 to demo decreasing risk for falls.  03-05-17   Baseline 44/56 on 08-Mar-2017   Time 8   Period Weeks   Status Achieved     PT LONG TERM GOAL #3   Title Independent in HEP for balance and strengthening exercises.  03-05-17   Time 4   Period Weeks   Status Achieved     PT LONG TERM GOAL #4   Title Pt will negotiate 2 steps with hand rail with SPC to simulate step negotiation at her back entrance into home.  03-05-17   Time 4   Period Weeks   Status Achieved               Plan - 03-08-2017 8333    Clinical Impression Statement Pt has met LTG 2, 3, 4.  LTG 1 not met for TUG.  Pt has demo improved Berg and gait velocity measures, and reports doing HEP at home daily.  Pt is appropriate for discharge this visit.   Rehab Potential Fair   Clinical Impairments Affecting Rehab Potential macular degeneration, balance deficits   PT Frequency 2x / week   PT Duration 4 weeks   PT Treatment/Interventions ADLs/Self Care Home Management;Stair training;Gait training;DME Instruction;Therapeutic activities;Therapeutic exercise;Balance training;Neuromuscular re-education;Patient/family education   PT Next Visit Plan Discharge this visit.   PT Home Exercise Plan see above    Consulted and Agree with Plan of Care Patient      Patient will benefit from skilled therapeutic intervention in order to improve the following deficits and impairments:  Abnormal gait, Decreased activity tolerance, Decreased balance, Decreased safety awareness, Decreased strength  Visit Diagnosis: Other abnormalities of gait and mobility  Unsteadiness on feet       G-Codes - March 08, 2017 8329  Functional Assessment Tool Used (Outpatient Only) Berg score 44/56:  TUG score 23.25 secs;  Gait velocity 1.85 ft/sec   Functional Limitation Mobility: Walking and moving around   Mobility: Walking and Moving Around Goal Status [phone]) At least 20 percent but less than 40 percent impaired, limited or restricted   Mobility: Walking and Moving Around Discharge Status [phone]) At least 20 percent but less than 40 percent impaired, limited or restricted      Problem List There are no active problems to display for this patient.   Dimetrius Montfort W. 02/19/2017, 8:46 AM Frazier Butt., PT [facility] [address] [place], [place], [address] Phone: [phone]   Fax:  [phone]  Name: Dawn Hampton MRN: [id number] Date of Birth: 15-Dec-1921   PHYSICAL THERAPY DISCHARGE SUMMARY  Visits from Start of Care: 6  Current functional level related to goals / functional outcomes: See LTG check above-pt has met 3 of 4 goals   Remaining deficits: Balance, endurance   Education / Equipment: Educated in HEP, fall prevention, need for use of assistive device  Plan: Patient agrees to discharge.  Patient goals were met. Patient is being discharged due to being pleased with the current functional level.  ?????  Mady Haagensen, PT 02/19/17 8:47 AM Phone: [phone] Fax: [phone]

## 2017-02-22 DIAGNOSIS — H353221 Exudative age-related macular degeneration, left eye, with active choroidal neovascularization: Secondary | ICD-10-CM | POA: Diagnosis not present

## 2017-02-22 DIAGNOSIS — H353123 Nonexudative age-related macular degeneration, left eye, advanced atrophic without subfoveal involvement: Secondary | ICD-10-CM | POA: Diagnosis not present

## 2017-02-22 DIAGNOSIS — H353211 Exudative age-related macular degeneration, right eye, with active choroidal neovascularization: Secondary | ICD-10-CM | POA: Diagnosis not present

## 2017-02-22 DIAGNOSIS — H35351 Cystoid macular degeneration, right eye: Secondary | ICD-10-CM | POA: Diagnosis not present

## 2017-02-22 DIAGNOSIS — H43811 Vitreous degeneration, right eye: Secondary | ICD-10-CM | POA: Diagnosis not present

## 2017-02-22 DIAGNOSIS — H353114 Nonexudative age-related macular degeneration, right eye, advanced atrophic with subfoveal involvement: Secondary | ICD-10-CM | POA: Diagnosis not present

## 2017-02-24 ENCOUNTER — Ambulatory Visit: Payer: Medicare Other | Admitting: Physical Therapy

## 2017-02-26 ENCOUNTER — Ambulatory Visit: Payer: Medicare Other | Admitting: Physical Therapy

## 2017-03-01 ENCOUNTER — Ambulatory Visit: Payer: Medicare Other | Admitting: Physical Therapy

## 2017-03-05 ENCOUNTER — Ambulatory Visit: Payer: Medicare Other | Admitting: Physical Therapy

## 2017-03-15 DIAGNOSIS — H35362 Drusen (degenerative) of macula, left eye: Secondary | ICD-10-CM | POA: Diagnosis not present

## 2017-03-15 DIAGNOSIS — H353123 Nonexudative age-related macular degeneration, left eye, advanced atrophic without subfoveal involvement: Secondary | ICD-10-CM | POA: Diagnosis not present

## 2017-03-15 DIAGNOSIS — H353114 Nonexudative age-related macular degeneration, right eye, advanced atrophic with subfoveal involvement: Secondary | ICD-10-CM | POA: Diagnosis not present

## 2017-03-15 DIAGNOSIS — H43812 Vitreous degeneration, left eye: Secondary | ICD-10-CM | POA: Diagnosis not present

## 2017-03-15 DIAGNOSIS — H353221 Exudative age-related macular degeneration, left eye, with active choroidal neovascularization: Secondary | ICD-10-CM | POA: Diagnosis not present

## 2017-04-20 DIAGNOSIS — Z23 Encounter for immunization: Secondary | ICD-10-CM | POA: Diagnosis not present

## 2017-04-20 DIAGNOSIS — Z6824 Body mass index (BMI) 24.0-24.9, adult: Secondary | ICD-10-CM | POA: Diagnosis not present

## 2017-04-20 DIAGNOSIS — D6489 Other specified anemias: Secondary | ICD-10-CM | POA: Diagnosis not present

## 2017-04-20 DIAGNOSIS — I10 Essential (primary) hypertension: Secondary | ICD-10-CM | POA: Diagnosis not present

## 2017-04-20 DIAGNOSIS — R296 Repeated falls: Secondary | ICD-10-CM | POA: Diagnosis not present

## 2017-04-20 DIAGNOSIS — H3530 Unspecified macular degeneration: Secondary | ICD-10-CM | POA: Diagnosis not present

## 2017-04-20 DIAGNOSIS — M545 Low back pain: Secondary | ICD-10-CM | POA: Diagnosis not present

## 2017-04-20 DIAGNOSIS — M810 Age-related osteoporosis without current pathological fracture: Secondary | ICD-10-CM | POA: Diagnosis not present

## 2017-04-20 DIAGNOSIS — R2681 Unsteadiness on feet: Secondary | ICD-10-CM | POA: Diagnosis not present

## 2017-05-04 DIAGNOSIS — H35352 Cystoid macular degeneration, left eye: Secondary | ICD-10-CM | POA: Diagnosis not present

## 2017-05-04 DIAGNOSIS — H353123 Nonexudative age-related macular degeneration, left eye, advanced atrophic without subfoveal involvement: Secondary | ICD-10-CM | POA: Diagnosis not present

## 2017-05-04 DIAGNOSIS — H353114 Nonexudative age-related macular degeneration, right eye, advanced atrophic with subfoveal involvement: Secondary | ICD-10-CM | POA: Diagnosis not present

## 2017-05-04 DIAGNOSIS — H353221 Exudative age-related macular degeneration, left eye, with active choroidal neovascularization: Secondary | ICD-10-CM | POA: Diagnosis not present

## 2017-05-04 DIAGNOSIS — H353211 Exudative age-related macular degeneration, right eye, with active choroidal neovascularization: Secondary | ICD-10-CM | POA: Diagnosis not present

## 2017-05-17 DIAGNOSIS — H353211 Exudative age-related macular degeneration, right eye, with active choroidal neovascularization: Secondary | ICD-10-CM | POA: Diagnosis not present

## 2017-05-17 DIAGNOSIS — H353221 Exudative age-related macular degeneration, left eye, with active choroidal neovascularization: Secondary | ICD-10-CM | POA: Diagnosis not present

## 2017-05-17 DIAGNOSIS — H353114 Nonexudative age-related macular degeneration, right eye, advanced atrophic with subfoveal involvement: Secondary | ICD-10-CM | POA: Diagnosis not present

## 2017-05-17 DIAGNOSIS — H353123 Nonexudative age-related macular degeneration, left eye, advanced atrophic without subfoveal involvement: Secondary | ICD-10-CM | POA: Diagnosis not present

## 2017-06-04 DIAGNOSIS — Z23 Encounter for immunization: Secondary | ICD-10-CM | POA: Diagnosis not present

## 2017-06-10 ENCOUNTER — Other Ambulatory Visit (HOSPITAL_COMMUNITY): Payer: Self-pay | Admitting: *Deleted

## 2017-06-11 ENCOUNTER — Ambulatory Visit (HOSPITAL_COMMUNITY)
Admission: RE | Admit: 2017-06-11 | Discharge: 2017-06-11 | Disposition: A | Discharge status: Home / Self Care | Payer: Medicare Other | Source: Ambulatory Visit | Attending: Internal Medicine | Admitting: Internal Medicine

## 2017-06-11 DIAGNOSIS — M810 Age-related osteoporosis without current pathological fracture: Secondary | ICD-10-CM | POA: Diagnosis not present

## 2017-06-11 MED ORDER — DENOSUMAB 60 MG/ML ~~LOC~~ SOLN
60.0000 mg | Freq: Once | SUBCUTANEOUS | Status: AC
  Administered 2017-06-11: 60 mg via SUBCUTANEOUS
  Filled 2017-06-11: qty 1

## 2017-07-12 DIAGNOSIS — H353221 Exudative age-related macular degeneration, left eye, with active choroidal neovascularization: Secondary | ICD-10-CM | POA: Diagnosis not present

## 2017-07-12 DIAGNOSIS — H353123 Nonexudative age-related macular degeneration, left eye, advanced atrophic without subfoveal involvement: Secondary | ICD-10-CM | POA: Diagnosis not present

## 2017-07-12 DIAGNOSIS — H353211 Exudative age-related macular degeneration, right eye, with active choroidal neovascularization: Secondary | ICD-10-CM | POA: Diagnosis not present

## 2017-07-12 DIAGNOSIS — H353114 Nonexudative age-related macular degeneration, right eye, advanced atrophic with subfoveal involvement: Secondary | ICD-10-CM | POA: Diagnosis not present

## 2017-07-19 DIAGNOSIS — H353230 Exudative age-related macular degeneration, bilateral, stage unspecified: Secondary | ICD-10-CM | POA: Diagnosis not present

## 2017-07-19 DIAGNOSIS — D485 Neoplasm of uncertain behavior of skin: Secondary | ICD-10-CM | POA: Diagnosis not present

## 2017-07-19 DIAGNOSIS — H52203 Unspecified astigmatism, bilateral: Secondary | ICD-10-CM | POA: Diagnosis not present

## 2017-07-19 DIAGNOSIS — H26493 Other secondary cataract, bilateral: Secondary | ICD-10-CM | POA: Diagnosis not present

## 2017-08-02 DIAGNOSIS — H353114 Nonexudative age-related macular degeneration, right eye, advanced atrophic with subfoveal involvement: Secondary | ICD-10-CM | POA: Diagnosis not present

## 2017-08-02 DIAGNOSIS — H353221 Exudative age-related macular degeneration, left eye, with active choroidal neovascularization: Secondary | ICD-10-CM | POA: Diagnosis not present

## 2017-08-02 DIAGNOSIS — H353123 Nonexudative age-related macular degeneration, left eye, advanced atrophic without subfoveal involvement: Secondary | ICD-10-CM | POA: Diagnosis not present

## 2017-08-02 DIAGNOSIS — H353211 Exudative age-related macular degeneration, right eye, with active choroidal neovascularization: Secondary | ICD-10-CM | POA: Diagnosis not present

## 2017-09-13 DIAGNOSIS — H353211 Exudative age-related macular degeneration, right eye, with active choroidal neovascularization: Secondary | ICD-10-CM | POA: Diagnosis not present

## 2017-09-13 DIAGNOSIS — H353221 Exudative age-related macular degeneration, left eye, with active choroidal neovascularization: Secondary | ICD-10-CM | POA: Diagnosis not present

## 2017-09-13 DIAGNOSIS — H35352 Cystoid macular degeneration, left eye: Secondary | ICD-10-CM | POA: Diagnosis not present

## 2017-09-13 DIAGNOSIS — H353123 Nonexudative age-related macular degeneration, left eye, advanced atrophic without subfoveal involvement: Secondary | ICD-10-CM | POA: Diagnosis not present

## 2017-09-20 DIAGNOSIS — H35351 Cystoid macular degeneration, right eye: Secondary | ICD-10-CM | POA: Diagnosis not present

## 2017-09-20 DIAGNOSIS — H353123 Nonexudative age-related macular degeneration, left eye, advanced atrophic without subfoveal involvement: Secondary | ICD-10-CM | POA: Diagnosis not present

## 2017-09-20 DIAGNOSIS — H353211 Exudative age-related macular degeneration, right eye, with active choroidal neovascularization: Secondary | ICD-10-CM | POA: Diagnosis not present

## 2017-09-20 DIAGNOSIS — H353114 Nonexudative age-related macular degeneration, right eye, advanced atrophic with subfoveal involvement: Secondary | ICD-10-CM | POA: Diagnosis not present

## 2017-09-20 DIAGNOSIS — H353221 Exudative age-related macular degeneration, left eye, with active choroidal neovascularization: Secondary | ICD-10-CM | POA: Diagnosis not present

## 2017-11-08 DIAGNOSIS — H35352 Cystoid macular degeneration, left eye: Secondary | ICD-10-CM | POA: Diagnosis not present

## 2017-11-08 DIAGNOSIS — H353211 Exudative age-related macular degeneration, right eye, with active choroidal neovascularization: Secondary | ICD-10-CM | POA: Diagnosis not present

## 2017-11-08 DIAGNOSIS — H353221 Exudative age-related macular degeneration, left eye, with active choroidal neovascularization: Secondary | ICD-10-CM | POA: Diagnosis not present

## 2017-11-08 DIAGNOSIS — H353123 Nonexudative age-related macular degeneration, left eye, advanced atrophic without subfoveal involvement: Secondary | ICD-10-CM | POA: Diagnosis not present

## 2017-11-15 DIAGNOSIS — H353211 Exudative age-related macular degeneration, right eye, with active choroidal neovascularization: Secondary | ICD-10-CM | POA: Diagnosis not present

## 2017-11-15 DIAGNOSIS — H35351 Cystoid macular degeneration, right eye: Secondary | ICD-10-CM | POA: Diagnosis not present

## 2017-11-15 DIAGNOSIS — H43811 Vitreous degeneration, right eye: Secondary | ICD-10-CM | POA: Diagnosis not present

## 2017-11-18 DIAGNOSIS — S9032XA Contusion of left foot, initial encounter: Secondary | ICD-10-CM | POA: Diagnosis not present

## 2017-11-18 DIAGNOSIS — S93492A Sprain of other ligament of left ankle, initial encounter: Secondary | ICD-10-CM | POA: Diagnosis not present

## 2017-11-25 DIAGNOSIS — S9032XD Contusion of left foot, subsequent encounter: Secondary | ICD-10-CM | POA: Diagnosis not present

## 2017-11-26 DIAGNOSIS — M810 Age-related osteoporosis without current pathological fracture: Secondary | ICD-10-CM | POA: Diagnosis not present

## 2017-11-26 DIAGNOSIS — I10 Essential (primary) hypertension: Secondary | ICD-10-CM | POA: Diagnosis not present

## 2017-11-26 DIAGNOSIS — E7849 Other hyperlipidemia: Secondary | ICD-10-CM | POA: Diagnosis not present

## 2017-12-03 DIAGNOSIS — Z0000 Encounter for general adult medical examination without abnormal findings: Secondary | ICD-10-CM | POA: Diagnosis not present

## 2017-12-03 DIAGNOSIS — M545 Low back pain: Secondary | ICD-10-CM | POA: Diagnosis not present

## 2017-12-03 DIAGNOSIS — R2681 Unsteadiness on feet: Secondary | ICD-10-CM | POA: Diagnosis not present

## 2017-12-03 DIAGNOSIS — D649 Anemia, unspecified: Secondary | ICD-10-CM | POA: Diagnosis not present

## 2017-12-03 DIAGNOSIS — R5383 Other fatigue: Secondary | ICD-10-CM | POA: Diagnosis not present

## 2017-12-03 DIAGNOSIS — E7849 Other hyperlipidemia: Secondary | ICD-10-CM | POA: Diagnosis not present

## 2017-12-03 DIAGNOSIS — Z6823 Body mass index (BMI) 23.0-23.9, adult: Secondary | ICD-10-CM | POA: Diagnosis not present

## 2017-12-03 DIAGNOSIS — M1990 Unspecified osteoarthritis, unspecified site: Secondary | ICD-10-CM | POA: Diagnosis not present

## 2017-12-03 DIAGNOSIS — M810 Age-related osteoporosis without current pathological fracture: Secondary | ICD-10-CM | POA: Diagnosis not present

## 2017-12-03 DIAGNOSIS — I10 Essential (primary) hypertension: Secondary | ICD-10-CM | POA: Diagnosis not present

## 2017-12-03 DIAGNOSIS — Z1389 Encounter for screening for other disorder: Secondary | ICD-10-CM | POA: Diagnosis not present

## 2017-12-03 DIAGNOSIS — R634 Abnormal weight loss: Secondary | ICD-10-CM | POA: Diagnosis not present

## 2017-12-23 ENCOUNTER — Other Ambulatory Visit (HOSPITAL_COMMUNITY): Payer: Self-pay | Admitting: *Deleted

## 2017-12-24 ENCOUNTER — Ambulatory Visit (HOSPITAL_COMMUNITY)
Admission: RE | Admit: 2017-12-24 | Discharge: 2017-12-24 | Disposition: A | Discharge status: Home / Self Care | Payer: Medicare Other | Source: Ambulatory Visit | Attending: Internal Medicine | Admitting: Internal Medicine

## 2017-12-24 DIAGNOSIS — M810 Age-related osteoporosis without current pathological fracture: Secondary | ICD-10-CM | POA: Insufficient documentation

## 2017-12-24 MED ORDER — DENOSUMAB 60 MG/ML ~~LOC~~ SOSY
60.0000 mg | PREFILLED_SYRINGE | Freq: Once | SUBCUTANEOUS | Status: AC
  Administered 2017-12-24: 60 mg via SUBCUTANEOUS
  Filled 2017-12-24: qty 1

## 2018-01-03 DIAGNOSIS — H353221 Exudative age-related macular degeneration, left eye, with active choroidal neovascularization: Secondary | ICD-10-CM | POA: Diagnosis not present

## 2018-01-03 DIAGNOSIS — H353123 Nonexudative age-related macular degeneration, left eye, advanced atrophic without subfoveal involvement: Secondary | ICD-10-CM | POA: Diagnosis not present

## 2018-01-03 DIAGNOSIS — H353211 Exudative age-related macular degeneration, right eye, with active choroidal neovascularization: Secondary | ICD-10-CM | POA: Diagnosis not present

## 2018-01-03 DIAGNOSIS — H353114 Nonexudative age-related macular degeneration, right eye, advanced atrophic with subfoveal involvement: Secondary | ICD-10-CM | POA: Diagnosis not present

## 2018-01-10 DIAGNOSIS — H35351 Cystoid macular degeneration, right eye: Secondary | ICD-10-CM | POA: Diagnosis not present

## 2018-01-10 DIAGNOSIS — H43811 Vitreous degeneration, right eye: Secondary | ICD-10-CM | POA: Diagnosis not present

## 2018-01-10 DIAGNOSIS — H353211 Exudative age-related macular degeneration, right eye, with active choroidal neovascularization: Secondary | ICD-10-CM | POA: Diagnosis not present

## 2018-02-28 DIAGNOSIS — H353123 Nonexudative age-related macular degeneration, left eye, advanced atrophic without subfoveal involvement: Secondary | ICD-10-CM | POA: Diagnosis not present

## 2018-02-28 DIAGNOSIS — H353114 Nonexudative age-related macular degeneration, right eye, advanced atrophic with subfoveal involvement: Secondary | ICD-10-CM | POA: Diagnosis not present

## 2018-02-28 DIAGNOSIS — H353221 Exudative age-related macular degeneration, left eye, with active choroidal neovascularization: Secondary | ICD-10-CM | POA: Diagnosis not present

## 2018-02-28 DIAGNOSIS — H353211 Exudative age-related macular degeneration, right eye, with active choroidal neovascularization: Secondary | ICD-10-CM | POA: Diagnosis not present

## 2018-03-07 DIAGNOSIS — H35351 Cystoid macular degeneration, right eye: Secondary | ICD-10-CM | POA: Diagnosis not present

## 2018-03-07 DIAGNOSIS — H43811 Vitreous degeneration, right eye: Secondary | ICD-10-CM | POA: Diagnosis not present

## 2018-03-07 DIAGNOSIS — H353211 Exudative age-related macular degeneration, right eye, with active choroidal neovascularization: Secondary | ICD-10-CM | POA: Diagnosis not present

## 2018-04-25 DIAGNOSIS — H353221 Exudative age-related macular degeneration, left eye, with active choroidal neovascularization: Secondary | ICD-10-CM | POA: Diagnosis not present

## 2018-04-25 DIAGNOSIS — H43812 Vitreous degeneration, left eye: Secondary | ICD-10-CM | POA: Diagnosis not present

## 2018-04-25 DIAGNOSIS — H35352 Cystoid macular degeneration, left eye: Secondary | ICD-10-CM | POA: Diagnosis not present

## 2018-05-03 DIAGNOSIS — H353221 Exudative age-related macular degeneration, left eye, with active choroidal neovascularization: Secondary | ICD-10-CM | POA: Diagnosis not present

## 2018-05-03 DIAGNOSIS — H353114 Nonexudative age-related macular degeneration, right eye, advanced atrophic with subfoveal involvement: Secondary | ICD-10-CM | POA: Diagnosis not present

## 2018-05-03 DIAGNOSIS — H353211 Exudative age-related macular degeneration, right eye, with active choroidal neovascularization: Secondary | ICD-10-CM | POA: Diagnosis not present

## 2018-05-03 DIAGNOSIS — H353123 Nonexudative age-related macular degeneration, left eye, advanced atrophic without subfoveal involvement: Secondary | ICD-10-CM | POA: Diagnosis not present

## 2018-05-18 DIAGNOSIS — H6123 Impacted cerumen, bilateral: Secondary | ICD-10-CM | POA: Diagnosis not present

## 2018-05-23 DIAGNOSIS — Z23 Encounter for immunization: Secondary | ICD-10-CM | POA: Diagnosis not present

## 2018-06-20 DIAGNOSIS — H35352 Cystoid macular degeneration, left eye: Secondary | ICD-10-CM | POA: Diagnosis not present

## 2018-06-20 DIAGNOSIS — H43812 Vitreous degeneration, left eye: Secondary | ICD-10-CM | POA: Diagnosis not present

## 2018-06-20 DIAGNOSIS — H353221 Exudative age-related macular degeneration, left eye, with active choroidal neovascularization: Secondary | ICD-10-CM | POA: Diagnosis not present

## 2018-07-12 DIAGNOSIS — H353211 Exudative age-related macular degeneration, right eye, with active choroidal neovascularization: Secondary | ICD-10-CM | POA: Diagnosis not present

## 2018-07-12 DIAGNOSIS — H353114 Nonexudative age-related macular degeneration, right eye, advanced atrophic with subfoveal involvement: Secondary | ICD-10-CM | POA: Diagnosis not present

## 2018-07-12 DIAGNOSIS — H353123 Nonexudative age-related macular degeneration, left eye, advanced atrophic without subfoveal involvement: Secondary | ICD-10-CM | POA: Diagnosis not present

## 2018-07-12 DIAGNOSIS — H35351 Cystoid macular degeneration, right eye: Secondary | ICD-10-CM | POA: Diagnosis not present

## 2018-07-14 ENCOUNTER — Ambulatory Visit (HOSPITAL_COMMUNITY)
Admission: RE | Admit: 2018-07-14 | Discharge: 2018-07-14 | Disposition: A | Discharge status: Home / Self Care | Payer: Medicare Other | Source: Ambulatory Visit | Attending: Internal Medicine | Admitting: Internal Medicine

## 2018-07-14 DIAGNOSIS — M810 Age-related osteoporosis without current pathological fracture: Secondary | ICD-10-CM | POA: Insufficient documentation

## 2018-07-14 MED ORDER — DENOSUMAB 60 MG/ML ~~LOC~~ SOSY
PREFILLED_SYRINGE | SUBCUTANEOUS | Status: AC
  Administered 2018-07-14: 60 mg via SUBCUTANEOUS
  Filled 2018-07-14: qty 1

## 2018-07-14 MED ORDER — DENOSUMAB 60 MG/ML ~~LOC~~ SOSY
60.0000 mg | PREFILLED_SYRINGE | Freq: Once | SUBCUTANEOUS | Status: AC
  Administered 2018-07-14: 60 mg via SUBCUTANEOUS

## 2018-07-26 DIAGNOSIS — H353134 Nonexudative age-related macular degeneration, bilateral, advanced atrophic with subfoveal involvement: Secondary | ICD-10-CM | POA: Diagnosis not present

## 2018-07-26 DIAGNOSIS — H52203 Unspecified astigmatism, bilateral: Secondary | ICD-10-CM | POA: Diagnosis not present

## 2018-07-26 DIAGNOSIS — H26493 Other secondary cataract, bilateral: Secondary | ICD-10-CM | POA: Diagnosis not present

## 2018-07-26 DIAGNOSIS — H43813 Vitreous degeneration, bilateral: Secondary | ICD-10-CM | POA: Diagnosis not present

## 2018-08-15 DIAGNOSIS — H353221 Exudative age-related macular degeneration, left eye, with active choroidal neovascularization: Secondary | ICD-10-CM | POA: Diagnosis not present

## 2018-08-15 DIAGNOSIS — H353211 Exudative age-related macular degeneration, right eye, with active choroidal neovascularization: Secondary | ICD-10-CM | POA: Diagnosis not present

## 2018-08-15 DIAGNOSIS — H353123 Nonexudative age-related macular degeneration, left eye, advanced atrophic without subfoveal involvement: Secondary | ICD-10-CM | POA: Diagnosis not present

## 2018-09-27 DIAGNOSIS — H353211 Exudative age-related macular degeneration, right eye, with active choroidal neovascularization: Secondary | ICD-10-CM | POA: Diagnosis not present

## 2018-09-27 DIAGNOSIS — H353221 Exudative age-related macular degeneration, left eye, with active choroidal neovascularization: Secondary | ICD-10-CM | POA: Diagnosis not present

## 2018-09-27 DIAGNOSIS — H353123 Nonexudative age-related macular degeneration, left eye, advanced atrophic without subfoveal involvement: Secondary | ICD-10-CM | POA: Diagnosis not present

## 2018-09-27 DIAGNOSIS — H353114 Nonexudative age-related macular degeneration, right eye, advanced atrophic with subfoveal involvement: Secondary | ICD-10-CM | POA: Diagnosis not present

## 2018-10-10 DIAGNOSIS — H353114 Nonexudative age-related macular degeneration, right eye, advanced atrophic with subfoveal involvement: Secondary | ICD-10-CM | POA: Diagnosis not present

## 2018-10-10 DIAGNOSIS — H353211 Exudative age-related macular degeneration, right eye, with active choroidal neovascularization: Secondary | ICD-10-CM | POA: Diagnosis not present

## 2018-10-10 DIAGNOSIS — H353221 Exudative age-related macular degeneration, left eye, with active choroidal neovascularization: Secondary | ICD-10-CM | POA: Diagnosis not present

## 2018-10-10 DIAGNOSIS — H353123 Nonexudative age-related macular degeneration, left eye, advanced atrophic without subfoveal involvement: Secondary | ICD-10-CM | POA: Diagnosis not present

## 2018-12-05 DIAGNOSIS — I10 Essential (primary) hypertension: Secondary | ICD-10-CM | POA: Diagnosis not present

## 2018-12-05 DIAGNOSIS — M810 Age-related osteoporosis without current pathological fracture: Secondary | ICD-10-CM | POA: Diagnosis not present

## 2018-12-05 DIAGNOSIS — E7849 Other hyperlipidemia: Secondary | ICD-10-CM | POA: Diagnosis not present

## 2018-12-06 DIAGNOSIS — H35352 Cystoid macular degeneration, left eye: Secondary | ICD-10-CM | POA: Diagnosis not present

## 2018-12-06 DIAGNOSIS — H43812 Vitreous degeneration, left eye: Secondary | ICD-10-CM | POA: Diagnosis not present

## 2018-12-06 DIAGNOSIS — H353221 Exudative age-related macular degeneration, left eye, with active choroidal neovascularization: Secondary | ICD-10-CM | POA: Diagnosis not present

## 2018-12-12 DIAGNOSIS — D649 Anemia, unspecified: Secondary | ICD-10-CM | POA: Diagnosis not present

## 2018-12-12 DIAGNOSIS — R296 Repeated falls: Secondary | ICD-10-CM | POA: Diagnosis not present

## 2018-12-12 DIAGNOSIS — Z0000 Encounter for general adult medical examination without abnormal findings: Secondary | ICD-10-CM | POA: Diagnosis not present

## 2018-12-12 DIAGNOSIS — M810 Age-related osteoporosis without current pathological fracture: Secondary | ICD-10-CM | POA: Diagnosis not present

## 2018-12-12 DIAGNOSIS — H3530 Unspecified macular degeneration: Secondary | ICD-10-CM | POA: Diagnosis not present

## 2018-12-12 DIAGNOSIS — Z1331 Encounter for screening for depression: Secondary | ICD-10-CM | POA: Diagnosis not present

## 2018-12-12 DIAGNOSIS — R197 Diarrhea, unspecified: Secondary | ICD-10-CM | POA: Diagnosis not present

## 2018-12-12 DIAGNOSIS — R634 Abnormal weight loss: Secondary | ICD-10-CM | POA: Diagnosis not present

## 2018-12-12 DIAGNOSIS — E785 Hyperlipidemia, unspecified: Secondary | ICD-10-CM | POA: Diagnosis not present

## 2018-12-12 DIAGNOSIS — M545 Low back pain: Secondary | ICD-10-CM | POA: Diagnosis not present

## 2018-12-12 DIAGNOSIS — I10 Essential (primary) hypertension: Secondary | ICD-10-CM | POA: Diagnosis not present

## 2018-12-12 DIAGNOSIS — Z1339 Encounter for screening examination for other mental health and behavioral disorders: Secondary | ICD-10-CM | POA: Diagnosis not present

## 2018-12-13 DIAGNOSIS — H353123 Nonexudative age-related macular degeneration, left eye, advanced atrophic without subfoveal involvement: Secondary | ICD-10-CM | POA: Diagnosis not present

## 2018-12-13 DIAGNOSIS — H353211 Exudative age-related macular degeneration, right eye, with active choroidal neovascularization: Secondary | ICD-10-CM | POA: Diagnosis not present

## 2018-12-22 ENCOUNTER — Other Ambulatory Visit: Payer: Self-pay

## 2018-12-22 ENCOUNTER — Other Ambulatory Visit (HOSPITAL_COMMUNITY): Payer: Self-pay

## 2018-12-23 ENCOUNTER — Ambulatory Visit (HOSPITAL_COMMUNITY)
Admission: RE | Admit: 2018-12-23 | Discharge: 2018-12-23 | Disposition: A | Discharge status: Home / Self Care | Payer: Medicare Other | Source: Ambulatory Visit | Attending: Internal Medicine | Admitting: Internal Medicine

## 2018-12-23 ENCOUNTER — Other Ambulatory Visit: Payer: Self-pay

## 2018-12-23 DIAGNOSIS — M810 Age-related osteoporosis without current pathological fracture: Secondary | ICD-10-CM | POA: Diagnosis not present

## 2018-12-23 MED ORDER — DENOSUMAB 60 MG/ML ~~LOC~~ SOSY
60.0000 mg | PREFILLED_SYRINGE | Freq: Once | SUBCUTANEOUS | Status: AC
  Administered 2018-12-23: 60 mg via SUBCUTANEOUS

## 2018-12-23 MED ORDER — DENOSUMAB 60 MG/ML ~~LOC~~ SOSY
PREFILLED_SYRINGE | SUBCUTANEOUS | Status: AC
  Administered 2018-12-23: 60 mg via SUBCUTANEOUS
  Filled 2018-12-23: qty 1

## 2019-01-31 DIAGNOSIS — H353221 Exudative age-related macular degeneration, left eye, with active choroidal neovascularization: Secondary | ICD-10-CM | POA: Diagnosis not present

## 2019-01-31 DIAGNOSIS — H35352 Cystoid macular degeneration, left eye: Secondary | ICD-10-CM | POA: Diagnosis not present

## 2019-03-14 DIAGNOSIS — H353211 Exudative age-related macular degeneration, right eye, with active choroidal neovascularization: Secondary | ICD-10-CM | POA: Diagnosis not present

## 2019-03-14 DIAGNOSIS — H353114 Nonexudative age-related macular degeneration, right eye, advanced atrophic with subfoveal involvement: Secondary | ICD-10-CM | POA: Diagnosis not present

## 2019-04-06 ENCOUNTER — Encounter (HOSPITAL_COMMUNITY): Payer: Self-pay

## 2019-04-06 ENCOUNTER — Ambulatory Visit (INDEPENDENT_AMBULATORY_CARE_PROVIDER_SITE_OTHER): Payer: Medicare Other

## 2019-04-06 ENCOUNTER — Other Ambulatory Visit: Payer: Self-pay

## 2019-04-06 ENCOUNTER — Ambulatory Visit (HOSPITAL_COMMUNITY)
Admission: EM | Admit: 2019-04-06 | Discharge: 2019-04-06 | Disposition: A | Discharge status: Home / Self Care | Payer: Medicare Other | Attending: Urgent Care | Admitting: Urgent Care

## 2019-04-06 DIAGNOSIS — W19XXXA Unspecified fall, initial encounter: Secondary | ICD-10-CM | POA: Diagnosis not present

## 2019-04-06 DIAGNOSIS — M25561 Pain in right knee: Secondary | ICD-10-CM

## 2019-04-06 DIAGNOSIS — Z9181 History of falling: Secondary | ICD-10-CM

## 2019-04-06 DIAGNOSIS — M25461 Effusion, right knee: Secondary | ICD-10-CM

## 2019-04-06 DIAGNOSIS — S8992XA Unspecified injury of left lower leg, initial encounter: Secondary | ICD-10-CM | POA: Diagnosis not present

## 2019-04-06 DIAGNOSIS — M25562 Pain in left knee: Secondary | ICD-10-CM

## 2019-04-06 DIAGNOSIS — S8001XA Contusion of right knee, initial encounter: Secondary | ICD-10-CM

## 2019-04-06 DIAGNOSIS — W109XXA Fall (on) (from) unspecified stairs and steps, initial encounter: Secondary | ICD-10-CM | POA: Diagnosis not present

## 2019-04-06 MED ORDER — TRAMADOL-ACETAMINOPHEN 37.5-325 MG PO TABS: 1.0000 | ORAL_TABLET | Freq: Three times a day (TID) | ORAL | 0 refills | Status: AC | PRN

## 2019-04-06 NOTE — Discharge Instructions (Addendum)
Please make sure you continue to take Tylenol at 500mg -650mg  once every 6 hours as needed for your pain. If your pain becomes severe, then you can take stronger pain medication that I prescribed, Ultracet, 1 tablet every 8 hours as needed. Do not take this medication if your knee is not hurting.   Please make sure you contact one of the orthopedists listed below for further follow up and more imaging to rule out fracture: [facility] Phone: [phone]  [facility] Phone: [phone]  [facility] Phone: [phone]  [facility] Phone: [phone]

## 2019-04-06 NOTE — ED Triage Notes (Addendum)
Pt states she fell yesterday. Pt state she came home from the grocery store and somehow she fell by the porch at the door of the house . Pt has pain in her right leg pain.

## 2019-04-06 NOTE — ED Provider Notes (Signed)
MRN: [id number] DOB: 04-04-1922  Subjective:   Dawn Hampton is a 85 y.o. female presenting for 1 day history of bilateral knee pain, R>L. Patient reports that she fell going up the steps to her house yesterday while carrying her groceries. States it was accidental. Denies headache, confusion, dizziness, vision changes, weakness, chest pain at the time of the fall. States that her groceries were heavy and had a difficult time carrying them while also using her cane. Reports that her pain is aching/sharp, intermittent, has associated swelling of her right knee, mild redness of her left knee. She tried 2 APAP yesterday night with some relief.    No current facility-administered medications for this encounter.   Current Outpatient Medications:  .  amLODipine (NORVASC) 5 MG tablet, Take 5 mg by mouth daily., Disp: , Rfl:  .  beta carotene w/minerals (OCUVITE) tablet, Take 1 tablet by mouth daily., Disp: , Rfl:  .  calcium carbonate (TUMS - DOSED IN MG ELEMENTAL CALCIUM) 500 MG chewable tablet, Chew 1 tablet by mouth 2 (two) times daily. , Disp: , Rfl:  .  cephALEXin (KEFLEX) 500 MG capsule, Take 2 capsules (1,000 mg total) by mouth 2 (two) times daily. (Patient not taking: Reported on 02/01/2017), Disp: 28 capsule, Rfl: 0 .  iron polysaccharides (NIFEREX) 150 MG capsule, Take 150 mg by mouth 2 (two) times daily. , Disp: , Rfl:  .  loperamide (IMODIUM A-D) 2 MG tablet, Take 2 mg by mouth daily at 2 am., Disp: , Rfl:  .  loratadine (CLARITIN) 10 MG tablet, Take 10 mg by mouth daily., Disp: , Rfl:  .  losartan (COZAAR) 100 MG tablet, Take 100 mg by mouth daily., Disp: , Rfl:  .  Polyethyl Glycol-Propyl Glycol (SYSTANE OP), Apply 1 drop to eye 2 (two) times daily., Disp: , Rfl:  .  Vitamin D, Ergocalciferol, (DRISDOL) 50000 UNITS CAPS capsule, Take 50,000 Units by mouth every 7 (seven) days., Disp: , Rfl:    Allergies  Allergen Reactions  . Sulfa Antibiotics Itching  . Penicillins Swelling and  Rash    Swelling located at injection site    Past Medical History:  Diagnosis Date  . Abdominal mass, right lower quadrant   . Anemia   . Hypertension   . Macular degeneration   . Osteoporosis   . Shingles    T8     Past Surgical History:  Procedure Laterality Date  . cataracts  2004  . PUD, partial gastrectomy 1992    . TOTAL ABDOMINAL HYSTERECTOMY W/ BILATERAL SALPINGOOPHORECTOMY  1975    ROS  Objective:   Vitals: BP (!) 142/71 (BP Location: Right Arm)   Pulse 68   Temp 98.8 F (37.1 C) (Oral)   Resp 16   Wt 98 lb 12.8 oz (44.8 kg)   SpO2 98%   BMI 19.96 kg/m   Physical Exam Constitutional:      General: She is not in acute distress.    Appearance: Normal appearance. She is well-developed. She is not ill-appearing, toxic-appearing or diaphoretic.  HENT:     Head: Normocephalic and atraumatic.     Nose: Nose normal.     Mouth/Throat:     Mouth: Mucous membranes are moist.     Pharynx: Oropharynx is clear.  Eyes:     General: No scleral icterus.    Extraocular Movements: Extraocular movements intact.     Pupils: Pupils are equal, round, and reactive to light.  Cardiovascular:     Rate and  Rhythm: Normal rate and regular rhythm.     Pulses: Normal pulses.     Heart sounds: Normal heart sounds. No murmur. No friction rub. No gallop.   Pulmonary:     Effort: Pulmonary effort is normal. No respiratory distress.     Breath sounds: Normal breath sounds. No stridor. No wheezing, rhonchi or rales.  Musculoskeletal:     Right knee: She exhibits decreased range of motion and swelling (trace medially). She exhibits no ecchymosis, no deformity, no erythema, normal patellar mobility and no bony tenderness. Tenderness found. Medial joint line and patellar tendon tenderness noted. No lateral joint line tenderness noted.  Skin:    General: Skin is warm and dry.     Findings: No rash.  Neurological:     General: No focal deficit present.     Mental Status: She is  alert and oriented to person, place, and time.  Psychiatric:        Mood and Affect: Mood normal.        Behavior: Behavior normal.        Thought Content: Thought content normal.     Dg Knee Complete 4 Views Left  Result Date: 04/06/2019 CLINICAL DATA:  Bilateral knee pain after fall EXAM: RIGHT KNEE - COMPLETE 4+ VIEW; LEFT KNEE - COMPLETE 4+ VIEW COMPARISON:  12/24/2008 FINDINGS: No acute, displaced fracture. No malalignment. Joint spaces are relatively preserved. There is a small right knee joint effusion. No left knee joint effusion. Scattered vascular calcifications. Bones are demineralized. IMPRESSION: 1. No acute fracture identified. 2. Given the osseous demineralization and the presence of a small RIGHT knee joint effusion, a radiographically occult fracture is not entirely excluded. If clinically warranted, consider further evaluation with CT or MRI. Electronically Signed   By: Davina Poke M.D.   On: 04/06/2019 11:23   Dg Knee Complete 4 Views Right  Result Date: 04/06/2019 CLINICAL DATA:  Bilateral knee pain after fall EXAM: RIGHT KNEE - COMPLETE 4+ VIEW; LEFT KNEE - COMPLETE 4+ VIEW COMPARISON:  12/24/2008 FINDINGS: No acute, displaced fracture. No malalignment. Joint spaces are relatively preserved. There is a small right knee joint effusion. No left knee joint effusion. Scattered vascular calcifications. Bones are demineralized. IMPRESSION: 1. No acute fracture identified. 2. Given the osseous demineralization and the presence of a small RIGHT knee joint effusion, a radiographically occult fracture is not entirely excluded. If clinically warranted, consider further evaluation with CT or MRI. Electronically Signed   By: Davina Poke M.D.   On: 04/06/2019 11:23    Assessment and Plan :   1. Fall, initial encounter   2. Acute bilateral knee pain   3. At risk for falls   4. Effusion of right knee   5. Pain and swelling of right knee   6. Contusion of right knee, initial  encounter     Reassuring but inconclusive imaging reviewed with patient. She is to take APAP and use Ultracet for breakthrough pain. Recommended she reach out to [facility] for a consult and consideration for further imaging. Counseled patient on potential for adverse effects with medications prescribed/recommended today, ER and return-to-clinic precautions discussed, patient verbalized understanding.    Jaynee Eagles, PA-C 04/06/19 1150

## 2019-04-07 DIAGNOSIS — M25561 Pain in right knee: Secondary | ICD-10-CM | POA: Diagnosis not present

## 2019-04-11 DIAGNOSIS — H35352 Cystoid macular degeneration, left eye: Secondary | ICD-10-CM | POA: Diagnosis not present

## 2019-04-11 DIAGNOSIS — H353221 Exudative age-related macular degeneration, left eye, with active choroidal neovascularization: Secondary | ICD-10-CM | POA: Diagnosis not present

## 2019-04-11 DIAGNOSIS — H353123 Nonexudative age-related macular degeneration, left eye, advanced atrophic without subfoveal involvement: Secondary | ICD-10-CM | POA: Diagnosis not present

## 2019-05-29 DIAGNOSIS — Z23 Encounter for immunization: Secondary | ICD-10-CM | POA: Diagnosis not present

## 2019-06-07 DIAGNOSIS — H353123 Nonexudative age-related macular degeneration, left eye, advanced atrophic without subfoveal involvement: Secondary | ICD-10-CM | POA: Diagnosis not present

## 2019-06-07 DIAGNOSIS — H35352 Cystoid macular degeneration, left eye: Secondary | ICD-10-CM | POA: Diagnosis not present

## 2019-06-07 DIAGNOSIS — H43812 Vitreous degeneration, left eye: Secondary | ICD-10-CM | POA: Diagnosis not present

## 2019-06-07 DIAGNOSIS — H353221 Exudative age-related macular degeneration, left eye, with active choroidal neovascularization: Secondary | ICD-10-CM | POA: Diagnosis not present

## 2019-06-19 DIAGNOSIS — H353211 Exudative age-related macular degeneration, right eye, with active choroidal neovascularization: Secondary | ICD-10-CM | POA: Diagnosis not present

## 2019-06-19 DIAGNOSIS — H353221 Exudative age-related macular degeneration, left eye, with active choroidal neovascularization: Secondary | ICD-10-CM | POA: Diagnosis not present

## 2019-07-25 DIAGNOSIS — H35352 Cystoid macular degeneration, left eye: Secondary | ICD-10-CM | POA: Diagnosis not present

## 2019-07-25 DIAGNOSIS — H353221 Exudative age-related macular degeneration, left eye, with active choroidal neovascularization: Secondary | ICD-10-CM | POA: Diagnosis not present

## 2019-07-25 DIAGNOSIS — H353211 Exudative age-related macular degeneration, right eye, with active choroidal neovascularization: Secondary | ICD-10-CM | POA: Diagnosis not present

## 2019-08-09 DIAGNOSIS — H40023 Open angle with borderline findings, high risk, bilateral: Secondary | ICD-10-CM | POA: Diagnosis not present

## 2019-08-09 DIAGNOSIS — H43813 Vitreous degeneration, bilateral: Secondary | ICD-10-CM | POA: Diagnosis not present

## 2019-08-09 DIAGNOSIS — H524 Presbyopia: Secondary | ICD-10-CM | POA: Diagnosis not present

## 2019-08-09 DIAGNOSIS — H353134 Nonexudative age-related macular degeneration, bilateral, advanced atrophic with subfoveal involvement: Secondary | ICD-10-CM | POA: Diagnosis not present

## 2019-09-11 DIAGNOSIS — H353211 Exudative age-related macular degeneration, right eye, with active choroidal neovascularization: Secondary | ICD-10-CM | POA: Diagnosis not present

## 2019-09-11 DIAGNOSIS — H353114 Nonexudative age-related macular degeneration, right eye, advanced atrophic with subfoveal involvement: Secondary | ICD-10-CM | POA: Diagnosis not present

## 2019-09-11 DIAGNOSIS — H35351 Cystoid macular degeneration, right eye: Secondary | ICD-10-CM | POA: Diagnosis not present

## 2019-09-18 DIAGNOSIS — H353221 Exudative age-related macular degeneration, left eye, with active choroidal neovascularization: Secondary | ICD-10-CM | POA: Diagnosis not present

## 2019-09-18 DIAGNOSIS — H353114 Nonexudative age-related macular degeneration, right eye, advanced atrophic with subfoveal involvement: Secondary | ICD-10-CM | POA: Diagnosis not present

## 2019-09-18 DIAGNOSIS — H353123 Nonexudative age-related macular degeneration, left eye, advanced atrophic without subfoveal involvement: Secondary | ICD-10-CM | POA: Diagnosis not present

## 2019-09-18 DIAGNOSIS — H353211 Exudative age-related macular degeneration, right eye, with active choroidal neovascularization: Secondary | ICD-10-CM | POA: Diagnosis not present

## 2019-11-13 DIAGNOSIS — H35352 Cystoid macular degeneration, left eye: Secondary | ICD-10-CM | POA: Diagnosis not present

## 2019-11-13 DIAGNOSIS — H35053 Retinal neovascularization, unspecified, bilateral: Secondary | ICD-10-CM | POA: Diagnosis not present

## 2019-11-13 DIAGNOSIS — H353221 Exudative age-related macular degeneration, left eye, with active choroidal neovascularization: Secondary | ICD-10-CM | POA: Diagnosis not present

## 2019-11-13 DIAGNOSIS — H43812 Vitreous degeneration, left eye: Secondary | ICD-10-CM | POA: Diagnosis not present

## 2019-11-13 DIAGNOSIS — H353211 Exudative age-related macular degeneration, right eye, with active choroidal neovascularization: Secondary | ICD-10-CM | POA: Diagnosis not present

## 2019-12-11 ENCOUNTER — Other Ambulatory Visit: Payer: Self-pay

## 2019-12-11 ENCOUNTER — Encounter (INDEPENDENT_AMBULATORY_CARE_PROVIDER_SITE_OTHER): Payer: Self-pay | Admitting: Ophthalmology

## 2019-12-11 ENCOUNTER — Ambulatory Visit (INDEPENDENT_AMBULATORY_CARE_PROVIDER_SITE_OTHER): Payer: Medicare Other | Admitting: Ophthalmology

## 2019-12-11 DIAGNOSIS — H35352 Cystoid macular degeneration, left eye: Secondary | ICD-10-CM

## 2019-12-11 DIAGNOSIS — H353221 Exudative age-related macular degeneration, left eye, with active choroidal neovascularization: Secondary | ICD-10-CM

## 2019-12-11 DIAGNOSIS — H353123 Nonexudative age-related macular degeneration, left eye, advanced atrophic without subfoveal involvement: Secondary | ICD-10-CM

## 2019-12-11 DIAGNOSIS — H353211 Exudative age-related macular degeneration, right eye, with active choroidal neovascularization: Secondary | ICD-10-CM | POA: Insufficient documentation

## 2019-12-11 DIAGNOSIS — H353114 Nonexudative age-related macular degeneration, right eye, advanced atrophic with subfoveal involvement: Secondary | ICD-10-CM | POA: Diagnosis not present

## 2019-12-11 DIAGNOSIS — H43812 Vitreous degeneration, left eye: Secondary | ICD-10-CM | POA: Insufficient documentation

## 2019-12-11 MED ORDER — RANIBIZUMAB 0.5 MG/0.05ML IZ SOLN FOR KALEIDOSCOPE
0.5000 mg | INTRAVITREAL | Status: AC | PRN
  Administered 2019-12-11: 12:00:00 .5 mg via INTRAVITREAL

## 2019-12-11 NOTE — Assessment & Plan Note (Signed)

## 2019-12-11 NOTE — Assessment & Plan Note (Signed)

## 2019-12-11 NOTE — Progress Notes (Signed)
12/11/2019     CHIEF COMPLAINT Patient presents for Retina Follow Up   HISTORY OF PRESENT ILLNESS: Dawn Hampton is a 86 y.o. female who presents to the clinic today for:   HPI    Retina Follow Up    Patient presents with  Wet AMD.  In right eye.  Duration of 3 months.  Since onset it is stable.          Comments    3 month follow up - OCT OU, Possible Lucentis OD Patient denies change in vision and overall has no complaints.        Last edited by Gerda Diss, [place] on 12/11/2019 10:16 AM. (History)      Referring physician: Leanna Battles, MD [place],   [id number]  HISTORICAL INFORMATION:   Selected notes from the MEDICAL RECORD NUMBER       CURRENT MEDICATIONS: Current Outpatient Medications (Ophthalmic Drugs)  Medication Sig  . Polyethyl Glycol-Propyl Glycol (SYSTANE OP) Apply 1 drop to eye 2 (two) times daily.   No current facility-administered medications for this visit. (Ophthalmic Drugs)   Current Outpatient Medications (Other)  Medication Sig  . amLODipine (NORVASC) 5 MG tablet Take 5 mg by mouth daily.  . beta carotene w/minerals (OCUVITE) tablet Take 1 tablet by mouth daily.  . calcium carbonate (TUMS - DOSED IN MG ELEMENTAL CALCIUM) 500 MG chewable tablet Chew 1 tablet by mouth 2 (two) times daily.   Marland Kitchen loperamide (IMODIUM A-D) 2 MG tablet Take 2 mg by mouth daily at 2 am.  . loratadine (CLARITIN) 10 MG tablet Take 10 mg by mouth daily.  Marland Kitchen losartan (COZAAR) 100 MG tablet Take 100 mg by mouth daily.  Marland Kitchen losartan-hydrochlorothiazide (HYZAAR) 100-25 MG tablet Take 1 tablet by mouth daily.  . Vitamin D, Ergocalciferol, (DRISDOL) 50000 UNITS CAPS capsule Take 50,000 Units by mouth every 7 (seven) days.  . cephALEXin (KEFLEX) 500 MG capsule Take 2 capsules (1,000 mg total) by mouth 2 (two) times daily. (Patient not taking: Reported on 02/01/2017)  . iron polysaccharides (NIFEREX) 150 MG capsule Take 150 mg by mouth 2 (two) times daily.     No current facility-administered medications for this visit. (Other)      REVIEW OF SYSTEMS:    ALLERGIES Allergies  Allergen Reactions  . Sulfa Antibiotics Itching  . Penicillins Swelling and Rash    Swelling located at injection site    PAST MEDICAL HISTORY Past Medical History:  Diagnosis Date  . Abdominal mass, right lower quadrant   . Anemia   . Hypertension   . Macular degeneration   . Osteoporosis   . Shingles    T8   Past Surgical History:  Procedure Laterality Date  . cataracts  2004  . PUD, partial gastrectomy 1992    . TOTAL ABDOMINAL HYSTERECTOMY W/ BILATERAL SALPINGOOPHORECTOMY  1975    FAMILY HISTORY History reviewed. No pertinent family history.  SOCIAL HISTORY Social History   Tobacco Use  . Smoking status: Never Smoker  . Smokeless tobacco: Never Used  Substance Use Topics  . Alcohol use: No  . Drug use: Not on file         OPHTHALMIC EXAM:  Base Eye Exam    Visual Acuity (Snellen - Linear)      Right Left   Dist cc 20/100-2 20/40-2   Dist ph cc NI NI   Correction: Glasses       Tonometry (Tonopen, 10:26 AM)  Right Left   Pressure 12 15       Pupils      Dark Light Shape React APD   Right 3 2 Round Slow None   Left 3 2 Round Slow None       Visual Fields (Counting fingers)      Left Right    Full Full       Extraocular Movement      Right Left    Full Full       Neuro/Psych    Oriented x3: Yes   Mood/Affect: Normal       Dilation    Right eye: 1.0% Mydriacyl, 2.5% Phenylephrine @ 10:26 AM        Slit Lamp and Fundus Exam    External Exam      Right Left   External Normal Normal       Slit Lamp Exam      Right Left   Lids/Lashes Normal Normal   Conjunctiva/Sclera White and quiet White and quiet   Cornea Clear Clear   Anterior Chamber Deep and quiet Deep and quiet   Iris Round and reactive Round and reactive   Lens Posterior chamber intraocular lens Posterior chamber intraocular lens    Anterior Vitreous Normal Normal       Fundus Exam      Right Left   Posterior Vitreous Posterior vitreous detachment    Disc Normal    C/D Ratio 0.5    Macula Age related macular degeneration, Advanced age related macular degeneration, Atrophy, Retinal pigment epithelial atrophy, Retinal pigment epithelial mottling    Vessels Normal    Periphery Normal           IMAGING AND PROCEDURES  Imaging and Procedures for @TODAY @  OCT, Retina - OU - Both Eyes       Right Eye Quality was good. Central Foveal Thickness: 268. Progression has been stable. Findings include outer retinal atrophy, central retinal atrophy, cystoid macular edema.   Left Eye Quality was good. Scan locations included subfoveal. Central Foveal Thickness: 255. Findings include no SRF, outer retinal tubulation, outer retinal atrophy, inner retinal atrophy.   Notes OD, stable at 41-month interval on lucentius.  OS also stable on lucentis       Intravitreal Injection, Pharmacologic Agent - OD - Right Eye       Time Out 12/11/2019. 11:28 AM. Confirmed correct patient, procedure, site, and patient consented.   Anesthesia Anesthetic medications included Akten 3.5%.   Procedure Preparation included 10% betadine to eyelids, Tobramycin 0.3%.   Injection:  0.5 mg ranibizumab (LUCENTIS) 0.5 MG/0.05ML intravitreal injection   NDC: 50242-080-01   Route: Intravitreal, Site: Right Eye, Waste: 0 mg  Post-op Post injection exam found visual acuity of at least counting fingers. The patient tolerated the procedure well. There were no complications. The patient received written and verbal post procedure care education. Post injection medications were not given.                 ASSESSMENT/PLAN:  @PROBAPNOTE @    ICD-10-CM   1. Exudative age-related macular degeneration of right eye with active choroidal neovascularization (HCC)  H35.3211 OCT, Retina - OU - Both Eyes    Intravitreal Injection, Pharmacologic  Agent - OD - Right Eye    ranibizumab (LUCENTIS) 0.5 MG/0.05ML intravitreal injection 0.5 mg  2. Advanced nonexudative age-related macular degeneration of left eye without subfoveal involvement  H35.3123 OCT, Retina - OU - Both Eyes  3. Advanced nonexudative  age-related macular degeneration of right eye with subfoveal involvement  H35.3114   4. Exudative age-related macular degeneration of left eye with active choroidal neovascularization ([place])  H35.3221   5. Cystoid macular edema of left eye  H35.352   6. Posterior vitreous detachment of left eye  H43.812     1.  2.  3.  Ophthalmic Meds Ordered this visit:  Meds ordered this encounter  Medications  . ranibizumab (LUCENTIS) 0.5 MG/0.05ML intravitreal injection 0.5 mg       Return in about 3 months (around 03/11/2020) for LUCENTIS OCT, OD.  There are no Patient Instructions on file for this visit.   Explained the diagnoses, plan, and follow up with the patient and they expressed understanding.  Patient expressed understanding of the importance of proper follow up care.   Clent Demark Martie Muhlbauer M.D. Diseases & Surgery of the Retina and Vitreous Retina & Diabetic [place] @TODAY @     Abbreviations: M myopia (nearsighted); A astigmatism; H hyperopia (farsighted); P presbyopia; Mrx spectacle prescription;  CTL contact lenses; OD right eye; OS left eye; OU both eyes  XT exotropia; ET esotropia; PEK punctate epithelial keratitis; PEE punctate epithelial erosions; DES dry eye syndrome; MGD meibomian gland dysfunction; ATs artificial tears; PFAT's preservative free artificial tears; [place] nuclear sclerotic cataract; PSC posterior subcapsular cataract; ERM epi-retinal membrane; PVD posterior vitreous detachment; RD retinal detachment; DM diabetes mellitus; DR diabetic retinopathy; NPDR non-proliferative diabetic retinopathy; PDR proliferative diabetic retinopathy; CSME clinically significant macular edema; DME diabetic macular edema; dbh dot blot  hemorrhages; CWS cotton wool spot; POAG primary open angle glaucoma; C/D cup-to-disc ratio; HVF humphrey visual field; GVF goldmann visual field; OCT optical coherence tomography; IOP intraocular pressure; BRVO Branch retinal vein occlusion; CRVO central retinal vein occlusion; CRAO central retinal artery occlusion; BRAO branch retinal artery occlusion; RT retinal tear; SB scleral buckle; PPV pars plana vitrectomy; VH Vitreous hemorrhage; PRP panretinal laser photocoagulation; IVK intravitreal kenalog; VMT vitreomacular traction; MH Macular hole;  NVD neovascularization of the disc; NVE neovascularization elsewhere; AREDS age related eye disease study; ARMD age related macular degeneration; POAG primary open angle glaucoma; EBMD epithelial/anterior basement membrane dystrophy; ACIOL anterior chamber intraocular lens; IOL intraocular lens; PCIOL posterior chamber intraocular lens; Phaco/IOL phacoemulsification with intraocular lens placement; [place] photorefractive keratectomy; LASIK laser assisted in situ keratomileusis; HTN hypertension; DM diabetes mellitus; COPD chronic obstructive pulmonary disease

## 2019-12-12 DIAGNOSIS — M810 Age-related osteoporosis without current pathological fracture: Secondary | ICD-10-CM | POA: Diagnosis not present

## 2019-12-12 DIAGNOSIS — E7849 Other hyperlipidemia: Secondary | ICD-10-CM | POA: Diagnosis not present

## 2020-01-01 ENCOUNTER — Encounter (INDEPENDENT_AMBULATORY_CARE_PROVIDER_SITE_OTHER): Payer: Self-pay | Admitting: Ophthalmology

## 2020-01-01 ENCOUNTER — Other Ambulatory Visit: Payer: Self-pay

## 2020-01-01 ENCOUNTER — Ambulatory Visit (INDEPENDENT_AMBULATORY_CARE_PROVIDER_SITE_OTHER): Payer: Medicare Other | Admitting: Ophthalmology

## 2020-01-01 DIAGNOSIS — H353221 Exudative age-related macular degeneration, left eye, with active choroidal neovascularization: Secondary | ICD-10-CM | POA: Diagnosis not present

## 2020-01-01 MED ORDER — RANIBIZUMAB 0.5 MG/0.05ML IZ SOLN FOR KALEIDOSCOPE
0.5000 mg | INTRAVITREAL | Status: AC | PRN
  Administered 2020-01-01: 11:00:00 .5 mg via INTRAVITREAL

## 2020-01-01 NOTE — Assessment & Plan Note (Signed)
The nature of posterior vitreous detachment was discussed with the patient as well as its physiology, its age prevalence, and its possible implication regarding retinal breaks and detachment.  An informational brochure was given to the patient.  All the patient's questions were answered.  The patient was asked to return if new or different flashes or floaters develops.   Patient was instructed to contact office immediately if any changes were noticed. I explained to the patient that vitreous inside the eye is similar to jello inside a bowl. As the jello melts it can start to pull away from the bowl, similarly the vitreous throughout our lives can begin to pull away from the retina. That process is called a posterior vitreous detachment. In some cases, the vitreous can tug hard enough on the retina to form a retinal tear. I discussed with the patient the signs and symptoms of a retinal detachment.  Do not rub the eye.  OS, stable currently on 7-week interval.  Repeat intravitreal Lucentis OS today and examination in 7 to 8 weeks

## 2020-01-01 NOTE — Progress Notes (Signed)
01/01/2020     CHIEF COMPLAINT Patient presents for Retina Follow Up   HISTORY OF PRESENT ILLNESS: Dawn Hampton is a 86 y.o. female who presents to the clinic today for:   HPI    Retina Follow Up    Patient presents with  Wet AMD.  In left eye.  Duration of 7 weeks.  Since onset it is stable.          Comments    7 week follow up- OCT OU,  Possible Lucentis OS Patient denies change in vision and overall has no complaints.        Last edited by Gerda Diss on 01/01/2020 10:21 AM. (History)      Referring physician: Leanna Battles, MD [place],  [place] [address]  HISTORICAL INFORMATION:   Selected notes from the MEDICAL RECORD NUMBER       CURRENT MEDICATIONS: Current Outpatient Medications (Ophthalmic Drugs)  Medication Sig  . Polyethyl Glycol-Propyl Glycol (SYSTANE OP) Apply 1 drop to eye 2 (two) times daily.   No current facility-administered medications for this visit. (Ophthalmic Drugs)   Current Outpatient Medications (Other)  Medication Sig  . amLODipine (NORVASC) 5 MG tablet Take 5 mg by mouth daily.  . beta carotene w/minerals (OCUVITE) tablet Take 1 tablet by mouth daily.  . calcium carbonate (TUMS - DOSED IN MG ELEMENTAL CALCIUM) 500 MG chewable tablet Chew 1 tablet by mouth 2 (two) times daily.   . cephALEXin (KEFLEX) 500 MG capsule Take 2 capsules (1,000 mg total) by mouth 2 (two) times daily. (Patient not taking: Reported on 02/01/2017)  . iron polysaccharides (NIFEREX) 150 MG capsule Take 150 mg by mouth 2 (two) times daily.   Marland Kitchen loperamide (IMODIUM A-D) 2 MG tablet Take 2 mg by mouth daily at 2 am.  . loratadine (CLARITIN) 10 MG tablet Take 10 mg by mouth daily.  Marland Kitchen losartan (COZAAR) 100 MG tablet Take 100 mg by mouth daily.  Marland Kitchen losartan-hydrochlorothiazide (HYZAAR) 100-25 MG tablet Take 1 tablet by mouth daily.  . Vitamin D, Ergocalciferol, (DRISDOL) 50000 UNITS CAPS capsule Take 50,000 Units by mouth every 7 (seven) days.   No  current facility-administered medications for this visit. (Other)      REVIEW OF SYSTEMS:    ALLERGIES Allergies  Allergen Reactions  . Sulfa Antibiotics Itching  . Penicillins Swelling and Rash    Swelling located at injection site    PAST MEDICAL HISTORY Past Medical History:  Diagnosis Date  . Abdominal mass, right lower quadrant   . Anemia   . Hypertension   . Macular degeneration   . Osteoporosis   . Shingles    T8   Past Surgical History:  Procedure Laterality Date  . cataracts  2004  . PUD, partial gastrectomy 1992    . TOTAL ABDOMINAL HYSTERECTOMY W/ BILATERAL SALPINGOOPHORECTOMY  1975    FAMILY HISTORY History reviewed. No pertinent family history.  SOCIAL HISTORY Social History   Tobacco Use  . Smoking status: Never Smoker  . Smokeless tobacco: Never Used  Substance Use Topics  . Alcohol use: No  . Drug use: Not on file         OPHTHALMIC EXAM:  Base Eye Exam    Visual Acuity (Snellen - Linear)      Right Left   Dist cc 20/60+2 20/50+1   Dist ph cc NI 20/40-2   Correction: Glasses       Tonometry (Tonopen, 10:29 AM)  Right Left   Pressure 12 18       Pupils      Pupils Dark Light Shape React APD   Right PERRL 3 2 Round Minimal None   Left PERRL 3 2 Round Minimal None       Visual Fields (Counting fingers)      Left Right    Full Full       Extraocular Movement      Right Left    Full Full       Neuro/Psych    Oriented x3: Yes   Mood/Affect: Normal       Dilation    Left eye: 1.0% Mydriacyl, 2.5% Phenylephrine @ 10:29 AM        Slit Lamp and Fundus Exam    External Exam      Right Left   External Normal Normal       Slit Lamp Exam      Right Left   Lids/Lashes Normal Normal   Conjunctiva/Sclera White and quiet White and quiet   Cornea Clear Clear   Anterior Chamber Deep and quiet Deep and quiet   Iris Round and reactive Round and reactive   Lens Posterior chamber intraocular lens Posterior chamber  intraocular lens   Anterior Vitreous Normal Normal       Fundus Exam      Right Left   Posterior Vitreous  Posterior vitreous detachment   C/D Ratio  0.65   Macula  Geographic atrophy, Advanced age related macular degeneration, Hard drusen, no exudates, no hemorrhage, no macular thickening, Subretinal neovascular membrane, Retinal pigment epithelial mottling          IMAGING AND PROCEDURES  Imaging and Procedures for 01/01/20           ASSESSMENT/PLAN:  Exudative age-related macular degeneration of left eye with active choroidal neovascularization (HCC)   The nature of posterior vitreous detachment was discussed with the patient as well as its physiology, its age prevalence, and its possible implication regarding retinal breaks and detachment.  An informational brochure was given to the patient.  All the patient's questions were answered.  The patient was asked to return if new or different flashes or floaters develops.   Patient was instructed to contact office immediately if any changes were noticed. I explained to the patient that vitreous inside the eye is similar to jello inside a bowl. As the jello melts it can start to pull away from the bowl, similarly the vitreous throughout our lives can begin to pull away from the retina. That process is called a posterior vitreous detachment. In some cases, the vitreous can tug hard enough on the retina to form a retinal tear. I discussed with the patient the signs and symptoms of a retinal detachment.  Do not rub the eye.  OS, stable currently on 7-week interval.  Repeat intravitreal Lucentis OS today and examination in 7 to 8 weeks      ICD-10-CM   1. Exudative age-related macular degeneration of left eye with active choroidal neovascularization ([place])  H35.3221     1.OD, with significant perifoveal atrophy, less active CN VM.  OS, on Lucentis 0.5 in the past, Currently at 7-week interval OS.  Stable findings.  Repeat Lucentis today  to maintain and examination OS in 7 weeks  2.  Follow-up 7-week examination left eye  3.  OD, is currently scheduled for follow-up examination and possible antiveg F therapy  Ophthalmic Meds Ordered this visit:  No orders  of the defined types were placed in this encounter.      No follow-ups on file.  There are no Patient Instructions on file for this visit.   Explained the diagnoses, plan, and follow up with the patient and they expressed understanding.  Patient expressed understanding of the importance of proper follow up care.   Clent Demark Jhanae Jaskowiak M.D. Diseases & Surgery of the Retina and Vitreous Retina & Diabetic [place] 01/01/20     Abbreviations: M myopia (nearsighted); A astigmatism; H hyperopia (farsighted); P presbyopia; Mrx spectacle prescription;  CTL contact lenses; OD right eye; OS left eye; OU both eyes  XT exotropia; ET esotropia; PEK punctate epithelial keratitis; PEE punctate epithelial erosions; DES dry eye syndrome; MGD meibomian gland dysfunction; ATs artificial tears; PFAT's preservative free artificial tears; [place] nuclear sclerotic cataract; PSC posterior subcapsular cataract; ERM epi-retinal membrane; PVD posterior vitreous detachment; RD retinal detachment; DM diabetes mellitus; DR diabetic retinopathy; NPDR non-proliferative diabetic retinopathy; PDR proliferative diabetic retinopathy; CSME clinically significant macular edema; DME diabetic macular edema; dbh dot blot hemorrhages; CWS cotton wool spot; POAG primary open angle glaucoma; C/D cup-to-disc ratio; HVF humphrey visual field; GVF goldmann visual field; OCT optical coherence tomography; IOP intraocular pressure; BRVO Branch retinal vein occlusion; CRVO central retinal vein occlusion; CRAO central retinal artery occlusion; BRAO branch retinal artery occlusion; RT retinal tear; SB scleral buckle; PPV pars plana vitrectomy; VH Vitreous hemorrhage; PRP panretinal laser photocoagulation; IVK intravitreal  kenalog; VMT vitreomacular traction; MH Macular hole;  NVD neovascularization of the disc; NVE neovascularization elsewhere; AREDS age related eye disease study; ARMD age related macular degeneration; POAG primary open angle glaucoma; EBMD epithelial/anterior basement membrane dystrophy; ACIOL anterior chamber intraocular lens; IOL intraocular lens; PCIOL posterior chamber intraocular lens; Phaco/IOL phacoemulsification with intraocular lens placement; [place] photorefractive keratectomy; LASIK laser assisted in situ keratomileusis; HTN hypertension; DM diabetes mellitus; COPD chronic obstructive pulmonary disease

## 2020-01-02 ENCOUNTER — Encounter (INDEPENDENT_AMBULATORY_CARE_PROVIDER_SITE_OTHER): Payer: Medicare Other | Admitting: Ophthalmology

## 2020-01-11 ENCOUNTER — Ambulatory Visit (INDEPENDENT_AMBULATORY_CARE_PROVIDER_SITE_OTHER): Payer: Medicare Other | Admitting: Otolaryngology

## 2020-01-11 ENCOUNTER — Other Ambulatory Visit: Payer: Self-pay

## 2020-01-11 ENCOUNTER — Encounter (INDEPENDENT_AMBULATORY_CARE_PROVIDER_SITE_OTHER): Payer: Self-pay | Admitting: Otolaryngology

## 2020-01-11 VITALS — Temp 97.7°F

## 2020-01-11 DIAGNOSIS — H6123 Impacted cerumen, bilateral: Secondary | ICD-10-CM | POA: Diagnosis not present

## 2020-01-11 NOTE — Progress Notes (Signed)
HPI: Dawn Hampton is a 86 y.o. female who presents for evaluation of wax buildup in her ears.  She has decreased hearing in both ears..  Past Medical History:  Diagnosis Date  . Abdominal mass, right lower quadrant   . Anemia   . Hypertension   . Macular degeneration   . Osteoporosis   . Shingles    T8   Past Surgical History:  Procedure Laterality Date  . cataracts  2004  . PUD, partial gastrectomy 1992    . TOTAL ABDOMINAL HYSTERECTOMY W/ BILATERAL SALPINGOOPHORECTOMY  1975   Social History   Socioeconomic History  . Marital status: Widowed    Spouse name: Not on file  . Number of children: Not on file  . Years of education: Not on file  . Highest education level: Not on file  Occupational History  . Not on file  Tobacco Use  . Smoking status: Never Smoker  . Smokeless tobacco: Never Used  Substance and Sexual Activity  . Alcohol use: No  . Drug use: Not on file  . Sexual activity: Not on file  Other Topics Concern  . Not on file  Social History Narrative  . Not on file   Social Determinants of Health   Financial Resource Strain:   . Difficulty of Paying Living Expenses:   Food Insecurity:   . Worried About Charity fundraiser in the Last Year:   . Arboriculturist in the Last Year:   Transportation Needs:   . Film/video editor (Medical):   Marland Kitchen Lack of Transportation (Non-Medical):   Physical Activity:   . Days of Exercise per Week:   . Minutes of Exercise per Session:   Stress:   . Feeling of Stress :   Social Connections:   . Frequency of Communication with Friends and Family:   . Frequency of Social Gatherings with Friends and Family:   . Attends Religious Services:   . Active Member of Clubs or Organizations:   . Attends Archivist Meetings:   Marland Kitchen Marital Status:    No family history on file. Allergies  Allergen Reactions  . Sulfa Antibiotics Itching  . Penicillins Swelling and Rash    Swelling located at injection site    Prior to Admission medications   Medication Sig Start Date End Date Taking? Authorizing Provider  amLODipine (NORVASC) 5 MG tablet Take 5 mg by mouth daily.   Yes [provider]  beta carotene w/minerals (OCUVITE) tablet Take 1 tablet by mouth daily.   Yes [provider]  calcium carbonate (TUMS - DOSED IN MG ELEMENTAL CALCIUM) 500 MG chewable tablet Chew 1 tablet by mouth 2 (two) times daily.    Yes [provider]  cephALEXin (KEFLEX) 500 MG capsule Take 2 capsules (1,000 mg total) by mouth 2 (two) times daily. 09/02/15  Yes Charlesetta Shanks, MD  iron polysaccharides (NIFEREX) 150 MG capsule Take 150 mg by mouth 2 (two) times daily.    Yes [provider]  loperamide (IMODIUM A-D) 2 MG tablet Take 2 mg by mouth daily at 2 am.   Yes [provider]  loratadine (CLARITIN) 10 MG tablet Take 10 mg by mouth daily.   Yes [provider]  losartan (COZAAR) 100 MG tablet Take 100 mg by mouth daily.   Yes [provider]  losartan-hydrochlorothiazide (HYZAAR) 100-25 MG tablet Take 1 tablet by mouth daily.   Yes [provider]  Polyethyl Glycol-Propyl Glycol (SYSTANE OP)  Apply 1 drop to eye 2 (two) times daily.   Yes [provider]  Vitamin D, Ergocalciferol, (DRISDOL) 50000 UNITS CAPS capsule Take 50,000 Units by mouth every 7 (seven) days.   Yes [provider]     Positive ROS: Otherwise negative  All other systems have been reviewed and were otherwise negative with the exception of those mentioned in the HPI and as above.  Physical Exam: Constitutional: Alert, well-appearing, no acute distress Ears: External ears without lesions or tenderness. Ear canals reveal a large amount of dried wax in both ear canals.  This was removed using forceps as well some hydrogen peroxide to help soften the wax.  TMs were clear bilaterally.  She had a small attic pocket on the left side.. Nasal: External nose without  lesions. Clear nasal passages Oral: Oropharynx clear. Neck: No palpable adenopathy or masses Respiratory: Breathing comfortably  Skin: No facial/neck lesions or rash noted.  Cerumen impaction removal  Date/Time: 01/11/2020 5:09 PM Performed by: Rozetta Nunnery, MD Authorized by: Rozetta Nunnery, MD   Consent:    Consent obtained:  Verbal   Consent given by:  Patient   Risks discussed:  Pain and bleeding Procedure details:    Location:  L ear and R ear   Procedure type: curette, suction and forceps   Post-procedure details:    Inspection:  TM intact and canal normal   Hearing quality:  Improved   Patient tolerance of procedure:  Tolerated well, no immediate complications Comments:     TMs are clear bilaterally.    Assessment: Bilateral cerumen impactions.  Plan: She will follow-up as needed  Radene Journey, MD

## 2020-01-17 DIAGNOSIS — H401134 Primary open-angle glaucoma, bilateral, indeterminate stage: Secondary | ICD-10-CM | POA: Diagnosis not present

## 2020-01-30 DIAGNOSIS — M545 Low back pain: Secondary | ICD-10-CM | POA: Diagnosis not present

## 2020-01-30 DIAGNOSIS — Z0000 Encounter for general adult medical examination without abnormal findings: Secondary | ICD-10-CM | POA: Diagnosis not present

## 2020-01-30 DIAGNOSIS — R1310 Dysphagia, unspecified: Secondary | ICD-10-CM | POA: Diagnosis not present

## 2020-01-30 DIAGNOSIS — R197 Diarrhea, unspecified: Secondary | ICD-10-CM | POA: Diagnosis not present

## 2020-01-30 DIAGNOSIS — Z1331 Encounter for screening for depression: Secondary | ICD-10-CM | POA: Diagnosis not present

## 2020-01-30 DIAGNOSIS — R634 Abnormal weight loss: Secondary | ICD-10-CM | POA: Diagnosis not present

## 2020-01-30 DIAGNOSIS — R2681 Unsteadiness on feet: Secondary | ICD-10-CM | POA: Diagnosis not present

## 2020-01-30 DIAGNOSIS — I10 Essential (primary) hypertension: Secondary | ICD-10-CM | POA: Diagnosis not present

## 2020-01-30 DIAGNOSIS — R82998 Other abnormal findings in urine: Secondary | ICD-10-CM | POA: Diagnosis not present

## 2020-01-30 DIAGNOSIS — R296 Repeated falls: Secondary | ICD-10-CM | POA: Diagnosis not present

## 2020-01-30 DIAGNOSIS — M1990 Unspecified osteoarthritis, unspecified site: Secondary | ICD-10-CM | POA: Diagnosis not present

## 2020-02-08 ENCOUNTER — Other Ambulatory Visit: Payer: Self-pay | Admitting: Internal Medicine

## 2020-02-08 DIAGNOSIS — M810 Age-related osteoporosis without current pathological fracture: Secondary | ICD-10-CM

## 2020-02-09 ENCOUNTER — Other Ambulatory Visit: Payer: Self-pay | Admitting: Internal Medicine

## 2020-02-09 DIAGNOSIS — R634 Abnormal weight loss: Secondary | ICD-10-CM

## 2020-02-19 ENCOUNTER — Ambulatory Visit (INDEPENDENT_AMBULATORY_CARE_PROVIDER_SITE_OTHER): Payer: Medicare Other | Admitting: Ophthalmology

## 2020-02-19 ENCOUNTER — Other Ambulatory Visit: Payer: Self-pay

## 2020-02-19 ENCOUNTER — Encounter (INDEPENDENT_AMBULATORY_CARE_PROVIDER_SITE_OTHER): Payer: Self-pay | Admitting: Ophthalmology

## 2020-02-19 DIAGNOSIS — H353221 Exudative age-related macular degeneration, left eye, with active choroidal neovascularization: Secondary | ICD-10-CM

## 2020-02-19 DIAGNOSIS — H353211 Exudative age-related macular degeneration, right eye, with active choroidal neovascularization: Secondary | ICD-10-CM | POA: Diagnosis not present

## 2020-02-19 MED ORDER — RANIBIZUMAB 0.5 MG/0.05ML IZ SOLN FOR KALEIDOSCOPE
0.5000 mg | INTRAVITREAL | Status: AC | PRN
  Administered 2020-02-19: .5 mg via INTRAVITREAL

## 2020-02-19 NOTE — Progress Notes (Signed)
02/19/2020     CHIEF COMPLAINT Patient presents for Retina Follow Up   HISTORY OF PRESENT ILLNESS: Dawn Hampton is a 86 y.o. female who presents to the clinic today for:   HPI    Retina Follow Up    Patient presents with  Wet AMD.  In left eye.  This started 7 weeks ago.  Severity is moderate.  Duration of 7 weeks.  Since onset it is stable.          Comments    7 Week AMD F/U OS, poss Lucentis 0.5 OS  Pt c/o decreased near [place] OU, especially when reading fine print. Pt denies any other new symptoms OU.       Last edited by Rockie Neighbours, [place] on 02/19/2020 10:48 AM. (History)      Referring physician: Leanna Battles, MD [place],  [place] [address]  HISTORICAL INFORMATION:   Selected notes from the MEDICAL RECORD NUMBER       CURRENT MEDICATIONS: Current Outpatient Medications (Ophthalmic Drugs)  Medication Sig  . Polyethyl Glycol-Propyl Glycol (SYSTANE OP) Apply 1 drop to eye 2 (two) times daily.   No current facility-administered medications for this visit. (Ophthalmic Drugs)   Current Outpatient Medications (Other)  Medication Sig  . amLODipine (NORVASC) 5 MG tablet Take 5 mg by mouth daily.  . beta carotene w/minerals (OCUVITE) tablet Take 1 tablet by mouth daily.  . calcium carbonate (TUMS - DOSED IN MG ELEMENTAL CALCIUM) 500 MG chewable tablet Chew 1 tablet by mouth 2 (two) times daily.   . cephALEXin (KEFLEX) 500 MG capsule Take 2 capsules (1,000 mg total) by mouth 2 (two) times daily.  . iron polysaccharides (NIFEREX) 150 MG capsule Take 150 mg by mouth 2 (two) times daily.   Marland Kitchen loperamide (IMODIUM A-D) 2 MG tablet Take 2 mg by mouth daily at 2 am.  . loratadine (CLARITIN) 10 MG tablet Take 10 mg by mouth daily.  Marland Kitchen losartan (COZAAR) 100 MG tablet Take 100 mg by mouth daily.  Marland Kitchen losartan-hydrochlorothiazide (HYZAAR) 100-25 MG tablet Take 1 tablet by mouth daily.  . Vitamin D, Ergocalciferol, (DRISDOL) 50000 UNITS CAPS capsule Take 50,000  Units by mouth every 7 (seven) days.   No current facility-administered medications for this visit. (Other)      REVIEW OF SYSTEMS:    ALLERGIES Allergies  Allergen Reactions  . Sulfa Antibiotics Itching  . Penicillins Swelling and Rash    Swelling located at injection site    PAST MEDICAL HISTORY Past Medical History:  Diagnosis Date  . Abdominal mass, right lower quadrant   . Anemia   . Hypertension   . Macular degeneration   . Osteoporosis   . Shingles    T8   Past Surgical History:  Procedure Laterality Date  . cataracts  2004  . PUD, partial gastrectomy 1992    . TOTAL ABDOMINAL HYSTERECTOMY W/ BILATERAL SALPINGOOPHORECTOMY  1975    FAMILY HISTORY History reviewed. No pertinent family history.  SOCIAL HISTORY Social History   Tobacco Use  . Smoking status: Never Smoker  . Smokeless tobacco: Never Used  Substance Use Topics  . Alcohol use: No  . Drug use: Not on file         OPHTHALMIC EXAM:  Base Eye Exam    Visual Acuity (ETDRS)      Right Left   Dist cc 20/60 +2 20/40 +1   Dist ph cc NI NI   Correction: Glasses  Tonometry (Tonopen, 10:53 AM)      Right Left   Pressure 18 19       Pupils      Pupils Dark Light Shape React APD   Right PERRL 3 2 Round Minimal None   Left PERRL 3 2 Round Minimal None       Visual Fields (Counting fingers)      Left Right    Full Full       Extraocular Movement      Right Left    Full Full       Neuro/Psych    Oriented x3: Yes   Mood/Affect: Normal       Dilation    Left eye: 1.0% Mydriacyl, 2.5% Phenylephrine @ 10:53 AM        Slit Lamp and Fundus Exam    External Exam      Right Left   External Normal Normal       Slit Lamp Exam      Right Left   Lids/Lashes Normal Normal   Conjunctiva/Sclera White and quiet White and quiet   Cornea Clear Clear   Anterior Chamber Deep and quiet Deep and quiet   Iris Round and reactive Round and reactive   Lens Posterior chamber  intraocular lens Posterior chamber intraocular lens   Anterior Vitreous Normal Normal       Fundus Exam      Right Left   Posterior Vitreous  Posterior vitreous detachment   Disc  Normal   C/D Ratio  0.65   Macula  Geographic atrophy, Advanced age related macular degeneration, Hard drusen, no exudates, no hemorrhage, no macular thickening, Subretinal neovascular membrane, Retinal pigment epithelial mottling   Vessels  Normal   Periphery  Normal          IMAGING AND PROCEDURES  Imaging and Procedures for 02/19/20  OCT, Retina - OU - Both Eyes       Right Eye Quality was good. Scan locations included subfoveal. Central Foveal Thickness: 257. Progression has improved. Findings include abnormal foveal contour, subretinal scarring.   Left Eye Quality was good. Scan locations included subfoveal. Central Foveal Thickness: 254. Progression has improved. Findings include abnormal foveal contour, subretinal scarring, retinal drusen .        Intravitreal Injection, Pharmacologic Agent - OS - Left Eye       Time Out 02/19/2020. 11:24 AM. Confirmed correct patient, procedure, site, and patient consented.   Anesthesia Topical anesthesia was used. Anesthetic medications included [place] 3.5%.   Procedure Preparation included Tobramycin 0.3%, 10% betadine to eyelids. A 30 gauge needle was used.   Injection:  0.5 mg ranibizumab (LUCENTIS) 0.5 MG/0.05ML intravitreal injection   NDC: 50242-080-01, Lot: [id number]   Route: Intravitreal, Site: Left Eye, Waste: 0 mg  Post-op Post injection exam found visual acuity of at least counting fingers. The patient tolerated the procedure well. There were no complications. The patient received written and verbal post procedure care education. Post injection medications were not given.                 ASSESSMENT/PLAN:  Exudative age-related macular degeneration of left eye with active choroidal neovascularization (HCC) Stable examination by  clinical findings and OCT at 7-week interval.  Repeat intravitreal Lucentis today OS      ICD-10-CM   1. Exudative age-related macular degeneration of left eye with active choroidal neovascularization (HCC)  H35.3221 OCT, Retina - OU - Both Eyes    Intravitreal Injection, Pharmacologic Agent -  OS - Left Eye    ranibizumab (LUCENTIS) 0.5 MG/0.05ML intravitreal injection 0.5 mg  2. Exudative age-related macular degeneration of right eye with active choroidal neovascularization ([place])  H35.3211     1.  2.  3.  Ophthalmic Meds Ordered this visit:  Meds ordered this encounter  Medications  . ranibizumab (LUCENTIS) 0.5 MG/0.05ML intravitreal injection 0.5 mg       Return in about 7 weeks (around 04/08/2020) for dilate, OS, LUCENTIS 0.5 OCT.  There are no Patient Instructions on file for this visit.   Explained the diagnoses, plan, and follow up with the patient and they expressed understanding.  Patient expressed understanding of the importance of proper follow up care.   Clent Demark Anallely Rosell M.D. Diseases & Surgery of the Retina and Vitreous Retina & Diabetic [place] 02/19/20     Abbreviations: M myopia (nearsighted); A astigmatism; H hyperopia (farsighted); P presbyopia; Mrx spectacle prescription;  CTL contact lenses; OD right eye; OS left eye; OU both eyes  XT exotropia; ET esotropia; PEK punctate epithelial keratitis; PEE punctate epithelial erosions; DES dry eye syndrome; MGD meibomian gland dysfunction; ATs artificial tears; PFAT's preservative free artificial tears; [place] nuclear sclerotic cataract; PSC posterior subcapsular cataract; ERM epi-retinal membrane; PVD posterior vitreous detachment; RD retinal detachment; DM diabetes mellitus; DR diabetic retinopathy; NPDR non-proliferative diabetic retinopathy; PDR proliferative diabetic retinopathy; CSME clinically significant macular edema; DME diabetic macular edema; dbh dot blot hemorrhages; CWS cotton wool spot; POAG primary open  angle glaucoma; C/D cup-to-disc ratio; HVF humphrey visual field; GVF goldmann visual field; OCT optical coherence tomography; IOP intraocular pressure; BRVO Branch retinal vein occlusion; CRVO central retinal vein occlusion; CRAO central retinal artery occlusion; BRAO branch retinal artery occlusion; RT retinal tear; SB scleral buckle; PPV pars plana vitrectomy; VH Vitreous hemorrhage; PRP panretinal laser photocoagulation; IVK intravitreal kenalog; VMT vitreomacular traction; MH Macular hole;  NVD neovascularization of the disc; NVE neovascularization elsewhere; AREDS age related eye disease study; ARMD age related macular degeneration; POAG primary open angle glaucoma; EBMD epithelial/anterior basement membrane dystrophy; ACIOL anterior chamber intraocular lens; IOL intraocular lens; PCIOL posterior chamber intraocular lens; Phaco/IOL phacoemulsification with intraocular lens placement; [place] photorefractive keratectomy; LASIK laser assisted in situ keratomileusis; HTN hypertension; DM diabetes mellitus; COPD chronic obstructive pulmonary disease

## 2020-02-19 NOTE — Assessment & Plan Note (Signed)
Stable examination by clinical findings and OCT at 7-week interval.  Repeat intravitreal Lucentis today OS

## 2020-02-28 ENCOUNTER — Emergency Department (HOSPITAL_COMMUNITY): Payer: Medicare Other

## 2020-02-28 ENCOUNTER — Emergency Department (HOSPITAL_COMMUNITY)
Admission: EM | Admit: 2020-02-28 | Discharge: 2020-02-28 | Disposition: A | Discharge status: Home / Self Care | Payer: Medicare Other | Attending: Emergency Medicine | Admitting: Emergency Medicine

## 2020-02-28 ENCOUNTER — Encounter (HOSPITAL_COMMUNITY): Payer: Self-pay | Admitting: Emergency Medicine

## 2020-02-28 ENCOUNTER — Other Ambulatory Visit: Payer: Self-pay

## 2020-02-28 DIAGNOSIS — I10 Essential (primary) hypertension: Secondary | ICD-10-CM | POA: Insufficient documentation

## 2020-02-28 DIAGNOSIS — Z79899 Other long term (current) drug therapy: Secondary | ICD-10-CM | POA: Diagnosis not present

## 2020-02-28 DIAGNOSIS — I480 Paroxysmal atrial fibrillation: Secondary | ICD-10-CM | POA: Insufficient documentation

## 2020-02-28 DIAGNOSIS — N390 Urinary tract infection, site not specified: Secondary | ICD-10-CM | POA: Insufficient documentation

## 2020-02-28 DIAGNOSIS — R42 Dizziness and giddiness: Secondary | ICD-10-CM | POA: Diagnosis present

## 2020-02-28 LAB — URINALYSIS, ROUTINE W REFLEX MICROSCOPIC
Bilirubin Urine: NEGATIVE
Glucose, UA: NEGATIVE mg/dL
Hgb urine dipstick: NEGATIVE
Ketones, ur: 5 mg/dL — AB
Leukocytes,Ua: NEGATIVE
Nitrite: POSITIVE — AB
Protein, ur: 30 mg/dL — AB
Specific Gravity, Urine: 1.025 (ref 1.005–1.030)
pH: 5 (ref 5.0–8.0)

## 2020-02-28 LAB — BASIC METABOLIC PANEL
Anion gap: 13 (ref 5–15)
BUN: 23 mg/dL (ref 8–23)
CO2: 22 mmol/L (ref 22–32)
Calcium: 9 mg/dL (ref 8.9–10.3)
Chloride: 107 mmol/L (ref 98–111)
Creatinine, Ser: 0.78 mg/dL (ref 0.44–1.00)
GFR calc Af Amer: >60 mL/min (ref >60)
GFR calc non Af Amer: >60 mL/min (ref >60)
Glucose, Bld: 180 mg/dL — ABNORMAL HIGH (ref 70–99)
Potassium: 3.9 mmol/L (ref 3.5–5.1)
Sodium: 142 mmol/L (ref 135–145)

## 2020-02-28 LAB — CBC
HCT: 35.1 % — ABNORMAL LOW (ref 36.0–46.0)
HCT: 35.7 % — ABNORMAL LOW (ref 36.0–46.0)
Hemoglobin: 10.8 g/dL — ABNORMAL LOW (ref 12.0–15.0)
Hemoglobin: 10.9 g/dL — ABNORMAL LOW (ref 12.0–15.0)
MCH: 28.7 pg (ref 26.0–34.0)
MCH: 28.6 pg (ref 26.0–34.0)
MCHC: 30.8 g/dL (ref 30.0–36.0)
MCHC: 30.5 g/dL (ref 30.0–36.0)
MCV: 93.4 fL (ref 80.0–100.0)
MCV: 93.7 fL (ref 80.0–100.0)
Platelets: 180 10*3/uL (ref 150–400)
Platelets: 184 10*3/uL (ref 150–400)
RBC: 3.76 MIL/uL — ABNORMAL LOW (ref 3.87–5.11)
RBC: 3.81 MIL/uL — ABNORMAL LOW (ref 3.87–5.11)
RDW: 13.1 % (ref 11.5–15.5)
RDW: 13.2 % (ref 11.5–15.5)
WBC: 9.1 10*3/uL (ref 4.0–10.5)
WBC: 9 10*3/uL (ref 4.0–10.5)
nRBC: 0 % (ref 0.0–0.2)
nRBC: 0 % (ref 0.0–0.2)

## 2020-02-28 LAB — DIFFERENTIAL
Abs Immature Granulocytes: 0.02 10*3/uL (ref 0.00–0.07)
Basophils Absolute: 0.1 10*3/uL (ref 0.0–0.1)
Basophils Relative: 1 %
Eosinophils Absolute: 0 10*3/uL (ref 0.0–0.5)
Eosinophils Relative: 0 %
Immature Granulocytes: 0 %
Lymphocytes Relative: 12 %
Lymphs Abs: 1.1 10*3/uL (ref 0.7–4.0)
Monocytes Absolute: 0.4 10*3/uL (ref 0.1–1.0)
Monocytes Relative: 5 %
Neutro Abs: 7.4 10*3/uL (ref 1.7–7.7)
Neutrophils Relative %: 82 %

## 2020-02-28 LAB — HEPATIC FUNCTION PANEL
ALT: 26 U/L (ref 0–44)
AST: 44 U/L — ABNORMAL HIGH (ref 15–41)
Albumin: 3.5 g/dL (ref 3.5–5.0)
Alkaline Phosphatase: 61 U/L (ref 38–126)
Bilirubin, Direct: 0.1 mg/dL (ref 0.0–0.2)
Indirect Bilirubin: 0.5 mg/dL (ref 0.3–0.9)
Total Bilirubin: 0.6 mg/dL (ref 0.3–1.2)
Total Protein: 5.9 g/dL — ABNORMAL LOW (ref 6.5–8.1)

## 2020-02-28 MED ORDER — DILTIAZEM HCL-DEXTROSE 125-5 MG/125ML-% IV SOLN (PREMIX)
5.0000 mg/h | INTRAVENOUS | Status: DC
  Administered 2020-02-28: 5 mg/h via INTRAVENOUS
  Filled 2020-02-28: qty 125

## 2020-02-28 MED ORDER — DILTIAZEM HCL-DEXTROSE 125-5 MG/125ML-% IV SOLN (PREMIX): 5.0000 mg/h | INTRAVENOUS | Status: DC

## 2020-02-28 MED ORDER — SODIUM CHLORIDE 0.9% FLUSH: 3.0000 mL | Freq: Once | INTRAVENOUS | Status: DC

## 2020-02-28 MED ORDER — DILTIAZEM HCL ER 60 MG PO CP12
60.0000 mg | ORAL_CAPSULE | Freq: Two times a day (BID) | ORAL | 0 refills | Status: DC
Start: 2020-02-28 — End: 2020-02-28

## 2020-02-28 MED ORDER — DILTIAZEM LOAD VIA INFUSION: 15.0000 mg | Freq: Once | INTRAVENOUS | Status: DC

## 2020-02-28 MED ORDER — DILTIAZEM HCL ER COATED BEADS 120 MG PO CP24
120.0000 mg | ORAL_CAPSULE | Freq: Every day | ORAL | Status: DC
  Administered 2020-02-28: 120 mg via ORAL
  Filled 2020-02-28: qty 1

## 2020-02-28 MED ORDER — CEPHALEXIN 500 MG PO CAPS
500.0000 mg | ORAL_CAPSULE | Freq: Two times a day (BID) | ORAL | 0 refills | Status: AC
Start: 2020-02-28 — End: 2020-03-06

## 2020-02-28 MED ORDER — DILTIAZEM LOAD VIA INFUSION
10.0000 mg | Freq: Once | INTRAVENOUS | Status: AC
  Administered 2020-02-28: 10 mg via INTRAVENOUS
  Filled 2020-02-28: qty 10

## 2020-02-28 MED ORDER — DILTIAZEM HCL ER 60 MG PO CP12
60.0000 mg | ORAL_CAPSULE | Freq: Two times a day (BID) | ORAL | 2 refills | Status: DC
Start: 2020-02-28 — End: 2020-05-30

## 2020-02-28 NOTE — ED Notes (Signed)
Patient verbalizes understanding of discharge instructions. Opportunity for questioning and answers were provided. Armband removed by staff, pt discharged from ED.  

## 2020-02-28 NOTE — ED Notes (Signed)
Request phleb to contact lab for add on's

## 2020-02-28 NOTE — ED Triage Notes (Signed)
Pt. Stated, Dawn Hampton been shakey and almos pass out. Started about a hour ago.

## 2020-02-28 NOTE — ED Notes (Signed)
Pt began to convert to SR

## 2020-02-28 NOTE — ED Provider Notes (Signed)
[facility] Provider Note   CSN: [id number] Arrival date & time: 02/28/20  1150     History Chief Complaint  Patient presents with  . Near Syncope  . Chills  . Palpitations    Dawn Hampton is a 86 y.o. female.  HPI      86 year old female with history of hypertension presents with concern for generalized weakness and dizziness.  Reports she has had some lightheadedness for the past few days, but today she acutely became worse when her neighbor was over.  Reports she suddenly felt very shaky, and lightheaded like she was about to pass out.  Started about an hour prior to arrival.  Describes a mild sensation of chest tightness.  Denies feeling palpitations.  Denies shortness of breath.  Denies any recent illness including no nausea, vomiting, diarrhea, black or bloody stools.  Reports she is being evaluated for weight loss as an outpatient.  Past Medical History:  Diagnosis Date  . Abdominal mass, right lower quadrant   . Anemia   . Hypertension   . Macular degeneration   . Osteoporosis   . Shingles    T8    Patient Active Problem List   Diagnosis Date Noted  . Exudative age-related macular degeneration of right eye with active choroidal neovascularization ([place]) 12/11/2019  . 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 Advanced nonexudative age-related macular degeneration of right eye with subfoveal involvement 12/11/2019  . Exudative age-related macular degeneration of left eye with active choroidal neovascularization ([place]) 12/11/2019  . Cystoid macular edema of left eye 12/11/2019  . Posterior vitreous detachment of left eye 12/11/2019    Past Surgical History:  Procedure Laterality Date  . cataracts  2004  . PUD, partial gastrectomy 1992    . TOTAL ABDOMINAL HYSTERECTOMY W/ BILATERAL SALPINGOOPHORECTOMY  1975     OB History   No obstetric history on  file.     No family history on file.  Social History   Tobacco Use  . Smoking status: Never Smoker  . Smokeless tobacco: Never Used  Substance Use Topics  . Alcohol use: No  . Drug use: Not on file    Home Medications Prior to Admission medications   Medication Sig Start Date End Date Taking? Authorizing Provider  amLODipine (NORVASC) 5 MG tablet Take 5 mg by mouth daily.    [provider]  beta carotene w/minerals (OCUVITE) tablet Take 1 tablet by mouth daily.    [provider]  calcium carbonate (TUMS - DOSED IN MG ELEMENTAL CALCIUM) 500 MG chewable tablet Chew 1 tablet by mouth 2 (two) times daily.     [provider]  cephALEXin (KEFLEX) 500 MG capsule Take 1 capsule (500 mg total) by mouth 2 (two) times daily for 7 days. 02/28/20 03/06/20  Gareth Morgan, MD  diltiazem (CARDIZEM SR) 60 MG 12 hr capsule Take 1 capsule (60 mg total) by mouth 2 (two) times daily. 02/28/20 05/28/20  Gareth Morgan, MD  iron polysaccharides (NIFEREX) 150 MG capsule Take 150 mg by mouth 2 (two) times daily.     [provider]  loperamide (IMODIUM A-D) 2 MG tablet Take 2 mg by mouth daily at 2 am.    [provider]  loratadine (CLARITIN) 10 MG tablet Take 10 mg by mouth daily.    [provider]  losartan (COZAAR) 100 MG tablet Take 100 mg by mouth daily.    [provider]  losartan-hydrochlorothiazide (HYZAAR) 100-25 MG tablet Take 1 tablet by mouth daily.    [provider]  Polyethyl Glycol-Propyl Glycol (SYSTANE OP) Apply 1 drop to eye 2 (two) times daily.    [provider]  Vitamin D, Ergocalciferol, (DRISDOL) 50000 UNITS CAPS capsule Take 50,000 Units by mouth every 7 (seven) days.    [provider]    Allergies    Sulfa antibiotics and Penicillins  Review of Systems   Review of Systems  Constitutional: Positive for fatigue and unexpected weight change.  HENT: Negative for congestion.     Respiratory: Positive for chest tightness. Negative for cough and shortness of breath.   Cardiovascular: Negative for chest pain.  Gastrointestinal: Negative for abdominal pain, blood in stool, diarrhea, nausea and vomiting.  Genitourinary: Negative for dysuria.  Musculoskeletal: Negative for back pain.  Skin: Negative for rash.  Neurological: Positive for light-headedness.    Physical Exam Updated Vital Signs BP (!) 147/48   Pulse 65   Temp 97.7 F (36.5 C) (Oral)   Resp 20   Ht 4\' 11"  (1.499 m)   Wt 42.2 kg   SpO2 98%   BMI 18.78 kg/m   Physical Exam Constitutional:      General: She is not in acute distress.    Appearance: Normal appearance. She is not ill-appearing or diaphoretic.  HENT:     Head: Normocephalic and atraumatic.  Eyes:     Extraocular Movements: Extraocular movements intact.     Conjunctiva/sclera: Conjunctivae normal.  Neck:     Vascular: No JVD.  Cardiovascular:     Rate and Rhythm: Tachycardia present. Rhythm irregular.     Pulses: Normal pulses.     Heart sounds: Normal heart sounds. No murmur heard.  No friction rub. No gallop.   Pulmonary:     Effort: Pulmonary effort is normal. No respiratory distress.     Breath sounds: Normal breath sounds. No wheezing, rhonchi or rales.  Chest:     Chest wall: No tenderness.  Abdominal:     General: Abdomen is flat. There is no distension.     Palpations: Abdomen is soft.  Musculoskeletal:     Right lower leg: No edema.     Left lower leg: No edema.  Skin:    General: Skin is warm and dry.     Findings: No erythema.  Neurological:     Mental Status: She is alert and oriented to person, place, and time.     ED Results / Procedures / Treatments   Labs (all labs ordered are listed, but only abnormal results are displayed) Labs Reviewed  BASIC METABOLIC PANEL - Abnormal; Notable for the following components:      Result Value   Glucose, Bld 180 (*)    All other components within normal limits   CBC - Abnormal; Notable for the following components:   RBC 3.76 (*)    Hemoglobin 10.8 (*)    HCT 35.1 (*)    All other components within normal limits  URINALYSIS, ROUTINE W REFLEX MICROSCOPIC - Abnormal; Notable for the following components:   APPearance HAZY (*)    Ketones, ur 5 (*)    Protein, ur 30 (*)    Nitrite POSITIVE (*)    Bacteria, UA MANY (*)    All other components within normal limits  HEPATIC FUNCTION PANEL - Abnormal; Notable for the following components:   Total Protein 5.9 (*)    AST 44 (*)    All  other components within normal limits  CBC - Abnormal; Notable for the following components:   RBC 3.81 (*)    Hemoglobin 10.9 (*)    HCT 35.7 (*)    All other components within normal limits  URINE CULTURE  DIFFERENTIAL    EKG EKG Interpretation  Date/Time:  Wednesday February 28 2020 13:43:16 EDT Ventricular Rate:  75 PR Interval:    QRS Duration: 140 QT Interval:  418 QTC Calculation: 467 R Axis:   -25 Text Interpretation: Sinus rhythm Left bundle branch block Since prior ECG, rate has decreased and rhythm is now sinus Confirmed by Gareth Morgan [phone]) on 02/28/2020 2:46:28 PM   Radiology DG Chest Portable 1 View  Result Date: 02/28/2020 CLINICAL DATA:  Tachycardia. EXAM: PORTABLE CHEST 1 VIEW COMPARISON:  Prior chest radiographs 01/08/2017 and earlier FINDINGS: Shallow inspiration radiograph, limiting evaluation of heart size. Question mild cardiomegaly. Aortic atherosclerosis. Chronic elevation of the right hemidiaphragm with mild right basilar atelectasis. No appreciable airspace consolidation or frank pulmonary edema. No evidence of pleural effusion or pneumothorax. No acute bony abnormality identified. Cardiac monitoring leads project over the central and left chest. IMPRESSION: Shallow inspiration radiograph. Chronic elevation of the right hemidiaphragm with mild right basilar atelectasis. No appreciable airspace consolidation or frank pulmonary edema.  Question mild cardiomegaly. Aortic Atherosclerosis (ICD10-I70.0). Electronically Signed   By: Kellie Simmering DO   On: 02/28/2020 12:55    Procedures .Critical Care Performed by: Gareth Morgan, MD Authorized by: Gareth Morgan, MD   Critical care provider statement:    Critical care time (minutes):  30   Critical care was time spent personally by me on the following activities:  Discussions with consultants, evaluation of patient's response to treatment, examination of patient, ordering and performing treatments and interventions, ordering and review of laboratory studies, ordering and review of radiographic studies, pulse oximetry, re-evaluation of patient's condition, obtaining history from patient or surrogate and review of old charts   (including critical care time)  Medications Ordered in ED Medications  sodium chloride flush (NS) 0.9 % injection 3 mL (has no administration in time range)  diltiazem (CARDIZEM) 1 mg/mL load via infusion 10 mg (10 mg Intravenous Bolus from Bag 02/28/20 1328)    And  diltiazem (CARDIZEM) 125 mg in dextrose 5% 125 mL (1 mg/mL) infusion (0 mg/hr Intravenous Stopped 02/28/20 1532)  diltiazem (CARDIZEM CD) 24 hr capsule 120 mg (120 mg Oral Given 02/28/20 1533)    ED Course  I have reviewed the triage vital signs and the nursing notes.  Pertinent labs & imaging results that were available during my care of the patient were reviewed by me and considered in my medical decision making (see chart for details).    MDM Rules/Calculators/A&P                          86 year old female with history of hypertension presents with concern for generalized weakness and dizziness.  On arrival to the ED she has a heart rate up to 200, rhythm irregular and post consistent with atrial fibrillation with RVR with underlying LBBB (2011 ECG showing LBB) and this was also discussed with Cardiology on call.  Discussed options of treatment with patient including  cardioversion versus medical treatment. Given very mild chest tightness, current BP, and dizziness for a few days prior to acute worsening an hour prior to arrival agree to avoid cardioversion and give medications for rate control.   Given diltiazem with return to  sinus rhythm.  Transitioned to oral diltiazem and gtt discontinued. Observed in the ED with no return of atrial fibrillation and asymptomatic. Reports no chest tightness or symptoms. Able to ambulate independently.    Suspect symptoms secondary to atrial fibrillation with RVR.  CHADSVASc score 4, however discussed with patient and Cardiology Dr. Irish Lack and given single episode of atrial fibrillation which appeared to have been brief by symptoms and quickly resolved in a patient that lives alone we decided to forgo anticoagulation at this time until she follows up with Cardiology and continues the discussion.  Will initiate diltiazem rx.   Discussed option of coming into hospital for observation or going home with patient. She prefers to go home and given she is at her baseline, ambulating independently, asymptomatic in sinus rhythm feel this is appropriate with very strict return precautions. She agrees and understands reasons to return. Patient discharged in stable condition with understanding of reasons to return.   UA with nitrites, bacteria, 0-5 WBC. Possible contaminant but will treat for UTI and send for culture.   Final Clinical Impression(s) / ED Diagnoses Final diagnoses:  Paroxysmal atrial fibrillation (HCC)  Urinary tract infection without hematuria, site unspecified    Rx / DC Orders ED Discharge Orders         Ordered    diltiazem (CARDIZEM SR) 60 MG 12 hr capsule  2 times daily     Discontinue  Reprint     02/28/20 1736    cephALEXin (KEFLEX) 500 MG capsule  2 times daily     Discontinue  Reprint     02/28/20 1744           Gareth Morgan, MD 02/28/20 1757

## 2020-02-28 NOTE — ED Notes (Signed)
Contacted pharm to ask for cardizem drip

## 2020-02-28 NOTE — ED Notes (Signed)
Xray remains at bedside.

## 2020-02-28 NOTE — ED Notes (Signed)
EDP remains at bedside with pt.

## 2020-02-28 NOTE — ED Notes (Signed)
Cardizem d/c per VO from EDP

## 2020-02-29 ENCOUNTER — Other Ambulatory Visit: Payer: Self-pay

## 2020-02-29 ENCOUNTER — Encounter (HOSPITAL_COMMUNITY): Payer: Self-pay | Admitting: Physician Assistant

## 2020-02-29 ENCOUNTER — Ambulatory Visit (HOSPITAL_COMMUNITY)
Admission: RE | Admit: 2020-02-29 | Discharge: 2020-02-29 | Disposition: A | Discharge status: Home / Self Care | Payer: Medicare Other | Source: Ambulatory Visit | Attending: Physician Assistant | Admitting: Physician Assistant

## 2020-02-29 VITALS — BP 110/50 | HR 64 | Ht 59.0 in | Wt 96.6 lb

## 2020-02-29 DIAGNOSIS — Z87440 Personal history of urinary (tract) infections: Secondary | ICD-10-CM | POA: Diagnosis not present

## 2020-02-29 DIAGNOSIS — Z79899 Other long term (current) drug therapy: Secondary | ICD-10-CM | POA: Diagnosis not present

## 2020-02-29 DIAGNOSIS — Z7901 Long term (current) use of anticoagulants: Secondary | ICD-10-CM | POA: Insufficient documentation

## 2020-02-29 DIAGNOSIS — Z8619 Personal history of other infectious and parasitic diseases: Secondary | ICD-10-CM | POA: Insufficient documentation

## 2020-02-29 DIAGNOSIS — H3530 Unspecified macular degeneration: Secondary | ICD-10-CM | POA: Diagnosis not present

## 2020-02-29 DIAGNOSIS — I480 Paroxysmal atrial fibrillation: Secondary | ICD-10-CM | POA: Insufficient documentation

## 2020-02-29 DIAGNOSIS — I10 Essential (primary) hypertension: Secondary | ICD-10-CM | POA: Diagnosis not present

## 2020-02-29 DIAGNOSIS — Z8711 Personal history of peptic ulcer disease: Secondary | ICD-10-CM | POA: Insufficient documentation

## 2020-02-29 DIAGNOSIS — D6869 Other thrombophilia: Secondary | ICD-10-CM | POA: Diagnosis not present

## 2020-02-29 MED ORDER — APIXABAN 2.5 MG PO TABS: 2.5000 mg | ORAL_TABLET | Freq: Two times a day (BID) | ORAL | 0 refills | Status: DC

## 2020-02-29 NOTE — Progress Notes (Signed)
Primary Care Physician: Leanna Battles, MD Primary Cardiologist: none Primary Electrophysiologist: none Referring Physician: [facility]   Dawn Hampton is a 86 y.o. female with a history of HTN and new onset atrial fibrillation who presents for consultation in the [facility]. The patient was initially diagnosed with atrial fibrillation on 02/28/20 after presenting to the ED with symptoms of palpitations, lightheadedness, and a "shaky" feeling. ECG showed rapid afib HR 190s. She was given diltiazem and converted in the ED. Patient is not on anticoagulation for a CHADS2VASC score of 4. She was also diagnosed with a UTI in the ED and started on antibiotics. She was discharged on diltiazem. She denies significant snoring or alcohol use. She denies ever having palpitations before.    Today, she denies symptoms of palpitations, chest pain, shortness of breath, orthopnea, PND, lower extremity edema, dizziness, presyncope, syncope, snoring, daytime somnolence, bleeding, or neurologic sequela. The patient is tolerating medications without difficulties and is otherwise without complaint today.    Atrial Fibrillation Risk Factors:  she does not have symptoms or diagnosis of sleep apnea. she does not have a history of rheumatic fever. she does not have a history of alcohol use. The patient does not have a history of early familial atrial fibrillation or other arrhythmias.  she has a BMI of Body mass index is 19.51 kg/m.Marland Kitchen Filed Weights   02/29/20 1433  Weight: 43.8 kg    No family history on file.   Atrial Fibrillation Management history:  Previous antiarrhythmic drugs: none Previous cardioversions: none Previous ablations: none CHADS2VASC score: 4 Anticoagulation history: none   Past Medical History:  Diagnosis Date  . Abdominal mass, right lower quadrant   . Anemia   . Hypertension   . Macular degeneration   . Osteoporosis   . Shingles     T8   Past Surgical History:  Procedure Laterality Date  . cataracts  2004  . PUD, partial gastrectomy 1992    . TOTAL ABDOMINAL HYSTERECTOMY W/ BILATERAL SALPINGOOPHORECTOMY  1975    Current Outpatient Medications  Medication Sig Dispense Refill  . amLODipine (NORVASC) 5 MG tablet Take 5 mg by mouth daily.    . beta carotene w/minerals (OCUVITE) tablet Take 1 tablet by mouth daily.    . calcium carbonate (TUMS - DOSED IN MG ELEMENTAL CALCIUM) 500 MG chewable tablet Chew 1 tablet by mouth 2 (two) times daily.     . cephALEXin (KEFLEX) 500 MG capsule Take 1 capsule (500 mg total) by mouth 2 (two) times daily for 7 days. 14 capsule 0  . diltiazem (CARDIZEM SR) 60 MG 12 hr capsule Take 1 capsule (60 mg total) by mouth 2 (two) times daily. 60 capsule 2  . iron polysaccharides (NIFEREX) 150 MG capsule Take 150 mg by mouth 2 (two) times daily.     Marland Kitchen latanoprost (XALATAN) 0.005 % ophthalmic solution 1 drop at bedtime.    Marland Kitchen loperamide (IMODIUM A-D) 2 MG tablet Take 2 mg by mouth daily at 2 am.    . loratadine (CLARITIN) 10 MG tablet Take 10 mg by mouth daily.    Marland Kitchen losartan (COZAAR) 100 MG tablet Take 100 mg by mouth daily.    Vladimir Faster Glycol-Propyl Glycol (SYSTANE OP) Apply 1 drop to eye 2 (two) times daily.    . Probiotic Product (PROBIOTIC ACIDOPHILUS BEADS) CAPS Take by mouth.    . Vitamin D, Ergocalciferol, (DRISDOL) 50000 UNITS CAPS capsule Take 50,000 Units by mouth every 7 (  seven) days.    Marland Kitchen apixaban (ELIQUIS) 2.5 MG TABS tablet Take 1 tablet (2.5 mg total) by mouth 2 (two) times daily. 60 tablet 0   No current facility-administered medications for this encounter.    Allergies  Allergen Reactions  . Bacitracin-Polymyxin B Anaphylaxis  . Sulfa Antibiotics Itching  . Penicillins Swelling and Rash    Swelling located at injection site    Social History   Socioeconomic History  . Marital status: Widowed    Spouse name: Not on file  . Number of children: Not on file  . Years of  education: Not on file  . Highest education level: Not on file  Occupational History  . Not on file  Tobacco Use  . Smoking status: Never Smoker  . Smokeless tobacco: Never Used  Substance and Sexual Activity  . Alcohol use: No  . Drug use: Not on file  . Sexual activity: Not on file  Other Topics Concern  . Not on file  Social History Narrative  . Not on file   Social Determinants of Health   Financial Resource Strain:   . Difficulty of Paying Living Expenses:   Food Insecurity:   . Worried About Charity fundraiser in the Last Year:   . Arboriculturist in the Last Year:   Transportation Needs:   . Film/video editor (Medical):   Marland Kitchen Lack of Transportation (Non-Medical):   Physical Activity:   . Days of Exercise per Week:   . Minutes of Exercise per Session:   Stress:   . Feeling of Stress :   Social Connections:   . Frequency of Communication with Friends and Family:   . Frequency of Social Gatherings with Friends and Family:   . Attends Religious Services:   . Active Member of Clubs or Organizations:   . Attends Archivist Meetings:   Marland Kitchen Marital Status:   Intimate Partner Violence:   . Fear of Current or Ex-Partner:   . Emotionally Abused:   Marland Kitchen Physically Abused:   . Sexually Abused:      ROS- All systems are reviewed and negative except as per the HPI above.  Physical Exam: Vitals:   02/29/20 1433  BP: (!) 110/50  Pulse: 64  Weight: 43.8 kg  Height: 4\' 11"  (1.499 m)    GEN- The patient is well appearing elderly female, alert and oriented x 3 today.   Head- normocephalic, atraumatic Eyes-  Sclera clear, conjunctiva pink Ears- hearing intact Oropharynx- clear Neck- supple  Lungs- Clear to ausculation bilaterally, normal work of breathing Heart- Regular rate and rhythm, no murmurs, rubs or gallops  GI- soft, NT, ND, + BS Extremities- no clubbing, cyanosis, or edema MS- no significant deformity or atrophy Skin- no rash or lesion Psych-  euthymic mood, full affect Neuro- strength and sensation are intact  Wt Readings from Last 3 Encounters:  02/29/20 43.8 kg  02/28/20 42.2 kg  04/06/19 44.8 kg    EKG today demonstrates SR HR 64, LBBB, PR 144, QRS 140, QTc 489  Epic records are reviewed at length today  CHA2DS2-VASc Score = 4  The patient's score is based upon: CHF History: 0 HTN History: 1 Age : 2 Diabetes History: 0 Stroke History: 0 Vascular Disease History: 0 Gender: 1      ASSESSMENT AND PLAN: 1. Paroxysmal Atrial Fibrillation (ICD10:  I48.0) The patient's CHA2DS2-VASc score is 4, indicating a 4.8% annual risk of stroke.   ? If 2/2 UTI, culture  pending. General education about afib provided and questions answered. We also discussed her stroke risk and the risks and benefits of anticoagulation.  Check echocardiogram Continue diltiazem 60 mg BID Start Eliquis 2.5 mg BID (age, weight)  2. Secondary Hypercoagulable State (ICD10:  D68.69) The patient is at significant risk for stroke/thromboembolism based upon her CHA2DS2-VASc Score of 4.  Start Apixaban (Eliquis).   3. HTN Stable, no changes today.   Follow up in the AF clinic in one month.    [facility] [address] [place], [place] [address] [phone] 02/29/2020 4:15 PM

## 2020-02-29 NOTE — Patient Instructions (Signed)
Start Eliquis 2.5 mg twice a day

## 2020-03-01 ENCOUNTER — Ambulatory Visit (HOSPITAL_COMMUNITY): Payer: Medicare Other | Admitting: Physician Assistant

## 2020-03-01 ENCOUNTER — Other Ambulatory Visit (HOSPITAL_COMMUNITY): Payer: Self-pay | Admitting: *Deleted

## 2020-03-01 LAB — URINE CULTURE: Culture: >=100000 — AB

## 2020-03-01 MED ORDER — APIXABAN 2.5 MG PO TABS: 2.5000 mg | ORAL_TABLET | Freq: Two times a day (BID) | ORAL | 2 refills | Status: DC

## 2020-03-05 ENCOUNTER — Ambulatory Visit
Admission: RE | Admit: 2020-03-05 | Discharge: 2020-03-05 | Disposition: A | Discharge status: Home / Self Care | Payer: Medicare Other | Source: Ambulatory Visit | Attending: Internal Medicine | Admitting: Internal Medicine

## 2020-03-05 DIAGNOSIS — R634 Abnormal weight loss: Secondary | ICD-10-CM

## 2020-03-05 MED ORDER — IOPAMIDOL (ISOVUE-300) INJECTION 61%
80.0000 mL | Freq: Once | INTRAVENOUS | Status: AC | PRN
  Administered 2020-03-05: 80 mL via INTRAVENOUS

## 2020-03-11 ENCOUNTER — Encounter (INDEPENDENT_AMBULATORY_CARE_PROVIDER_SITE_OTHER): Payer: Self-pay | Admitting: Ophthalmology

## 2020-03-11 ENCOUNTER — Other Ambulatory Visit: Payer: Self-pay

## 2020-03-11 ENCOUNTER — Ambulatory Visit (INDEPENDENT_AMBULATORY_CARE_PROVIDER_SITE_OTHER): Payer: Medicare Other | Admitting: Ophthalmology

## 2020-03-11 DIAGNOSIS — H353114 Nonexudative age-related macular degeneration, right eye, advanced atrophic with subfoveal involvement: Secondary | ICD-10-CM

## 2020-03-11 DIAGNOSIS — H353211 Exudative age-related macular degeneration, right eye, with active choroidal neovascularization: Secondary | ICD-10-CM

## 2020-03-11 MED ORDER — RANIBIZUMAB 0.5 MG/0.05ML IZ SOLN FOR KALEIDOSCOPE
0.5000 mg | INTRAVITREAL | Status: AC | PRN
  Administered 2020-03-11: .5 mg via INTRAVITREAL

## 2020-03-11 NOTE — Progress Notes (Signed)
03/11/2020     CHIEF COMPLAINT Patient presents for Retina Follow Up   HISTORY OF PRESENT ILLNESS: Dawn Hampton [name] is a 86 y.o. female who presents to the clinic today for:   HPI    Retina Follow Up    Patient presents with  Wet AMD.  In right eye.  This started 3 months ago.  Duration of 3 months.  Since onset it is stable.          Comments    3 month f/u with possible Luncentis OD and OCT mac today.  Pt denies noticeable changes to [place] OU since last visit. Pt denies ocular pain, flashes of light, or floaters OU.         Last edited by Melburn Popper, COA on 03/11/2020 10:44 AM. (History)      Referring physician: Leanna Battles, MD [place],  [place] [address]  HISTORICAL INFORMATION:   Selected notes from the MEDICAL RECORD NUMBER       CURRENT MEDICATIONS: Current Outpatient Medications (Ophthalmic Drugs)  Medication Sig  . latanoprost (XALATAN) 0.005 % ophthalmic solution 1 drop at bedtime.  Vladimir Faster Glycol-Propyl Glycol (SYSTANE OP) Apply 1 drop to eye 2 (two) times daily.   No current facility-administered medications for this visit. (Ophthalmic Drugs)   Current Outpatient Medications (Other)  Medication Sig  . amLODipine (NORVASC) 5 MG tablet Take 5 mg by mouth daily.  Marland Kitchen apixaban (ELIQUIS) 2.5 MG TABS tablet Take 1 tablet (2.5 mg total) by mouth 2 (two) times daily.  . beta carotene w/minerals (OCUVITE) tablet Take 1 tablet by mouth daily.  . calcium carbonate (TUMS - DOSED IN MG ELEMENTAL CALCIUM) 500 MG chewable tablet Chew 1 tablet by mouth 2 (two) times daily.   Marland Kitchen diltiazem (CARDIZEM SR) 60 MG 12 hr capsule Take 1 capsule (60 mg total) by mouth 2 (two) times daily.  . iron polysaccharides (NIFEREX) 150 MG capsule Take 150 mg by mouth 2 (two) times daily.   Marland Kitchen loperamide (IMODIUM A-D) 2 MG tablet Take 2 mg by mouth daily at 2 am.  . loratadine (CLARITIN) 10 MG tablet Take 10 mg by mouth daily.  Marland Kitchen losartan (COZAAR) 100 MG tablet  Take 100 mg by mouth daily.  . Probiotic Product (PROBIOTIC ACIDOPHILUS BEADS) CAPS Take by mouth.  . Vitamin D, Ergocalciferol, (DRISDOL) 50000 UNITS CAPS capsule Take 50,000 Units by mouth every 7 (seven) days.   No current facility-administered medications for this visit. (Other)      REVIEW OF SYSTEMS:    ALLERGIES Allergies  Allergen Reactions  . Bacitracin-Polymyxin B Anaphylaxis  . Sulfa Antibiotics Itching  . Penicillins Swelling and Rash    Swelling located at injection site    PAST MEDICAL HISTORY Past Medical History:  Diagnosis Date  . Abdominal mass, right lower quadrant   . Anemia   . Hypertension   . Macular degeneration   . Osteoporosis   . Shingles    T8   Past Surgical History:  Procedure Laterality Date  . cataracts  2004  . PUD, partial gastrectomy 1992    . TOTAL ABDOMINAL HYSTERECTOMY W/ BILATERAL SALPINGOOPHORECTOMY  1975    FAMILY HISTORY History reviewed. No pertinent family history.  SOCIAL HISTORY Social History   Tobacco Use  . Smoking status: Never Smoker  . Smokeless tobacco: Never Used  Substance Use Topics  . Alcohol use: No  . Drug use: Not on file  OPHTHALMIC EXAM:  Base Eye Exam    Visual Acuity (ETDRS)      Right Left   Dist cc 20/80 -2 20/50 -1   Dist ph cc NI NI   Correction: Glasses       Tonometry (Tonopen, 10:50 AM)      Right Left   Pressure 15 18       Pupils      Pupils Dark Light Shape React APD   Right PERRL 3 2 Round Slow None   Left PERRL 3 2 Round Slow None       Visual Fields (Counting fingers)      Left Right    Full        Extraocular Movement      Right Left    Full Full       Neuro/Psych    Oriented x3: Yes   Mood/Affect: Normal       Dilation    Right eye: 1.0% Mydriacyl, 2.5% Phenylephrine @ 10:51 AM        Slit Lamp and Fundus Exam    External Exam      Right Left   External Normal Normal       Slit Lamp Exam      Right Left   Lids/Lashes Normal  Normal   Conjunctiva/Sclera White and quiet White and quiet   Cornea Clear Clear   Anterior Chamber Deep and quiet Deep and quiet   Iris Round and reactive Round and reactive   Lens Posterior chamber intraocular lens Posterior chamber intraocular lens   Anterior Vitreous Normal Normal       Fundus Exam      Right Left   Posterior Vitreous Normal    Disc Normal    C/D Ratio 0.7    Macula Geographic atrophy, surrounds a small island of foveal pigment    Vessels Normal    Periphery Normal           IMAGING AND PROCEDURES  Imaging and Procedures for 03/11/20  OCT, Retina - OU - Both Eyes       Right Eye Quality was good. Scan locations included subfoveal. Central Foveal Thickness: 273. Progression has no prior data. Findings include abnormal foveal contour, outer retinal atrophy, central retinal atrophy, subretinal scarring.   Left Eye Scan locations included subfoveal. Central Foveal Thickness: 252. Progression has no prior data. Findings include abnormal foveal contour, no SRF, no IRF.   Notes Outer retinal atrophy with loss of the I-S-OS segments perifoveal OD.  Currently at 22-monthinterval.  No perifoveal CME or intraretinal fluid.  We will repeat intravitreal Lucentis today and examined OD hereafter  OS, stable, repeat examination is scheduled       Intravitreal Injection, Pharmacologic Agent - OD - Right Eye       Time Out 03/11/2020. 11:38 AM. Confirmed correct patient, procedure, site, and patient consented.   Anesthesia Topical anesthesia was used. Anesthetic medications included Akten 3.5%.   Procedure Preparation included Tobramycin 0.3%, 10% betadine to eyelids, 5% betadine to ocular surface. A 30 gauge needle was used.   Injection:  0.5 mg ranibizumab (LUCENTIS) 0.5 MG/0.05ML intravitreal injection   NDC: 50242-080-01, Lot: [id number]  Route: Intravitreal, Site: Right Eye, Waste: 0 mg  Post-op Post injection exam found visual acuity of at least  counting fingers. The patient tolerated the procedure well. There were no complications. The patient received written and verbal post procedure care education.  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  Less perifoveal CME.,  Will repeat Lucentis OD today and plan for examination alone OD here after      ICD-10-CM   1. Exudative age-related macular degeneration of right eye with active choroidal neovascularization (HCC)  H35.3211 OCT, Retina - OU - Both Eyes    Intravitreal Injection, Pharmacologic Agent - OD - Right Eye    ranibizumab (LUCENTIS) 0.5 MG/0.05ML intravitreal injection 0.5 mg  2. Advanced nonexudative age-related macular degeneration of right eye with subfoveal involvement  H35.3114     1.  Repeat intravitreal Lucentis OD today, at 71-monthinterval follow-up in 3 months and exam only OD  2.  Next visit OS as scheduled  3.  Ophthalmic Meds Ordered this visit:  Meds ordered this encounter  Medications  . ranibizumab (LUCENTIS) 0.5 MG/0.05ML intravitreal injection 0.5 mg       Return in about 3 months (around 06/11/2020) for dilate, OD, OCT.  There are no Patient Instructions on file for this visit.   Explained the diagnoses, plan, and follow up with the patient and they expressed understanding.  Patient expressed understanding of the importance of proper follow up care.   GClent DemarkRankin M.D. Diseases & Surgery of the Retina and Vitreous Retina & Diabetic EPit[place]07/12/21     Abbreviations: M myopia (nearsighted); A astigmatism; H hyperopia (farsighted); P presbyopia; Mrx spectacle prescription;  CTL contact lenses; OD right eye; OS left eye; OU both eyes  XT exotropia; ET esotropia; PEK punctate epithelial keratitis; PEE punctate epithelial erosions; DES dry eye  syndrome; MGD meibomian gland dysfunction; ATs artificial tears; PFAT's preservative free artificial tears; [place] sclerotic cataract; PSC posterior subcapsular cataract; ERM epi-retinal membrane; PVD posterior vitreous detachment; RD retinal detachment; DM diabetes mellitus; DR diabetic retinopathy; NPDR non-proliferative diabetic retinopathy; PDR proliferative diabetic retinopathy; CSME clinically significant macular edema; DME diabetic macular edema; dbh dot blot hemorrhages; CWS cotton wool spot; POAG primary open angle glaucoma; C/D cup-to-disc ratio; HVF humphrey visual field; GVF goldmann visual field; OCT optical coherence tomography; IOP intraocular pressure; BRVO Branch retinal vein occlusion; CRVO central retinal vein occlusion; CRAO central retinal artery occlusion; BRAO branch retinal artery occlusion; RT retinal tear; SB scleral buckle; PPV pars plana vitrectomy; VH Vitreous hemorrhage; PRP panretinal laser photocoagulation; IVK intravitreal kenalog; VMT vitreomacular traction; MH Macular hole;  NVD neovascularization of the disc; NVE neovascularization elsewhere; AREDS age related eye disease study; ARMD age related macular degeneration; POAG primary open angle glaucoma; EBMD epithelial/anterior basement membrane dystrophy; ACIOL anterior chamber intraocular lens; IOL intraocular lens; PCIOL posterior chamber intraocular lens; Phaco/IOL phacoemulsification with intraocular lens placement; PPine Grovephotorefractive keratectomy; LASIK laser assisted in situ keratomileusis; HTN hypertension; DM diabetes mellitus; COPD chronic obstructive pulmonary disease

## 2020-03-11 NOTE — Assessment & Plan Note (Signed)
Longstanding, wet ARMD, now with geographic atrophy surrounding the remaining foveal island of pigment.  Less perifoveal CME.,  Will repeat Lucentis OD today and plan for examination alone OD here after

## 2020-03-12 ENCOUNTER — Telehealth (HOSPITAL_COMMUNITY): Payer: Self-pay | Admitting: *Deleted

## 2020-03-12 NOTE — Telephone Encounter (Signed)
Pt called in stating she is having slight lower extremity swelling since starting cardizem 60mg . Swelling is gone in AM but increases throughout the day. No shortness of breath, abdominal bloating or pain noted. Per Adline Peals PA will continue with current regimen unless symptoms progress or swelling becomes bothersome. Pt denies weight gain. Pt would prefer to change in therapy since expected side effect but will call if changes.

## 2020-03-28 ENCOUNTER — Other Ambulatory Visit (HOSPITAL_COMMUNITY): Payer: Self-pay | Admitting: Physician Assistant

## 2020-04-01 ENCOUNTER — Other Ambulatory Visit: Payer: Self-pay

## 2020-04-01 ENCOUNTER — Ambulatory Visit (HOSPITAL_COMMUNITY)
Admission: RE | Admit: 2020-04-01 | Discharge: 2020-04-01 | Disposition: A | Discharge status: Home / Self Care | Payer: Medicare Other | Source: Ambulatory Visit | Attending: Physician Assistant | Admitting: Physician Assistant

## 2020-04-01 ENCOUNTER — Ambulatory Visit (HOSPITAL_BASED_OUTPATIENT_CLINIC_OR_DEPARTMENT_OTHER)
Admission: RE | Admit: 2020-04-01 | Discharge: 2020-04-01 | Disposition: A | Discharge status: Home / Self Care | Payer: Medicare Other | Source: Ambulatory Visit | Attending: Physician Assistant | Admitting: Physician Assistant

## 2020-04-01 VITALS — BP 156/40 | HR 65 | Ht 59.0 in | Wt 98.0 lb

## 2020-04-01 DIAGNOSIS — D6869 Other thrombophilia: Secondary | ICD-10-CM | POA: Insufficient documentation

## 2020-04-01 DIAGNOSIS — I083 Combined rheumatic disorders of mitral, aortic and tricuspid valves: Secondary | ICD-10-CM | POA: Diagnosis not present

## 2020-04-01 DIAGNOSIS — Z7901 Long term (current) use of anticoagulants: Secondary | ICD-10-CM | POA: Insufficient documentation

## 2020-04-01 DIAGNOSIS — Z79899 Other long term (current) drug therapy: Secondary | ICD-10-CM | POA: Insufficient documentation

## 2020-04-01 DIAGNOSIS — I10 Essential (primary) hypertension: Secondary | ICD-10-CM | POA: Insufficient documentation

## 2020-04-01 DIAGNOSIS — I480 Paroxysmal atrial fibrillation: Secondary | ICD-10-CM | POA: Insufficient documentation

## 2020-04-01 LAB — CBC
HCT: 35.4 % — ABNORMAL LOW (ref 36.0–46.0)
Hemoglobin: 11.1 g/dL — ABNORMAL LOW (ref 12.0–15.0)
MCH: 29.1 pg (ref 26.0–34.0)
MCHC: 31.4 g/dL (ref 30.0–36.0)
MCV: 92.7 fL (ref 80.0–100.0)
Platelets: 190 10*3/uL (ref 150–400)
RBC: 3.82 MIL/uL — ABNORMAL LOW (ref 3.87–5.11)
RDW: 12.9 % (ref 11.5–15.5)
WBC: 7.2 10*3/uL (ref 4.0–10.5)
nRBC: 0 % (ref 0.0–0.2)

## 2020-04-01 LAB — BASIC METABOLIC PANEL
Anion gap: 9 (ref 5–15)
BUN: 24 mg/dL — ABNORMAL HIGH (ref 8–23)
CO2: 28 mmol/L (ref 22–32)
Calcium: 9.1 mg/dL (ref 8.9–10.3)
Chloride: 104 mmol/L (ref 98–111)
Creatinine, Ser: 0.59 mg/dL (ref 0.44–1.00)
GFR calc Af Amer: >60 mL/min (ref >60)
GFR calc non Af Amer: >60 mL/min (ref >60)
Glucose, Bld: 100 mg/dL — ABNORMAL HIGH (ref 70–99)
Potassium: 4.3 mmol/L (ref 3.5–5.1)
Sodium: 141 mmol/L (ref 135–145)

## 2020-04-01 LAB — ECHOCARDIOGRAM COMPLETE
Area-P 1/2: 3.21 cm2
P 1/2 time: 419 msec
S' Lateral: 3.3 cm

## 2020-04-01 NOTE — Progress Notes (Signed)
  Echocardiogram 2D Echocardiogram has been performed.  Jennette Dubin 04/01/2020, 9:59 AM

## 2020-04-01 NOTE — Progress Notes (Signed)
Primary Care Physician: Leanna Battles, MD Primary Cardiologist: none Primary Electrophysiologist: none Referring Physician: [facility]   Dawn Hampton is a 86 y.o. female with a history of HTN and new onset atrial fibrillation who presents for follow up in the [facility]. The patient was initially diagnosed with atrial fibrillation on 02/28/20 after presenting to the ED with symptoms of palpitations, lightheadedness, and a "shaky" feeling. ECG showed rapid afib HR 190s. She was given diltiazem and converted in the ED. She was also diagnosed with a UTI in the ED and started on antibiotics. She was discharged on diltiazem. She denies significant snoring or alcohol use. She denies ever having palpitations before. Patient is on Eliquis for a CHADS2VASC score of 4.  On follow up today, patient reports she has done reasonably well since her last visit. She has noted some increased foot swelling since starting diltiazem but she states this is not too bothersome for her. She denies any heart racing or palpitations.   Today, she denies symptoms of palpitations, chest pain, shortness of breath, orthopnea, PND, dizziness, presyncope, syncope, snoring, daytime somnolence, bleeding, or neurologic sequela. The patient is tolerating medications without difficulties and is otherwise without complaint today.    Atrial Fibrillation Risk Factors:  she does not have symptoms or diagnosis of sleep apnea. she does not have a history of rheumatic fever. she does not have a history of alcohol use. The patient does not have a history of early familial atrial fibrillation or other arrhythmias.  she has a BMI of Body mass index is 19.79 kg/m.Marland Kitchen Filed Weights   04/01/20 1009  Weight: 44.5 kg    No family history on file.   Atrial Fibrillation Management history:  Previous antiarrhythmic drugs: none Previous cardioversions: none Previous ablations: none CHADS2VASC  score: 4 Anticoagulation history: Eliquis   Past Medical History:  Diagnosis Date  . Abdominal mass, right lower quadrant   . Anemia   . Hypertension   . Macular degeneration   . Osteoporosis   . Shingles    T8   Past Surgical History:  Procedure Laterality Date  . cataracts  2004  . PUD, partial gastrectomy 1992    . TOTAL ABDOMINAL HYSTERECTOMY W/ BILATERAL SALPINGOOPHORECTOMY  1975    Current Outpatient Medications  Medication Sig Dispense Refill  . Acetaminophen (TYLENOL ARTHRITIS PAIN PO) Take 2 tablets by mouth in the morning.    Marland Kitchen amLODipine (NORVASC) 5 MG tablet Take 5 mg by mouth daily.    . beta carotene w/minerals (OCUVITE) tablet Take 1 tablet by mouth daily.    . calcium carbonate (TUMS - DOSED IN MG ELEMENTAL CALCIUM) 500 MG chewable tablet Chew 1 tablet by mouth 2 (two) times daily.     Marland Kitchen diltiazem (CARDIZEM SR) 60 MG 12 hr capsule Take 1 capsule (60 mg total) by mouth 2 (two) times daily. 60 capsule 2  . ELIQUIS 2.5 MG TABS tablet TAKE 1 TABLET BY MOUTH TWICE A DAY 60 tablet 1  . iron polysaccharides (NIFEREX) 150 MG capsule Take 150 mg by mouth 2 (two) times daily.     Marland Kitchen latanoprost (XALATAN) 0.005 % ophthalmic solution 1 drop at bedtime.    Marland Kitchen loperamide (IMODIUM A-D) 2 MG tablet Take 2 mg by mouth as needed.     . loratadine (CLARITIN) 10 MG tablet Take 10 mg by mouth daily.    Marland Kitchen losartan (COZAAR) 100 MG tablet Take 100 mg by mouth daily.    Marland Kitchen  Polyethyl Glycol-Propyl Glycol (SYSTANE OP) Apply 1 drop to eye 2 (two) times daily.    . Probiotic Product (PROBIOTIC ACIDOPHILUS BEADS) CAPS Take by mouth.    . Vitamin D, Ergocalciferol, (DRISDOL) 50000 UNITS CAPS capsule Take 50,000 Units by mouth every 7 (seven) days.     No current facility-administered medications for this encounter.    Allergies  Allergen Reactions  . Bacitracin-Polymyxin B Anaphylaxis  . Sulfa Antibiotics Itching  . Penicillins Swelling and Rash    Swelling located at injection site     Social History   Socioeconomic History  . Marital status: Widowed    Spouse name: Not on file  . Number of children: Not on file  . Years of education: Not on file  . Highest education level: Not on file  Occupational History  . Not on file  Tobacco Use  . Smoking status: Never Smoker  . Smokeless tobacco: Never Used  Substance and Sexual Activity  . Alcohol use: No  . Drug use: Not on file  . Sexual activity: Not on file  Other Topics Concern  . Not on file  Social History Narrative  . Not on file   Social Determinants of Health   Financial Resource Strain:   . Difficulty of Paying Living Expenses:   Food Insecurity:   . Worried About Charity fundraiser in the Last Year:   . Arboriculturist in the Last Year:   Transportation Needs:   . Film/video editor (Medical):   Marland Kitchen Lack of Transportation (Non-Medical):   Physical Activity:   . Days of Exercise per Week:   . Minutes of Exercise per Session:   Stress:   . Feeling of Stress :   Social Connections:   . Frequency of Communication with Friends and Family:   . Frequency of Social Gatherings with Friends and Family:   . Attends Religious Services:   . Active Member of Clubs or Organizations:   . Attends Archivist Meetings:   Marland Kitchen Marital Status:   Intimate Partner Violence:   . Fear of Current or Ex-Partner:   . Emotionally Abused:   Marland Kitchen Physically Abused:   . Sexually Abused:      ROS- All systems are reviewed and negative except as per the HPI above.  Physical Exam: Vitals:   04/01/20 1009  Pulse: 65  Weight: 44.5 kg  Height: 4\' 11"  (1.499 m)    GEN- The patient is well appearing elderly female, alert and oriented x 3 today.   HEENT-head normocephalic, atraumatic, sclera clear, conjunctiva pink, hearing intact, trachea midline. Lungs- Clear to ausculation bilaterally, normal work of breathing Heart- Regular rate and rhythm, no murmurs, rubs or gallops  GI- soft, NT, ND, +  BS Extremities- no clubbing, cyanosis. Trace bilateral edema on feet MS- no significant deformity or atrophy Skin- no rash or lesion Psych- euthymic mood, full affect Neuro- strength and sensation are intact   Wt Readings from Last 3 Encounters:  04/01/20 44.5 kg  02/29/20 43.8 kg  02/28/20 42.2 kg    EKG today demonstrates SR HR 65, PACs, PR 152, QRS 136, QTc 474  Epic records are reviewed at length today  CHA2DS2-VASc Score = 4  The patient's score is based upon: CHF History: 0 HTN History: 1 Age : 2 Diabetes History: 0 Stroke History: 0 Vascular Disease History: 0 Gender: 1      ASSESSMENT AND PLAN: 1. Paroxysmal Atrial Fibrillation (ICD10:  I48.0) The patient's CHA2DS2-VASc  score is 4, indicating a 4.8% annual risk of stroke.   Patient appears to be maintaining SR. Continue diltiazem 60 mg BID. If swelling becomes bothersome, would consider changing to metoprolol 12.5 mg BID. Patient prefers to continue present therapy.  Continue Eliquis 2.5 mg BID (age, weight). Check bmet/CBC today. Echo results pending  2. Secondary Hypercoagulable State (ICD10:  D68.69) The patient is at significant risk for stroke/thromboembolism based upon her CHA2DS2-VASc Score of 4.  Continue Apixaban (Eliquis).   3. HTN Stable, no changes today.   Follow up in the AF clinic in 3 months.    [facility] [address] [place], [place] [address] [phone] 04/01/2020 10:29 AM

## 2020-04-05 ENCOUNTER — Encounter (HOSPITAL_COMMUNITY): Payer: Self-pay | Admitting: *Deleted

## 2020-04-05 ENCOUNTER — Other Ambulatory Visit (HOSPITAL_COMMUNITY): Payer: Self-pay | Admitting: *Deleted

## 2020-04-05 DIAGNOSIS — I480 Paroxysmal atrial fibrillation: Secondary | ICD-10-CM

## 2020-04-08 ENCOUNTER — Ambulatory Visit (INDEPENDENT_AMBULATORY_CARE_PROVIDER_SITE_OTHER): Payer: Medicare Other | Admitting: Ophthalmology

## 2020-04-08 ENCOUNTER — Encounter (INDEPENDENT_AMBULATORY_CARE_PROVIDER_SITE_OTHER): Payer: Self-pay | Admitting: Ophthalmology

## 2020-04-08 ENCOUNTER — Other Ambulatory Visit: Payer: Self-pay

## 2020-04-08 DIAGNOSIS — H353221 Exudative age-related macular degeneration, left eye, with active choroidal neovascularization: Secondary | ICD-10-CM

## 2020-04-08 MED ORDER — RANIBIZUMAB 0.5 MG/0.05ML IZ SOLN FOR KALEIDOSCOPE
0.5000 mg | INTRAVITREAL | Status: AC | PRN
  Administered 2020-04-08: .5 mg via INTRAVITREAL

## 2020-04-08 NOTE — Progress Notes (Signed)
04/08/2020     CHIEF COMPLAINT Patient presents for Retina Follow Up   HISTORY OF PRESENT ILLNESS: Dawn Hampton is a 86 y.o. female who presents to the clinic today for:   HPI    Retina Follow Up    Patient presents with  Wet AMD.  In left eye.  Duration of 7 weeks.  Since onset it is stable.          Comments    7 week follow up - OCT OU, Lucentis OS Patient denies change in vision and overall has no complaints.        Last edited by Gerda Diss on 04/08/2020 10:51 AM. (History)      Referring physician: Leanna Battles, MD [place],  [place] [address]  HISTORICAL INFORMATION:   Selected notes from the MEDICAL RECORD NUMBER       CURRENT MEDICATIONS: Current Outpatient Medications (Ophthalmic Drugs)  Medication Sig  . latanoprost (XALATAN) 0.005 % ophthalmic solution 1 drop at bedtime.  Vladimir Faster Glycol-Propyl Glycol (SYSTANE OP) Apply 1 drop to eye 2 (two) times daily.   No current facility-administered medications for this visit. (Ophthalmic Drugs)   Current Outpatient Medications (Other)  Medication Sig  . Acetaminophen (TYLENOL ARTHRITIS PAIN PO) Take 2 tablets by mouth in the morning.  Marland Kitchen amLODipine (NORVASC) 5 MG tablet Take 5 mg by mouth daily.  . beta carotene w/minerals (OCUVITE) tablet Take 1 tablet by mouth daily.  . calcium carbonate (TUMS - DOSED IN MG ELEMENTAL CALCIUM) 500 MG chewable tablet Chew 1 tablet by mouth 2 (two) times daily.   Marland Kitchen diltiazem (CARDIZEM SR) 60 MG 12 hr capsule Take 1 capsule (60 mg total) by mouth 2 (two) times daily.  Marland Kitchen ELIQUIS 2.5 MG TABS tablet TAKE 1 TABLET BY MOUTH TWICE A DAY  . iron polysaccharides (NIFEREX) 150 MG capsule Take 150 mg by mouth 2 (two) times daily.   Marland Kitchen loperamide (IMODIUM A-D) 2 MG tablet Take 2 mg by mouth as needed.   . loratadine (CLARITIN) 10 MG tablet Take 10 mg by mouth daily.  Marland Kitchen losartan (COZAAR) 100 MG tablet Take 100 mg by mouth daily.  . Probiotic Product (PROBIOTIC  ACIDOPHILUS BEADS) CAPS Take by mouth.  . Vitamin D, Ergocalciferol, (DRISDOL) 50000 UNITS CAPS capsule Take 50,000 Units by mouth every 7 (seven) days.   No current facility-administered medications for this visit. (Other)      REVIEW OF SYSTEMS:    ALLERGIES Allergies  Allergen Reactions  . Bacitracin-Polymyxin B Anaphylaxis  . Sulfa Antibiotics Itching  . Penicillins Swelling and Rash    Swelling located at injection site    PAST MEDICAL HISTORY Past Medical History:  Diagnosis Date  . Abdominal mass, right lower quadrant   . Anemia   . Hypertension   . Macular degeneration   . Osteoporosis   . Shingles    T8   Past Surgical History:  Procedure Laterality Date  . cataracts  2004  . PUD, partial gastrectomy 1992    . TOTAL ABDOMINAL HYSTERECTOMY W/ BILATERAL SALPINGOOPHORECTOMY  1975    FAMILY HISTORY History reviewed. No pertinent family history.  SOCIAL HISTORY Social History   Tobacco Use  . Smoking status: Never Smoker  . Smokeless tobacco: Never Used  Substance Use Topics  . Alcohol use: No  . Drug use: Not on file         OPHTHALMIC EXAM:  Base Eye Exam    Visual Acuity (Snellen -  Linear)      Right Left   Dist cc 20/70+2 20/50   Dist ph cc 20/60-2 20/40-2   Correction: Glasses       Tonometry (Tonopen, 11:03 AM)      Right Left   Pressure 18 21       Pupils      Pupils Dark Light Shape React APD   Right PERRL 3 2 Round Minimal None   Left PERRL 3 2 Round Minimal None       Visual Fields (Counting fingers)      Left Right    Full Full       Extraocular Movement      Right Left    Full Full       Neuro/Psych    Oriented x3: Yes   Mood/Affect: Normal       Dilation    Left eye: 1.0% Mydriacyl, 2.5% Phenylephrine @ 11:03 AM        Slit Lamp and Fundus Exam    External Exam      Right Left   External Normal Normal       Slit Lamp Exam      Right Left   Lids/Lashes Normal Normal   Conjunctiva/Sclera White and  quiet White and quiet   Cornea Clear Clear   Anterior Chamber Deep and quiet Deep and quiet   Iris Round and reactive Round and reactive   Lens Posterior chamber intraocular lens Posterior chamber intraocular lens   Anterior Vitreous Normal Normal       Fundus Exam      Right Left   Posterior Vitreous  Posterior vitreous detachment   Disc  Normal   C/D Ratio  0.65   Macula  Geographic atrophy, Advanced age related macular degeneration, Hard drusen, no exudates, no hemorrhage, no macular thickening, Subretinal neovascular membrane, Retinal pigment epithelial mottling   Vessels  Normal   Periphery  Normal          IMAGING AND PROCEDURES  Imaging and Procedures for 04/08/20  OCT, Retina - OU - Both Eyes       Right Eye Quality was good. Scan locations included subfoveal. Central Foveal Thickness: 302. Progression has been stable. Findings include no IRF, no SRF, retinal drusen , subretinal hyper-reflective material, subretinal scarring, central retinal atrophy, outer retinal atrophy.   Left Eye Quality was good. Scan locations included subfoveal. Central Foveal Thickness: 260. Progression has been stable. Findings include abnormal foveal contour, no SRF, no IRF, subretinal scarring, central retinal atrophy, outer retinal atrophy, cystoid macular edema.   Notes OS, chronic CME temporal to the fovea, stable over the last 7 weeks.  Pete intravitreal Lucentis OS today and examination in 8 weeks       Intravitreal Injection, Pharmacologic Agent - OS - Left Eye       Time Out 04/08/2020. 11:19 AM. Confirmed correct patient, procedure, site, and patient consented.   Anesthesia Topical anesthesia was used. Anesthetic medications included Akten 3.5%.   Procedure Preparation included Tobramycin 0.3%, 5% betadine to ocular surface. A 30 gauge needle was used.   Injection:  0.5 mg ranibizumab (LUCENTIS) 0.5 MG/0.05ML intravitreal injection   NDC: 50242-080-01, Lot: [id number]    Route: Intravitreal, Site: Left Eye, Waste: 0 mg  Post-op Post injection exam found visual acuity of at least counting fingers. The patient tolerated the procedure well. There were no complications. The patient received written and verbal post procedure care education. Post injection medications were not given.  ASSESSMENT/PLAN:  Exudative age-related macular degeneration of left eye with active choroidal neovascularization (HCC) The nature of wet macular degeneration was discussed with the patient.  Forms of therapy reviewed include the use of Anti-VEGF medications injected painlessly into the eye, as well as other possible treatment modalities, including thermal laser therapy. Fellow eye involvement and risks were discussed with the patient. Upon the finding of wet age related macular degeneration, treatment will be offered. The treatment regimen is on a treat as needed basis with the intent to treat if necessary and extend interval of exams when possible. On average 1 out of 6 patients do not need lifetime therapy. However, the risk of recurrent disease is high for a lifetime.  Initially monthly, then periodic, examinations and evaluations will determine whether the next treatment is required on the day of the examination. OS, with wet ARMD, active CME yet stable at 7-week interval post intravitreal Lucentis.  We will repeat  Lucentis OS today examination in 8 weeks      ICD-10-CM   1. 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  Repeat intravitreal Lucentis OS today and examination in 8 weeks  2.  Examination of the right eye is scheduled  3.  Ophthalmic Meds Ordered this visit:  Meds ordered this encounter  Medications  .  ranibizumab (LUCENTIS) 0.5 MG/0.05ML intravitreal injection 0.5 mg       Return in about 8 weeks (around 06/03/2020) for DILATE OU, LUCENTIS 0.5 OCT, OS.  Patient Instructions  Patient to notify the office of new visual acuity decline or distortions in either eye    Explained the diagnoses, plan, and follow up with the patient and they expressed understanding.  Patient expressed understanding of the importance of proper follow up care.   Clent Demark Alanzo Lamb M.D. Diseases & Surgery of the Retina and Vitreous [facility] [place] 04/08/20     Abbreviations: M myopia (nearsighted); A astigmatism; H hyperopia (farsighted); P presbyopia; Mrx spectacle prescription;  CTL contact lenses; OD right eye; OS left eye; OU both eyes  XT exotropia; ET esotropia; PEK punctate epithelial keratitis; PEE punctate epithelial erosions; DES dry eye syndrome; MGD meibomian gland dysfunction; ATs artificial tears; PFAT's preservative free artificial tears; [place] nuclear sclerotic cataract; PSC posterior subcapsular cataract; ERM epi-retinal membrane; PVD posterior vitreous detachment; RD retinal detachment; DM diabetes mellitus; DR diabetic retinopathy; NPDR non-proliferative diabetic retinopathy; PDR proliferative diabetic retinopathy; CSME clinically significant macular edema; DME diabetic macular edema; dbh dot blot hemorrhages; CWS cotton wool spot; POAG primary open angle glaucoma; C/D cup-to-disc ratio; HVF humphrey visual field; GVF goldmann visual field; OCT optical coherence tomography; IOP intraocular pressure; BRVO Branch retinal vein occlusion; CRVO central retinal vein occlusion; CRAO central retinal artery occlusion; BRAO branch retinal artery occlusion; RT retinal tear; SB scleral buckle; PPV pars plana vitrectomy; VH Vitreous hemorrhage; PRP panretinal laser photocoagulation; IVK intravitreal kenalog; VMT vitreomacular traction; MH Macular hole;  NVD neovascularization of the disc; NVE  neovascularization elsewhere; AREDS age related eye disease study; ARMD age related macular degeneration; POAG primary open angle glaucoma; EBMD epithelial/anterior basement membrane dystrophy; ACIOL anterior chamber intraocular lens; IOL intraocular lens; PCIOL posterior chamber intraocular lens; Phaco/IOL phacoemulsification with intraocular lens placement; PRK photorefractive keratectomy; LASIK laser assisted in situ keratomileusis; HTN  hypertension; DM diabetes mellitus; COPD chronic obstructive pulmonary disease

## 2020-04-08 NOTE — Assessment & Plan Note (Addendum)
The nature of wet macular degeneration was discussed with the patient.  Forms of therapy reviewed include the use of Anti-VEGF medications injected painlessly into the eye, as well as other possible treatment modalities, including thermal laser therapy. Fellow eye involvement and risks were discussed with the patient. Upon the finding of wet age related macular degeneration, treatment will be offered. The treatment regimen is on a treat as needed basis with the intent to treat if necessary and extend interval of exams when possible. On average 1 out of 6 patients do not need lifetime therapy. However, the risk of recurrent disease is high for a lifetime.  Initially monthly, then periodic, examinations and evaluations will determine whether the next treatment is required on the day of the examination. OS, with wet ARMD, active CME yet stable at 7-week interval post intravitreal Lucentis.  We will repeat  Lucentis OS today examination in 8 weeks

## 2020-04-08 NOTE — Patient Instructions (Signed)
Patient to notify the office of new visual acuity decline or distortions in either eye

## 2020-04-29 ENCOUNTER — Ambulatory Visit
Admission: RE | Admit: 2020-04-29 | Discharge: 2020-04-29 | Disposition: A | Discharge status: Home / Self Care | Payer: Medicare Other | Source: Ambulatory Visit | Attending: Internal Medicine | Admitting: Internal Medicine

## 2020-04-29 ENCOUNTER — Other Ambulatory Visit: Payer: Self-pay

## 2020-04-29 DIAGNOSIS — M8588 Other specified disorders of bone density and structure, other site: Secondary | ICD-10-CM | POA: Diagnosis not present

## 2020-04-29 DIAGNOSIS — M810 Age-related osteoporosis without current pathological fracture: Secondary | ICD-10-CM

## 2020-04-29 DIAGNOSIS — Z780 Asymptomatic menopausal state: Secondary | ICD-10-CM | POA: Diagnosis not present

## 2020-04-30 DIAGNOSIS — M810 Age-related osteoporosis without current pathological fracture: Secondary | ICD-10-CM | POA: Diagnosis not present

## 2020-04-30 DIAGNOSIS — I10 Essential (primary) hypertension: Secondary | ICD-10-CM | POA: Diagnosis not present

## 2020-04-30 DIAGNOSIS — E785 Hyperlipidemia, unspecified: Secondary | ICD-10-CM | POA: Diagnosis not present

## 2020-04-30 DIAGNOSIS — R1903 Right lower quadrant abdominal swelling, mass and lump: Secondary | ICD-10-CM | POA: Diagnosis not present

## 2020-04-30 DIAGNOSIS — I480 Paroxysmal atrial fibrillation: Secondary | ICD-10-CM | POA: Diagnosis not present

## 2020-04-30 DIAGNOSIS — R2681 Unsteadiness on feet: Secondary | ICD-10-CM | POA: Diagnosis not present

## 2020-04-30 DIAGNOSIS — M545 Low back pain: Secondary | ICD-10-CM | POA: Diagnosis not present

## 2020-04-30 DIAGNOSIS — I872 Venous insufficiency (chronic) (peripheral): Secondary | ICD-10-CM | POA: Diagnosis not present

## 2020-05-09 ENCOUNTER — Other Ambulatory Visit: Payer: Self-pay

## 2020-05-09 ENCOUNTER — Emergency Department (HOSPITAL_COMMUNITY)
Admission: EM | Admit: 2020-05-09 | Discharge: 2020-05-09 | Disposition: A | Discharge status: Home / Self Care | Payer: Medicare Other | Attending: Emergency Medicine | Admitting: Emergency Medicine

## 2020-05-09 ENCOUNTER — Encounter (HOSPITAL_COMMUNITY): Payer: Self-pay | Admitting: Emergency Medicine

## 2020-05-09 DIAGNOSIS — R079 Chest pain, unspecified: Secondary | ICD-10-CM | POA: Diagnosis not present

## 2020-05-09 DIAGNOSIS — Z79899 Other long term (current) drug therapy: Secondary | ICD-10-CM | POA: Insufficient documentation

## 2020-05-09 DIAGNOSIS — I4891 Unspecified atrial fibrillation: Secondary | ICD-10-CM | POA: Diagnosis not present

## 2020-05-09 DIAGNOSIS — Z7901 Long term (current) use of anticoagulants: Secondary | ICD-10-CM | POA: Insufficient documentation

## 2020-05-09 DIAGNOSIS — R000 Tachycardia, unspecified: Secondary | ICD-10-CM | POA: Diagnosis not present

## 2020-05-09 DIAGNOSIS — I10 Essential (primary) hypertension: Secondary | ICD-10-CM | POA: Diagnosis not present

## 2020-05-09 DIAGNOSIS — R0602 Shortness of breath: Secondary | ICD-10-CM | POA: Diagnosis not present

## 2020-05-09 DIAGNOSIS — R0789 Other chest pain: Secondary | ICD-10-CM | POA: Diagnosis not present

## 2020-05-09 LAB — CBC WITH DIFFERENTIAL/PLATELET
Abs Immature Granulocytes: 0.03 10*3/uL (ref 0.00–0.07)
Basophils Absolute: 0 10*3/uL (ref 0.0–0.1)
Basophils Relative: 1 %
Eosinophils Absolute: 0 10*3/uL (ref 0.0–0.5)
Eosinophils Relative: 0 %
HCT: 33.5 % — ABNORMAL LOW (ref 36.0–46.0)
Hemoglobin: 10.6 g/dL — ABNORMAL LOW (ref 12.0–15.0)
Immature Granulocytes: 0 %
Lymphocytes Relative: 10 %
Lymphs Abs: 0.8 10*3/uL (ref 0.7–4.0)
MCH: 28.7 pg (ref 26.0–34.0)
MCHC: 31.6 g/dL (ref 30.0–36.0)
MCV: 90.8 fL (ref 80.0–100.0)
Monocytes Absolute: 0.4 10*3/uL (ref 0.1–1.0)
Monocytes Relative: 5 %
Neutro Abs: 6.8 10*3/uL (ref 1.7–7.7)
Neutrophils Relative %: 84 %
Platelets: 157 10*3/uL (ref 150–400)
RBC: 3.69 MIL/uL — ABNORMAL LOW (ref 3.87–5.11)
RDW: 13.3 % (ref 11.5–15.5)
WBC: 8 10*3/uL (ref 4.0–10.5)
nRBC: 0 % (ref 0.0–0.2)

## 2020-05-09 LAB — BASIC METABOLIC PANEL
Anion gap: 11 (ref 5–15)
BUN: 18 mg/dL (ref 8–23)
CO2: 24 mmol/L (ref 22–32)
Calcium: 8.7 mg/dL — ABNORMAL LOW (ref 8.9–10.3)
Chloride: 106 mmol/L (ref 98–111)
Creatinine, Ser: 0.58 mg/dL (ref 0.44–1.00)
GFR calc Af Amer: >60 mL/min (ref >60)
GFR calc non Af Amer: >60 mL/min (ref >60)
Glucose, Bld: 131 mg/dL — ABNORMAL HIGH (ref 70–99)
Potassium: 3.8 mmol/L (ref 3.5–5.1)
Sodium: 141 mmol/L (ref 135–145)

## 2020-05-09 LAB — MAGNESIUM: Magnesium: 1.8 mg/dL (ref 1.7–2.4)

## 2020-05-09 NOTE — ED Provider Notes (Signed)
[facility] EMERGENCY DEPARTMENT Provider Note   CSN: [id number] Arrival date & time: 05/09/20  0725     History Chief Complaint  Patient presents with  . Chest Pain    Dawn Hampton is a 86 y.o. female.  HPI   86 year old female with chest tightness.  Onset early this morning.  She felt like she had a band around her chest.  Onset while at rest.  EMS called.  They found her in atrial fibrillation with rapid ventricular response.  Rates over 150 at times.  She was given 10 mg of diltiazem intravenously.  She converted to a sinus rhythm.  Her symptoms also resolved with this.  She was recently diagnosed with atrial fibrillation couple months ago.  She is on diltiazem and anticoagulated on Eliquis.  She has been seen in the atrial fibrillation clinic.  She reports compliance with her medications.  Past Medical History:  Diagnosis Date  . Abdominal mass, right lower quadrant   . Anemia   . Hypertension   . Macular degeneration   . Osteoporosis   . Shingles    T8   Patient Active Problem List   Diagnosis Date Noted  . Paroxysmal atrial fibrillation ([place]) 02/29/2020  . Secondary hypercoagulable state ([place]) 02/29/2020  . Exudative age-related macular degeneration of right eye with active choroidal neovascularization ([place]) 12/11/2019  . 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 Advanced nonexudative age-related macular degeneration of right eye with subfoveal involvement 12/11/2019  . 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 Cystoid macular edema of left eye 12/11/2019  . Posterior vitreous detachment of left eye 12/11/2019    Past Surgical History:  Procedure Laterality Date  . cataracts  2004  . PUD, partial gastrectomy 1992    . TOTAL ABDOMINAL HYSTERECTOMY W/ BILATERAL SALPINGOOPHORECTOMY  1975    OB History    No obstetric history on file.    No family history on file.  Social History   Tobacco Use  . Smoking status: Never Smoker  . Smokeless tobacco: Never Used  Substance Use Topics  . Alcohol use: No  . Drug use: Never    Home Medications Prior to Admission medications   Medication Sig Start Date End Date Taking? Authorizing Provider  Acetaminophen (TYLENOL ARTHRITIS PAIN PO) Take 2 tablets by mouth in the morning.    [provider]  amLODipine (NORVASC) 5 MG tablet Take 5 mg by mouth daily.    [provider]  beta carotene w/minerals (OCUVITE) tablet Take 1 tablet by mouth daily.    [provider]  calcium carbonate (TUMS - DOSED IN MG ELEMENTAL CALCIUM) 500 MG chewable tablet Chew 1 tablet by mouth 2 (two) times daily.     [provider]  diltiazem (CARDIZEM SR) 60 MG 12 hr capsule Take 1 capsule (60 mg total) by mouth 2 (two) times daily. 02/28/20 05/28/20  Gareth Morgan, MD  ELIQUIS 2.5 MG TABS tablet TAKE 1 TABLET BY MOUTH TWICE A DAY 03/28/20   Fenton, Clint R, PA  iron polysaccharides (NIFEREX) 150 MG capsule Take 150 mg by mouth 2 (two) times daily.     [provider]  latanoprost (XALATAN) 0.005 % ophthalmic solution 1 drop at bedtime. 01/23/20   [provider]  loperamide (IMODIUM A-D) 2 MG tablet Take 2 mg by mouth as needed.     [provider]  loratadine (CLARITIN)  10 MG tablet Take 10 mg by mouth daily.    [provider]  losartan (COZAAR) 100 MG tablet Take 100 mg by mouth daily.    [provider]  Polyethyl Glycol-Propyl Glycol (SYSTANE OP) Apply 1 drop to eye 2 (two) times daily.    [provider]  Probiotic Product (PROBIOTIC ACIDOPHILUS BEADS) CAPS Take by mouth.    [provider]  Vitamin D, Ergocalciferol, (DRISDOL) 50000 UNITS CAPS capsule Take 50,000 Units by mouth every 7 (seven) days.    [provider]    Allergies    Bacitracin-polymyxin b,  Sulfa antibiotics, and Penicillins  Review of Systems   Review of Systems All systems reviewed and negative, other than as noted in HPI.  Physical Exam Updated Vital Signs BP (!) 161/57 (BP Location: Left Arm)   Pulse 77   Temp 98.3 F (36.8 C) (Oral)   Resp 20   Ht 4\' 11"  (1.499 m)   Wt 44.5 kg   SpO2 95%   BMI 19.81 kg/m   Physical Exam Vitals and nursing note reviewed.  Constitutional:      General: She is not in acute distress.    Appearance: She is well-developed.  HENT:     Head: Normocephalic and atraumatic.  Eyes:     General:        Right eye: No discharge.        Left eye: No discharge.     Conjunctiva/sclera: Conjunctivae normal.  Cardiovascular:     Rate and Rhythm: Normal rate and regular rhythm.     Heart sounds: Normal heart sounds. No murmur heard.  No friction rub. No gallop.   Pulmonary:     Effort: Pulmonary effort is normal. No respiratory distress.     Breath sounds: Normal breath sounds.  Abdominal:     General: There is no distension.     Palpations: Abdomen is soft.     Tenderness: There is no abdominal tenderness.  Musculoskeletal:        General: No tenderness.     Cervical back: Neck supple.  Skin:    General: Skin is warm and dry.  Neurological:     Mental Status: She is alert.  Psychiatric:        Behavior: Behavior normal.        Thought Content: Thought content normal.     ED Results / Procedures / Treatments   Labs (all labs ordered are listed, but only abnormal results are displayed) Labs Reviewed  CBC WITH DIFFERENTIAL/PLATELET - Abnormal; Notable for the following components:      Result Value   RBC 3.69 (*)    Hemoglobin 10.6 (*)    HCT 33.5 (*)    All other components within normal limits  BASIC METABOLIC PANEL - Abnormal; Notable for the following components:   Glucose, Bld 131 (*)    Calcium 8.7 (*)    All other components within normal limits  MAGNESIUM    EKG EKG Interpretation  Date/Time:  Thursday  May 09 2020 07:30:45 EDT Ventricular Rate:  80 PR Interval:    QRS Duration: 138 QT Interval:  401 QTC Calculation: 463 R Axis:   -35 Text Interpretation: Sinus rhythm frequent pvcs Left bundle branch block Confirmed by Virgel Manifold [id number]) on 05/09/2020 8:14:55 AM   Radiology No results found.  Procedures Procedures (including critical care time)  Medications Ordered in ED Medications - No data to display  ED Course  I have reviewed the  triage vital signs and the nursing notes.  Pertinent labs & imaging results that were available during my care of the patient were reviewed by me and considered in my medical decision making (see chart for details).    MDM Rules/Calculators/A&P                          86 year old female with chest tightness.  Prehospital strips reviewed.  Symptoms likely secondary to atrial fibrillation with RVR.  Symptoms resolved with conversion to a sinus rhythm and decreased rate.  No additional symptoms since.  She has a CHA2DS2-VASc score of 4.  She is on a reduced dose of Eliquis secondary to her age and weight.  Will check electrolytes and continue to observe at this time.  She has established with the atrial fibrillation clinic.  If work-up is fine and she remains asymptomatic then plan DC with outpt FU again.   Final Clinical Impression(s) / ED Diagnoses Final diagnoses:  Atrial fibrillation with rapid ventricular response [facility])    Rx / DC Orders ED Discharge Orders    None       Virgel Manifold, MD 05/09/20 [phone]

## 2020-05-09 NOTE — ED Triage Notes (Signed)
To ED via [facility] from home-- called EMS because she waas having a "tight, band like feeling around chest" on EMS arrival pt was in rapid heart rate- at 150s+ -- given CARDIZEM 10mg  IV, rate slowed down.  On arrival to ED pt in NSR with frequent PVCs. Son at bedside.  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

## 2020-05-11 ENCOUNTER — Emergency Department (HOSPITAL_COMMUNITY): Payer: Medicare Other

## 2020-05-11 ENCOUNTER — Emergency Department (HOSPITAL_COMMUNITY)
Admission: EM | Admit: 2020-05-11 | Discharge: 2020-05-12 | Disposition: A | Discharge status: Home / Self Care | Payer: Medicare Other | Attending: Emergency Medicine | Admitting: Emergency Medicine

## 2020-05-11 ENCOUNTER — Encounter (HOSPITAL_COMMUNITY): Payer: Self-pay | Admitting: Emergency Medicine

## 2020-05-11 ENCOUNTER — Other Ambulatory Visit: Payer: Self-pay

## 2020-05-11 DIAGNOSIS — I10 Essential (primary) hypertension: Secondary | ICD-10-CM | POA: Insufficient documentation

## 2020-05-11 DIAGNOSIS — Z7901 Long term (current) use of anticoagulants: Secondary | ICD-10-CM | POA: Diagnosis not present

## 2020-05-11 DIAGNOSIS — I447 Left bundle-branch block, unspecified: Secondary | ICD-10-CM | POA: Diagnosis not present

## 2020-05-11 DIAGNOSIS — Z79899 Other long term (current) drug therapy: Secondary | ICD-10-CM | POA: Insufficient documentation

## 2020-05-11 DIAGNOSIS — R42 Dizziness and giddiness: Secondary | ICD-10-CM | POA: Diagnosis not present

## 2020-05-11 DIAGNOSIS — R002 Palpitations: Secondary | ICD-10-CM | POA: Diagnosis not present

## 2020-05-11 DIAGNOSIS — I480 Paroxysmal atrial fibrillation: Secondary | ICD-10-CM | POA: Diagnosis not present

## 2020-05-11 DIAGNOSIS — I491 Atrial premature depolarization: Secondary | ICD-10-CM | POA: Diagnosis not present

## 2020-05-11 DIAGNOSIS — J90 Pleural effusion, not elsewhere classified: Secondary | ICD-10-CM | POA: Diagnosis not present

## 2020-05-11 LAB — COMPREHENSIVE METABOLIC PANEL
ALT: 24 U/L (ref 0–44)
AST: 35 U/L (ref 15–41)
Albumin: 3.4 g/dL — ABNORMAL LOW (ref 3.5–5.0)
Alkaline Phosphatase: 65 U/L (ref 38–126)
Anion gap: 10 (ref 5–15)
BUN: 17 mg/dL (ref 8–23)
CO2: 25 mmol/L (ref 22–32)
Calcium: 8.8 mg/dL — ABNORMAL LOW (ref 8.9–10.3)
Chloride: 104 mmol/L (ref 98–111)
Creatinine, Ser: 0.66 mg/dL (ref 0.44–1.00)
GFR calc Af Amer: >60 mL/min (ref >60)
GFR calc non Af Amer: >60 mL/min (ref >60)
Glucose, Bld: 150 mg/dL — ABNORMAL HIGH (ref 70–99)
Potassium: 3.8 mmol/L (ref 3.5–5.1)
Sodium: 139 mmol/L (ref 135–145)
Total Bilirubin: 0.2 mg/dL — ABNORMAL LOW (ref 0.3–1.2)
Total Protein: 5.6 g/dL — ABNORMAL LOW (ref 6.5–8.1)

## 2020-05-11 LAB — CBC WITH DIFFERENTIAL/PLATELET
Abs Immature Granulocytes: 0.02 10*3/uL (ref 0.00–0.07)
Basophils Absolute: 0.1 10*3/uL (ref 0.0–0.1)
Basophils Relative: 1 %
Eosinophils Absolute: 0.1 10*3/uL (ref 0.0–0.5)
Eosinophils Relative: 1 %
HCT: 32.1 % — ABNORMAL LOW (ref 36.0–46.0)
Hemoglobin: 9.9 g/dL — ABNORMAL LOW (ref 12.0–15.0)
Immature Granulocytes: 0 %
Lymphocytes Relative: 13 %
Lymphs Abs: 1.2 10*3/uL (ref 0.7–4.0)
MCH: 28 pg (ref 26.0–34.0)
MCHC: 30.8 g/dL (ref 30.0–36.0)
MCV: 90.9 fL (ref 80.0–100.0)
Monocytes Absolute: 0.7 10*3/uL (ref 0.1–1.0)
Monocytes Relative: 7 %
Neutro Abs: 7.5 10*3/uL (ref 1.7–7.7)
Neutrophils Relative %: 78 %
Platelets: 142 10*3/uL — ABNORMAL LOW (ref 150–400)
RBC: 3.53 MIL/uL — ABNORMAL LOW (ref 3.87–5.11)
RDW: 13.4 % (ref 11.5–15.5)
WBC: 9.5 10*3/uL (ref 4.0–10.5)
nRBC: 0 % (ref 0.0–0.2)

## 2020-05-11 LAB — TSH: TSH: 1.55 u[IU]/mL (ref 0.350–4.500)

## 2020-05-11 LAB — TROPONIN I (HIGH SENSITIVITY): Troponin I (High Sensitivity): 30 ng/L — ABNORMAL HIGH (ref <18)

## 2020-05-11 LAB — MAGNESIUM: Magnesium: 2.2 mg/dL (ref 1.7–2.4)

## 2020-05-11 NOTE — ED Triage Notes (Signed)
Pt presents to ED from home BIB [facility]. Pt c/o feeling palpitations beginning around 2100. Per pt she was resting when s/s onset. Pt now weak w/ ambulation. Per EMS pt found in w BBB, in route pt began a. fib

## 2020-05-11 NOTE — ED Provider Notes (Signed)
[facility] EMERGENCY DEPARTMENT Provider Note   CSN: [id number] Arrival date & time: 05/11/20  2222     History Chief Complaint  Patient presents with  . Palpitations    Mariposa Ward [name] is a 86 y.o. female.  The history is provided by the patient, medical records and the EMS personnel.  Palpitations   86 year old female with history of hypertension, anemia, macular degeneration, osteoporosis, paroxysmal A. fib on reduced dose of Eliquis, presenting to the ED with palpitations.  States she was sitting in her chair watching television around 9 PM when she had onset of palpitations.  She called the nurse line for the cardiology clinic who then proceeded to call 911.  EMS noted her to be in AFIB, since returned to [facility].  States her symptoms have improved since arrival to ED, however still feels tired/weak.  She states her first episode of AFIB was back in July but was seen here just a few days ago for same.  States she does not have another appointment with cardiology until October 1st (soonest they had per patient).  She has been compliant with all her medications.  No fever, chills, cough, or other illness.  States she was actually fine all day until this happened.  States she is not sure why she keeps having these episodes lately-- nothing has changed, no excessive caffeine intake, etc.  Past Medical History:  Diagnosis Date  . Abdominal mass, right lower quadrant   . Anemia   . Hypertension   . Macular degeneration   . Osteoporosis   . Shingles    T8    Patient Active Problem List   Diagnosis Date Noted  . Paroxysmal atrial fibrillation ([place]) 02/29/2020  . Secondary hypercoagulable state ([place]) 02/29/2020  . Exudative age-related macular degeneration of right eye with active choroidal neovascularization ([place]) 12/11/2019  . 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 Advanced nonexudative  age-related macular degeneration of right eye with subfoveal involvement 12/11/2019  . Exudative age-related macular degeneration of left eye with active choroidal neovascularization ([place]) 12/11/2019  . Cystoid macular edema of left eye 12/11/2019  . Posterior vitreous detachment of left eye 12/11/2019    Past Surgical History:  Procedure Laterality Date  . cataracts  2004  . PUD, partial gastrectomy 1992    . TOTAL ABDOMINAL HYSTERECTOMY W/ BILATERAL SALPINGOOPHORECTOMY  1975     OB History   No obstetric history on file.     History reviewed. No pertinent family history.  Social History   Tobacco Use  . Smoking status: Never Smoker  . Smokeless tobacco: Never Used  Substance Use Topics  . Alcohol use: No  . Drug use: Never    Home Medications Prior to Admission medications   Medication Sig Start Date End Date Taking? Authorizing Provider  Acetaminophen (TYLENOL ARTHRITIS PAIN PO) Take 2 tablets by mouth in the morning.    [provider]  amLODipine (NORVASC) 5 MG tablet Take 5 mg by mouth daily.    [provider]  beta carotene w/minerals (OCUVITE) tablet Take 1 tablet by mouth daily.    [provider]  calcium carbonate (TUMS - DOSED IN MG ELEMENTAL CALCIUM) 500 MG chewable tablet Chew 1 tablet by mouth 2 (two) times daily.     [provider]  diltiazem (CARDIZEM SR) 60 MG 12 hr capsule Take 1 capsule (60 mg total) by mouth 2 (two) times daily. 02/28/20 05/28/20  Gareth Morgan,  MD  ELIQUIS 2.5 MG TABS tablet TAKE 1 TABLET BY MOUTH TWICE A DAY 03/28/20   Fenton, Clint R, PA  iron polysaccharides (NIFEREX) 150 MG capsule Take 150 mg by mouth 2 (two) times daily.     [provider]  latanoprost (XALATAN) 0.005 % ophthalmic solution 1 drop at bedtime. 01/23/20   [provider]  loperamide (IMODIUM A-D) 2 MG tablet Take 2 mg by mouth as needed.     [provider]  loratadine (CLARITIN) 10 MG tablet Take 10 mg  by mouth daily.    [provider]  losartan (COZAAR) 100 MG tablet Take 100 mg by mouth daily.    [provider]  Polyethyl Glycol-Propyl Glycol (SYSTANE OP) Apply 1 drop to eye 2 (two) times daily.    [provider]  Probiotic Product (PROBIOTIC ACIDOPHILUS BEADS) CAPS Take by mouth.    [provider]  Vitamin D, Ergocalciferol, (DRISDOL) 50000 UNITS CAPS capsule Take 50,000 Units by mouth every 7 (seven) days.    [provider]    Allergies    Bacitracin-polymyxin b, Sulfa antibiotics, and Penicillins  Review of Systems   Review of Systems  Cardiovascular: Positive for palpitations.  All other systems reviewed and are negative.   Physical Exam Updated Vital Signs BP (!) 171/56 (BP Location: Right Arm)   Pulse 69   Temp 98.9 F (37.2 C) (Oral)   Resp (!) 22   SpO2 99%   Physical Exam Vitals and nursing note reviewed.  Constitutional:      Appearance: She is well-developed.     Comments: Elderly, thin  HENT:     Head: Normocephalic and atraumatic.  Eyes:     Conjunctiva/sclera: Conjunctivae normal.     Pupils: Pupils are equal, round, and reactive to light.  Cardiovascular:     Rate and Rhythm: Normal rate and regular rhythm.     Heart sounds: Normal heart sounds.     Comments: NSR with rate 80's on exam, few PVC's noted on tele monitor Pulmonary:     Effort: Pulmonary effort is normal.     Breath sounds: Normal breath sounds.  Abdominal:     General: Bowel sounds are normal.     Palpations: Abdomen is soft.  Musculoskeletal:        General: Normal range of motion.     Cervical back: Normal range of motion.     Comments: 1+ pitting edema of BLE  Skin:    General: Skin is warm and dry.  Neurological:     Mental Status: She is alert and oriented to person, place, and time.     ED Results / Procedures / Treatments   Labs (all labs ordered are listed, but only abnormal results are displayed) Labs Reviewed  CBC  WITH DIFFERENTIAL/PLATELET - Abnormal; Notable for the following components:      Result Value   RBC 3.53 (*)    Hemoglobin 9.9 (*)    HCT 32.1 (*)    Platelets 142 (*)    All other components within normal limits  COMPREHENSIVE METABOLIC PANEL - Abnormal; Notable for the following components:   Glucose, Bld 150 (*)    Calcium 8.8 (*)    Total Protein 5.6 (*)    Albumin 3.4 (*)    Total Bilirubin 0.2 (*)    All other components within normal limits  BRAIN NATRIURETIC PEPTIDE - Abnormal; Notable for the following components:   B Natriuretic Peptide 637.9 (*)  All other components within normal limits  TROPONIN I (HIGH SENSITIVITY) - Abnormal; Notable for the following components:   Troponin I (High Sensitivity) 30 (*)    All other components within normal limits  TROPONIN I (HIGH SENSITIVITY) - Abnormal; Notable for the following components:   Troponin I (High Sensitivity) 40 (*)    All other components within normal limits  TSH  MAGNESIUM  T4    EKG EKG Interpretation  Date/Time:  Saturday May 11 2020 22:26:23 EDT Ventricular Rate:  69 PR Interval:    QRS Duration: 144 QT Interval:  429 QTC Calculation: 460 R Axis:   -13 Text Interpretation: Sinus rhythm Left bundle branch block No significant change since prior 2 days ago Confirmed by Aletta Edouard [phone]) on 05/11/2020 11:19:34 PM   Radiology DG Chest 2 View  Result Date: 05/11/2020 CLINICAL DATA:  Palpitations. EXAM: CHEST - 2 VIEW COMPARISON:  February 28, 2020 FINDINGS: Decreased lung volumes are seen which is likely secondary to the degree of patient inspiration. Stable mild diffuse chronic appearing increased lung markings are noted. There is a small right pleural effusion. No pneumothorax is identified. The heart size and mediastinal contours are within normal limits. Radiopaque surgical clips are seen overlying the expected region of the esophageal hiatus. The visualized skeletal structures are unremarkable.  IMPRESSION: 1. Low lung volumes, likely secondary to the degree of patient inspiration. 2. Small right pleural effusion. Electronically Signed   By: Virgina Norfolk M.D.   On: 05/11/2020 23:14    Procedures Procedures (including critical care time)  CHA2DS2-VASc Score = 4  The patient's score is based upon: CHF History: 0 HTN History: 1 Age : 2 Diabetes History: 0 Stroke History: 0 Vascular Disease History: 0 Gender: 1      ASSESSMENT AND PLAN: Paroxysmal Atrial Fibrillation (ICD10:  I48.0) The patient's CHA2DS2-VASc score is 4, indicating a 4.8% annual risk of stroke.  Secondary Hypercoagulable State (ICD10:  D68.69) The patient is at significant risk for stroke/thromboembolism based upon her CHA2DS2-VASc Score of 4.  Continue Apixaban (Eliquis).    Signed,  Larene Pickett, PA-C    05/12/2020 5:00 AM     Medications Ordered in ED Medications - No data to display  ED Course  I have reviewed the triage vital signs and the nursing notes.  Pertinent labs & imaging results that were available during my care of the patient were reviewed by me and considered in my medical decision making (see chart for details).    MDM Rules/Calculators/A&P  86 year old female presenting to the ED with palpitations. States this began around 9 PM while she was watching TV. States she felt fine prior to this. She did have noted A. fib with EMS, has since self converted to normal sinus rhythm. She reports resolution of her symptoms. She is hemodynamically stable. HR in the 80's, some PVC's noted on monitoring.  Does have 1+ edema of BLE, states this has been ongoing.  She states she was told this may be from her medications.  On chart review-- they offered to switch her from Cardizem to metoprolol due to swelling in office visit 04/05/20, however she declined. This is her second episode of A. fib within the past week. States she is only had one other episode prior to this in July. Will obtain  screening labs. She is already anticoagulated with Eliquis but at reduced dose due to her age and weight. She has never had thyroid studies so we will send those today.  Will monitor.  Labs grossly reassuring no significant electrolyte derangement.  TSH WNL.  Trop 30, suspect this is due to her AFIB.  No chest pain reported previously or currently.  CXR with minimal pleural effusion.  Will plan to delta trop.  Will continue to monitor closely.  3:53 AM Patient has been observed here for several hours, she remains in normal sinus rhythm. She has had no return of palpitations. Labs are grossly reassuring, does have a slight bump in her troponin which is likely secondary to her A. fib. Delta trop without change of 20.  Remains chest pain free.  VSS. Patient's son is at the bedside, states she actually has an appointment on Tuesday 05/14/20 (2 days from now) in AFIB clinic that was arranged a few days ago after her last episode. She may need medication adjustment given recurrent symptomatic episodes of AFIB and worsening LE edema.  Given very close follow-up, will defer medication adjustments to her AFIB providers. Patient and family comfortable with this. She will continue to monitor her symptoms at home, especially for any new chest pain or trouble breathing. They will return here for any new or acute changes.  Discussed with Dr. Rex Kras, agrees with assessment and plan of care.  Final Clinical Impression(s) / ED Diagnoses Final diagnoses:  Paroxysmal A-fib [facility])    Rx / DC Orders ED Discharge Orders    None       Larene Pickett, PA-C 05/12/20 [id number]    Little, Wenda Overland, MD 05/12/20 [phone]

## 2020-05-12 DIAGNOSIS — I480 Paroxysmal atrial fibrillation: Secondary | ICD-10-CM | POA: Diagnosis not present

## 2020-05-12 LAB — TROPONIN I (HIGH SENSITIVITY): Troponin I (High Sensitivity): 40 ng/L — ABNORMAL HIGH (ref <18)

## 2020-05-12 LAB — BRAIN NATRIURETIC PEPTIDE: B Natriuretic Peptide: 637.9 pg/mL — ABNORMAL HIGH (ref 0.0–100.0)

## 2020-05-12 NOTE — Discharge Instructions (Signed)
Follow-up in AFIB clinic on Tuesday as scheduled. Return here for any new/acute changes-- chest pain, SOB, return of AFIB, etc.

## 2020-05-12 NOTE — ED Notes (Signed)
Discharge instructions discussed with pt. Pt verbalized understanding. Pt stable and ambulatory. No signature pad available. 

## 2020-05-14 ENCOUNTER — Encounter (HOSPITAL_COMMUNITY): Payer: Self-pay | Admitting: Physician Assistant

## 2020-05-14 ENCOUNTER — Ambulatory Visit (HOSPITAL_COMMUNITY)
Admission: RE | Admit: 2020-05-14 | Discharge: 2020-05-14 | Disposition: A | Discharge status: Home / Self Care | Payer: Medicare Other | Source: Ambulatory Visit | Attending: Physician Assistant | Admitting: Physician Assistant

## 2020-05-14 ENCOUNTER — Other Ambulatory Visit: Payer: Self-pay

## 2020-05-14 ENCOUNTER — Other Ambulatory Visit (HOSPITAL_COMMUNITY): Payer: Self-pay | Admitting: *Deleted

## 2020-05-14 VITALS — BP 152/60 | HR 66 | Ht 59.0 in | Wt 94.6 lb

## 2020-05-14 DIAGNOSIS — I480 Paroxysmal atrial fibrillation: Secondary | ICD-10-CM | POA: Diagnosis not present

## 2020-05-14 DIAGNOSIS — I447 Left bundle-branch block, unspecified: Secondary | ICD-10-CM | POA: Insufficient documentation

## 2020-05-14 DIAGNOSIS — Z880 Allergy status to penicillin: Secondary | ICD-10-CM | POA: Diagnosis not present

## 2020-05-14 DIAGNOSIS — Z79899 Other long term (current) drug therapy: Secondary | ICD-10-CM | POA: Diagnosis not present

## 2020-05-14 DIAGNOSIS — I119 Hypertensive heart disease without heart failure: Secondary | ICD-10-CM | POA: Insufficient documentation

## 2020-05-14 DIAGNOSIS — D6869 Other thrombophilia: Secondary | ICD-10-CM

## 2020-05-14 DIAGNOSIS — Z7901 Long term (current) use of anticoagulants: Secondary | ICD-10-CM | POA: Diagnosis not present

## 2020-05-14 DIAGNOSIS — Z882 Allergy status to sulfonamides status: Secondary | ICD-10-CM | POA: Insufficient documentation

## 2020-05-14 LAB — T4: T4, Total: 8.7 ug/dL (ref 4.5–12.0)

## 2020-05-14 MED ORDER — AMIODARONE HCL 200 MG PO TABS: 200.0000 mg | ORAL_TABLET | Freq: Every day | ORAL | 0 refills | Status: DC

## 2020-05-14 MED ORDER — AMIODARONE HCL 200 MG PO TABS
200.0000 mg | ORAL_TABLET | Freq: Every day | ORAL | 2 refills | Status: DC
Start: 2020-05-14 — End: 2020-06-06

## 2020-05-14 NOTE — Patient Instructions (Signed)
Start Amiodarone 200mg  once a day WITH FOOD

## 2020-05-14 NOTE — Progress Notes (Signed)
Primary Care Physician: Leanna Battles, MD Primary Cardiologist: Dr Marlou Porch (new) Primary Electrophysiologist: none Referring Physician: [facility]   Dawn Hampton is a 86 y.o. female with a history of HTN and new onset atrial fibrillation who presents for follow up in the [facility]. The patient was initially diagnosed with atrial fibrillation on 02/28/20 after presenting to the ED with symptoms of palpitations, lightheadedness, and a "shaky" feeling. ECG showed rapid afib HR 190s. She was given diltiazem and converted in the ED. She was also diagnosed with a UTI in the ED and started on antibiotics. She was discharged on diltiazem. She denies significant snoring or alcohol use. She denies ever having palpitations before. Patient is on Eliquis for a CHADS2VASC score of 4.  On follow up today, patient has been to ED twice on 05/09/20 and 05/11/20 with brief but very symptomatic afib. She converted to SR both times without intervention. There were no specific triggers that she could identify. She denies any bleeding issues on anticoagulation.   Today, she denies symptoms of shortness of breath, orthopnea, PND, dizziness, presyncope, syncope, snoring, daytime somnolence, bleeding, or neurologic sequela. The patient is tolerating medications without difficulties and is otherwise without complaint today.    Atrial Fibrillation Risk Factors:  she does not have symptoms or diagnosis of sleep apnea. she does not have a history of rheumatic fever. she does not have a history of alcohol use. The patient does not have a history of early familial atrial fibrillation or other arrhythmias.  she has a BMI of Body mass index is 19.11 kg/m.Marland Kitchen Filed Weights   05/14/20 1001  Weight: 42.9 kg    No family history on file.   Atrial Fibrillation Management history:  Previous antiarrhythmic drugs: none Previous cardioversions: none Previous ablations: none CHADS2VASC  score: 4 Anticoagulation history: Eliquis   Past Medical History:  Diagnosis Date  . Abdominal mass, right lower quadrant   . Anemia   . Hypertension   . Macular degeneration   . Osteoporosis   . Shingles    T8   Past Surgical History:  Procedure Laterality Date  . cataracts  2004  . PUD, partial gastrectomy 1992    . TOTAL ABDOMINAL HYSTERECTOMY W/ BILATERAL SALPINGOOPHORECTOMY  1975    Current Outpatient Medications  Medication Sig Dispense Refill  . acetaminophen (TYLENOL) 650 MG CR tablet Take 1,300 mg by mouth every morning.    . beta carotene w/minerals (OCUVITE) tablet Take 1 tablet by mouth daily.    . calcium carbonate (TUMS - DOSED IN MG ELEMENTAL CALCIUM) 500 MG chewable tablet Chew 1 tablet by mouth 2 (two) times daily.     Marland Kitchen diltiazem (CARDIZEM SR) 60 MG 12 hr capsule Take 1 capsule (60 mg total) by mouth 2 (two) times daily. 60 capsule 2  . ELIQUIS 2.5 MG TABS tablet TAKE 1 TABLET BY MOUTH TWICE A DAY 60 tablet 1  . iron polysaccharides (NIFEREX) 150 MG capsule Take 150 mg by mouth daily.     Marland Kitchen latanoprost (XALATAN) 0.005 % ophthalmic solution Place 1 drop into both eyes at bedtime.     Marland Kitchen loperamide (IMODIUM A-D) 2 MG tablet Take 2 mg by mouth as needed for diarrhea or loose stools.     Marland Kitchen loratadine (CLARITIN) 10 MG tablet Take 10 mg by mouth daily as needed for allergies.     Marland Kitchen losartan (COZAAR) 100 MG tablet Take 100 mg by mouth daily.    Marland Kitchen  Polyethyl Glycol-Propyl Glycol (SYSTANE OP) Apply 1 drop to eye 2 (two) times daily.    . Vitamin D, Ergocalciferol, (DRISDOL) 50000 UNITS CAPS capsule Take 50,000 Units by mouth every 7 (seven) days. Mondays    . amiodarone (PACERONE) 200 MG tablet Take 1 tablet (200 mg total) by mouth daily. 30 tablet 0   No current facility-administered medications for this encounter.    Allergies  Allergen Reactions  . Bacitracin-Polymyxin B Anaphylaxis  . Sulfa Antibiotics Itching  . Penicillins Swelling and Rash    Swelling  located at injection site    Social History   Socioeconomic History  . Marital status: Widowed    Spouse name: Not on file  . Number of children: Not on file  . Years of education: Not on file  . Highest education level: Not on file  Occupational History  . Not on file  Tobacco Use  . Smoking status: Never Smoker  . Smokeless tobacco: Never Used  Substance and Sexual Activity  . Alcohol use: No  . Drug use: Never  . Sexual activity: Not on file  Other Topics Concern  . Not on file  Social History Narrative  . Not on file   Social Determinants of Health   Financial Resource Strain:   . Difficulty of Paying Living Expenses: Not on file  Food Insecurity:   . Worried About Charity fundraiser in the Last Year: Not on file  . Ran Out of Food in the Last Year: Not on file  Transportation Needs:   . Lack of Transportation (Medical): Not on file  . Lack of Transportation (Non-Medical): Not on file  Physical Activity:   . Days of Exercise per Week: Not on file  . Minutes of Exercise per Session: Not on file  Stress:   . Feeling of Stress : Not on file  Social Connections:   . Frequency of Communication with Friends and Family: Not on file  . Frequency of Social Gatherings with Friends and Family: Not on file  . Attends Religious Services: Not on file  . Active Member of Clubs or Organizations: Not on file  . Attends Archivist Meetings: Not on file  . Marital Status: Not on file  Intimate Partner Violence:   . Fear of Current or Ex-Partner: Not on file  . Emotionally Abused: Not on file  . Physically Abused: Not on file  . Sexually Abused: Not on file     ROS- All systems are reviewed and negative except as per the HPI above.  Physical Exam: Vitals:   05/14/20 1001  BP: (!) 152/60  Pulse: 66  Weight: 42.9 kg  Height: 4\' 11"  (1.499 m)    GEN- The patient is well appearing elderly female, alert and oriented x 3 today.   HEENT-head normocephalic,  atraumatic, sclera clear, conjunctiva pink, hearing intact, trachea midline. Lungs- Clear to ausculation bilaterally, normal work of breathing Heart- Regular rate and rhythm, no murmurs, rubs or gallops  GI- soft, NT, ND, + BS Extremities- no clubbing, cyanosis. 1+ pedal edema MS- no significant deformity or atrophy Skin- no rash or lesion Psych- euthymic mood, full affect Neuro- strength and sensation are intact   Wt Readings from Last 3 Encounters:  05/14/20 42.9 kg  05/09/20 44.5 kg  04/01/20 44.5 kg    EKG today demonstrates SR HR 66, PVC, LBBB, PR 140, QRS 136, QTc 452  Echo 04/01/20 demonstrated 1. Left ventricular ejection fraction, by estimation, is 55 to 60%.  The  left ventricle has normal function. The left ventricle has no regional  wall motion abnormalities. Left ventricular diastolic parameters are  consistent with Grade I diastolic  dysfunction (impaired relaxation).  2. Right ventricular systolic function is normal. The right ventricular  size is normal. There is moderately elevated pulmonary artery systolic  pressure.  3. Left atrial size was severely dilated.  4. Right atrial size was moderately dilated.  5. The mitral valve is normal in structure. Moderate mitral valve  regurgitation. No evidence of mitral stenosis.  6. Tricuspid valve regurgitation is moderate.  7. The aortic valve is normal in structure. Aortic valve regurgitation is  mild. Mild aortic valve sclerosis is present, with no evidence of aortic  valve stenosis.  8. The inferior vena cava is dilated in size with <50% respiratory  variability, suggesting right atrial pressure of 15 mmHg.   Epic records are reviewed at length today  CHA2DS2-VASc Score = 4  The patient's score is based upon: CHF History: 0 HTN History: 1 Age : 2 Diabetes History: 0 Stroke History: 0 Vascular Disease History: 0 Gender: 1      ASSESSMENT AND PLAN: 1. Paroxysmal Atrial Fibrillation (ICD10:   I48.0) The patient's CHA2DS2-VASc score is 4, indicating a 4.8% annual risk of stroke.   Patient having frequent symptoms of rapid afib.  We discussed therapeutic options today. Will plan to start amiodarone 200 mg daily, loading slowly. Recent cmet/TSH reviewed.  Continue diltiazem 60 mg BID Continue Eliquis 2.5 mg BID (age, weight).  2. Secondary Hypercoagulable State (ICD10:  D68.69) The patient is at significant risk for stroke/thromboembolism based upon her CHA2DS2-VASc Score of 4.  Continue Apixaban (Eliquis).   3. HTN Stable, no changes today.  4. Valvular heart disease Mod MR, mild AR.   Follow up with Dr Marlou Porch and AF clinic as scheduled.    [facility] [address] [place],  [address] [phone] 05/14/2020 10:28 AM

## 2020-05-23 DIAGNOSIS — Z23 Encounter for immunization: Secondary | ICD-10-CM | POA: Diagnosis not present

## 2020-05-28 DIAGNOSIS — H26493 Other secondary cataract, bilateral: Secondary | ICD-10-CM | POA: Diagnosis not present

## 2020-05-28 DIAGNOSIS — H353134 Nonexudative age-related macular degeneration, bilateral, advanced atrophic with subfoveal involvement: Secondary | ICD-10-CM | POA: Diagnosis not present

## 2020-05-28 DIAGNOSIS — H524 Presbyopia: Secondary | ICD-10-CM | POA: Diagnosis not present

## 2020-05-28 DIAGNOSIS — H401134 Primary open-angle glaucoma, bilateral, indeterminate stage: Secondary | ICD-10-CM | POA: Diagnosis not present

## 2020-05-30 ENCOUNTER — Other Ambulatory Visit (HOSPITAL_COMMUNITY): Payer: Self-pay | Admitting: *Deleted

## 2020-05-30 MED ORDER — DILTIAZEM HCL ER 60 MG PO CP12: 60.0000 mg | ORAL_CAPSULE | Freq: Two times a day (BID) | ORAL | 2 refills | Status: DC

## 2020-05-31 ENCOUNTER — Ambulatory Visit (INDEPENDENT_AMBULATORY_CARE_PROVIDER_SITE_OTHER): Payer: Medicare Other | Admitting: Cardiology

## 2020-05-31 ENCOUNTER — Encounter: Payer: Self-pay | Admitting: Cardiology

## 2020-05-31 ENCOUNTER — Other Ambulatory Visit: Payer: Self-pay

## 2020-05-31 VITALS — BP 150/60 | HR 64 | Ht 59.0 in | Wt 96.0 lb

## 2020-05-31 DIAGNOSIS — I480 Paroxysmal atrial fibrillation: Secondary | ICD-10-CM | POA: Diagnosis not present

## 2020-05-31 DIAGNOSIS — R221 Localized swelling, mass and lump, neck: Secondary | ICD-10-CM

## 2020-05-31 NOTE — Progress Notes (Signed)
Cardiology Office Note:    Date:  05/31/2020   ID:  Martin Lake, DOB 01/28/1922, MRN [id number]  PCP:  Leanna Battles, MD  [facility] Cardiologist:  Candee Furbish, MD  [facility] Electrophysiologist:  None   Referring MD: Oliver Barre, PA     History of Present Illness:    Dawn Hampton [name] is a 86 y.o. female here to establish with cardiology for atrial fibrillation.  Was placed on amiodarone 200 mg daily.  Currently in sinus rhythm.  Excellent.  She is on Eliquis 2.5 mg twice a day.  She has been noticing some lower extremity edema right greater than left.  She is on diltiazem 60 mg twice a day.  Left neck mass also relayed.  Thyroid functions have been tested per her by Dr. Sharlett Iles and were normal.  She has noticed this for the past 3 months.  She sometimes has some trouble swallowing.  Last TSH 1.5.  Past Medical History:  Diagnosis Date  . Abdominal mass, right lower quadrant   . Anemia   . Hypertension   . Macular degeneration   . Osteoporosis   . Shingles    T8    Past Surgical History:  Procedure Laterality Date  . cataracts  2004  . PUD, partial gastrectomy 1992    . TOTAL ABDOMINAL HYSTERECTOMY W/ BILATERAL SALPINGOOPHORECTOMY  1975    Current Medications: Current Meds  Medication Sig  . acetaminophen (TYLENOL) 650 MG CR tablet Take 1,300 mg by mouth every morning.  Marland Kitchen amiodarone (PACERONE) 200 MG tablet Take 1 tablet (200 mg total) by mouth daily.  . beta carotene w/minerals (OCUVITE) tablet Take 1 tablet by mouth daily.  . calcium carbonate (TUMS - DOSED IN MG ELEMENTAL CALCIUM) 500 MG chewable tablet Chew 1 tablet by mouth 2 (two) times daily.   Marland Kitchen ELIQUIS 2.5 MG TABS tablet TAKE 1 TABLET BY MOUTH TWICE A DAY  . iron polysaccharides (NIFEREX) 150 MG capsule Take 150 mg by mouth daily.   Marland Kitchen latanoprost (XALATAN) 0.005 % ophthalmic solution Place 1 drop into both eyes at bedtime.   Marland Kitchen loperamide (IMODIUM A-D) 2 MG tablet Take 2 mg by  mouth as needed for diarrhea or loose stools.   Marland Kitchen loratadine (CLARITIN) 10 MG tablet Take 10 mg by mouth daily as needed for allergies.   Marland Kitchen losartan (COZAAR) 100 MG tablet Take 100 mg by mouth daily.  Vladimir Faster Glycol-Propyl Glycol (SYSTANE OP) Apply 1 drop to eye 2 (two) times daily.  . Vitamin D, Ergocalciferol, (DRISDOL) 50000 UNITS CAPS capsule Take 50,000 Units by mouth every 7 (seven) days. Mondays  . [DISCONTINUED] diltiazem (CARDIZEM SR) 60 MG 12 hr capsule Take 1 capsule (60 mg total) by mouth 2 (two) times daily.     Allergies:   Bacitracin-polymyxin b, Sulfa antibiotics, and Penicillins   Social History   Socioeconomic History  . Marital status: Widowed    Spouse name: Not on file  . Number of children: Not on file  . Years of education: Not on file  . Highest education level: Not on file  Occupational History  . Not on file  Tobacco Use  . Smoking status: Never Smoker  . Smokeless tobacco: Never Used  Substance and Sexual Activity  . Alcohol use: No  . Drug use: Never  . Sexual activity: Not on file  Other Topics Concern  . Not on file  Social History Narrative  . Not on file   Social Determinants of  Health   Financial Resource Strain:   . Difficulty of Paying Living Expenses: Not on file  Food Insecurity:   . Worried About Charity fundraiser in the Last Year: Not on file  . Ran Out of Food in the Last Year: Not on file  Transportation Needs:   . Lack of Transportation (Medical): Not on file  . Lack of Transportation (Non-Medical): Not on file  Physical Activity:   . Days of Exercise per Week: Not on file  . Minutes of Exercise per Session: Not on file  Stress:   . Feeling of Stress : Not on file  Social Connections:   . Frequency of Communication with Friends and Family: Not on file  . Frequency of Social Gatherings with Friends and Family: Not on file  . Attends Religious Services: Not on file  . Active Member of Clubs or Organizations: Not on file    . Attends Archivist Meetings: Not on file  . Marital Status: Not on file     Family History:  No early family history of CAD ROS:   Please see the history of present illness.    Weakness, tired, edema, no bleeding, no chest pain no shortness of breath all other systems reviewed and are negative.  EKGs/Labs/Other Studies Reviewed:     ECHO 04/01/20: 1. Left ventricular ejection fraction, by estimation, is 55 to 60%. The  left ventricle has normal function. The left ventricle has no regional  wall motion abnormalities. Left ventricular diastolic parameters are  consistent with Grade I diastolic  dysfunction (impaired relaxation).  2. Right ventricular systolic function is normal. The right ventricular  size is normal. There is moderately elevated pulmonary artery systolic  pressure.  3. Left atrial size was severely dilated.  4. Right atrial size was moderately dilated.  5. The mitral valve is normal in structure. Moderate mitral valve  regurgitation. No evidence of mitral stenosis.  6. Tricuspid valve regurgitation is moderate.  7. The aortic valve is normal in structure. Aortic valve regurgitation is  mild. Mild aortic valve sclerosis is present, with no evidence of aortic  valve stenosis.  8. The inferior vena cava is dilated in size with <50% respiratory  variability, suggesting right atrial pressure of 15 mmHg.   EKG:  EKG is  ordered today.  The ekg ordered today demonstrates sinus rhythm 61 left bundle branch block QRS duration 148 ms.  PVC.  Recent Labs: 05/11/2020: ALT 24; B Natriuretic Peptide 637.9; BUN 17; Creatinine, Ser 0.66; Hemoglobin 9.9; Magnesium 2.2; Platelets 142; Potassium 3.8; Sodium 139; TSH 1.550  Recent Lipid Panel No results found for: CHOL, TRIG, HDL, CHOLHDL, VLDL, LDLCALC, LDLDIRECT  Physical Exam:    VS:  BP (!) 150/60   Pulse 64   Ht 4\' 11"  (1.499 m)   Wt 96 lb (43.5 kg)   SpO2 95%   BMI 19.39 kg/m     Wt Readings from  Last 3 Encounters:  05/31/20 96 lb (43.5 kg)  05/14/20 94 lb 9.6 oz (42.9 kg)  05/09/20 98 lb 1.7 oz (44.5 kg)     GEN:  Well nourished, well developed in no acute distress HEENT: Approximately 2 cm left-sided neck mass, moves with swallowing NECK: No JVD; No carotid bruits LYMPHATICS: No lymphadenopathy CARDIAC: RRR, 2/6 SM, no rubs, gallops RESPIRATORY:  Clear to auscultation without rales, wheezing or rhonchi  ABDOMEN: Soft, non-tender, non-distended MUSCULOSKELETAL:  No edema; No deformity  SKIN: Warm and dry NEUROLOGIC:  Alert and  oriented x 3 PSYCHIATRIC:  Normal affect   ASSESSMENT:    1. Paroxysmal atrial fibrillation (HCC)   2. Mass of lateral neck    PLAN:    In order of problems listed above:  Left-sided neck mass -Appreciate discussion with Dr. Sharlett Iles over the phone.  We will go ahead and check a thyroid ultrasound.   -TSH normal.  Left bundle branch block -Monitor for any signs of syncope.  Currently on amiodarone 200 mg a day with recent atrial fibrillation diagnosis.  Paroxysmal atrial fibrillation -Currently in sinus rhythm with heart rate of 61 bpm.  This is on amiodarone 200 mg a day.  She has also been taking diltiazem 60 mg twice a day.  Given her lower extremity edema and now conversion over to sinus rhythm, I will go ahead and discontinue her diltiazem which should help with her lower extremity edema.  Chronic anticoagulation -Continue with Eliquis dose adjusted for age.  Hemoglobin 9.9 creatinine 0.6 weight 44 kg  Essential hypertension -Continue with losartan 100 mg a day.  Valvular heart disease -Has both moderate mitral regurgitation and mild aortic regurgitation.  We will continue to monitor clinically.  We will let Dr. Sharlett Iles know the results of her thyroid ultrasound.  Medication Adjustments/Labs and Tests Ordered: Current medicines are reviewed at length with the patient today.  Concerns regarding medicines are outlined above.   Orders Placed This Encounter  Procedures  . US THYROID  . EKG 12-Lead   No orders of the defined types were placed in this encounter.   Patient Instructions  Medication Instructions:  Please discontinue your Diltiazem. Continue all other medications as listed.  *If you need a refill on your cardiac medications before your next appointment, please call your pharmacy*  Testing/Procedures: You have been ordered to have an ultrasound of your thyroid.  This will be completed at [facility] in the Radiology Department.  There are no special instructions prior to this test.  Follow-Up: At [facility], you and your health needs are our priority.  As part of our continuing mission to provide you with exceptional heart care, we have created designated Provider Care Teams.  These Care Teams include your primary Cardiologist (physician) and Advanced Practice Providers (APPs -  Physician Assistants and Nurse Practitioners) who all work together to provide you with the care you need, when you need it.  We recommend signing up for the patient portal called "MyChart".  Sign up information is provided on this After Visit Summary.  MyChart is used to connect with patients for Virtual Visits (Telemedicine).  Patients are able to view lab/test results, encounter notes, upcoming appointments, etc.  Non-urgent messages can be sent to your provider as well.   To learn more about what you can do with MyChart, go to [email or web address].    Your next appointment:   4 month(s)  The format for your next appointment:   In Person  Provider:   Candee Furbish, MD   Thank you for choosing [facility]!!        Signed, Candee Furbish, MD  05/31/2020 3:16 PM    [place]

## 2020-05-31 NOTE — Patient Instructions (Signed)
Medication Instructions:  Please discontinue your Diltiazem. Continue all other medications as listed.  *If you need a refill on your cardiac medications before your next appointment, please call your pharmacy*  Testing/Procedures: You have been ordered to have an ultrasound of your thyroid.  This will be completed at [facility] in the Radiology Department.  There are no special instructions prior to this test.  Follow-Up: At [facility], you and your health needs are our priority.  As part of our continuing mission to provide you with exceptional heart care, we have created designated Provider Care Teams.  These Care Teams include your primary Cardiologist (physician) and Advanced Practice Providers (APPs -  Physician Assistants and Nurse Practitioners) who all work together to provide you with the care you need, when you need it.  We recommend signing up for the patient portal called "MyChart".  Sign up information is provided on this After Visit Summary.  MyChart is used to connect with patients for Virtual Visits (Telemedicine).  Patients are able to view lab/test results, encounter notes, upcoming appointments, etc.  Non-urgent messages can be sent to your provider as well.   To learn more about what you can do with MyChart, go to [email or web address].    Your next appointment:   4 month(s)  The format for your next appointment:   In Person  Provider:   Candee Furbish, MD   Thank you for choosing [facility]!!

## 2020-06-03 ENCOUNTER — Other Ambulatory Visit: Payer: Self-pay

## 2020-06-03 ENCOUNTER — Encounter (INDEPENDENT_AMBULATORY_CARE_PROVIDER_SITE_OTHER): Payer: Self-pay | Admitting: Ophthalmology

## 2020-06-03 ENCOUNTER — Ambulatory Visit (INDEPENDENT_AMBULATORY_CARE_PROVIDER_SITE_OTHER): Payer: Medicare Other | Admitting: Ophthalmology

## 2020-06-03 DIAGNOSIS — H353221 Exudative age-related macular degeneration, left eye, with active choroidal neovascularization: Secondary | ICD-10-CM

## 2020-06-03 DIAGNOSIS — H353123 Nonexudative age-related macular degeneration, left eye, advanced atrophic without subfoveal involvement: Secondary | ICD-10-CM

## 2020-06-03 DIAGNOSIS — H353114 Nonexudative age-related macular degeneration, right eye, advanced atrophic with subfoveal involvement: Secondary | ICD-10-CM | POA: Diagnosis not present

## 2020-06-03 MED ORDER — RANIBIZUMAB 0.5 MG/0.05ML IZ SOLN FOR KALEIDOSCOPE
0.5000 mg | INTRAVITREAL | Status: AC | PRN
  Administered 2020-06-03: .5 mg via INTRAVITREAL

## 2020-06-03 NOTE — Addendum Note (Signed)
Addended by: Deloria Lair A on: 06/03/2020 11:37 AM   Modules accepted: Level of Service

## 2020-06-03 NOTE — Assessment & Plan Note (Signed)
OS with chronic area of activity temporal to the fovea with intraretinal fluid left eye yet no active disease threatening the fovea.  Area of CME is in an overlying a geographic atrophy and appears to be chronic and stable over time

## 2020-06-03 NOTE — Progress Notes (Signed)
06/03/2020     CHIEF COMPLAINT Patient presents for Retina Follow Up   HISTORY OF PRESENT ILLNESS: Dawn Hampton is a 86 y.o. female who presents to the clinic today for:   HPI    Retina Follow Up    Patient presents with  Wet AMD.  In both eyes.  This started 8 weeks ago.  Severity is mild.  Duration of 8 weeks.  Since onset it is stable.          Comments    8 Week AMD F/U OU, poss Lucentis 0.5 OS  Pt sts [facility] may have changed since last visit OU. Pt sts she was dx with a-fib since last appt and in ED 3 times.        Last edited by Rockie Neighbours, [place] on 06/03/2020 10:33 AM. (History)      Referring physician: Leanna Battles, MD [address],  [place] [address]  HISTORICAL INFORMATION:   Selected notes from the MEDICAL RECORD NUMBER       CURRENT MEDICATIONS: Current Outpatient Medications (Ophthalmic Drugs)  Medication Sig  . latanoprost (XALATAN) 0.005 % ophthalmic solution Place 1 drop into both eyes at bedtime.   Vladimir Faster Glycol-Propyl Glycol (SYSTANE OP) Apply 1 drop to eye 2 (two) times daily.   No current facility-administered medications for this visit. (Ophthalmic Drugs)   Current Outpatient Medications (Other)  Medication Sig  . acetaminophen (TYLENOL) 650 MG CR tablet Take 1,300 mg by mouth every morning.  Marland Kitchen amiodarone (PACERONE) 200 MG tablet Take 1 tablet (200 mg total) by mouth daily.  . beta carotene w/minerals (OCUVITE) tablet Take 1 tablet by mouth daily.  . calcium carbonate (TUMS - DOSED IN MG ELEMENTAL CALCIUM) 500 MG chewable tablet Chew 1 tablet by mouth 2 (two) times daily.   Marland Kitchen ELIQUIS 2.5 MG TABS tablet TAKE 1 TABLET BY MOUTH TWICE A DAY  . iron polysaccharides (NIFEREX) 150 MG capsule Take 150 mg by mouth daily.   Marland Kitchen loperamide (IMODIUM A-D) 2 MG tablet Take 2 mg by mouth as needed for diarrhea or loose stools.   Marland Kitchen loratadine (CLARITIN) 10 MG tablet Take 10 mg by mouth daily as needed for allergies.   Marland Kitchen losartan (COZAAR)  100 MG tablet Take 100 mg by mouth daily.  . Vitamin D, Ergocalciferol, (DRISDOL) 50000 UNITS CAPS capsule Take 50,000 Units by mouth every 7 (seven) days. Mondays   No current facility-administered medications for this visit. (Other)      REVIEW OF SYSTEMS:    ALLERGIES Allergies  Allergen Reactions  . Bacitracin-Polymyxin B Anaphylaxis  . Sulfa Antibiotics Itching  . Penicillins Swelling and Rash    Swelling located at injection site    PAST MEDICAL HISTORY Past Medical History:  Diagnosis Date  . Abdominal mass, right lower quadrant   . Anemia   . Hypertension   . Macular degeneration   . Osteoporosis   . Shingles    T8   Past Surgical History:  Procedure Laterality Date  . cataracts  2004  . PUD, partial gastrectomy 1992    . TOTAL ABDOMINAL HYSTERECTOMY W/ BILATERAL SALPINGOOPHORECTOMY  1975    FAMILY HISTORY History reviewed. No pertinent family history.  SOCIAL HISTORY Social History   Tobacco Use  . Smoking status: Never Smoker  . Smokeless tobacco: Never Used  Substance Use Topics  . Alcohol use: No  . Drug use: Never         OPHTHALMIC EXAM:  Base Eye  Exam    Visual Acuity (ETDRS)      Right Left   Dist cc 20/50 +1 20/50 -2   Dist ph cc NI 20/40 -2   Correction: Glasses       Tonometry (Tonopen, 10:34 AM)      Right Left   Pressure 19 19       Pupils      Pupils Dark Light Shape React APD   Right PERRL 3 2 Round Minimal None   Left PERRL 3 2 Round Minimal None       Visual Fields (Counting fingers)      Left Right    Full Full       Extraocular Movement      Right Left    Full Full       Neuro/Psych    Oriented x3: Yes   Mood/Affect: Normal       Dilation    Both eyes: 1.0% Mydriacyl, 2.5% Phenylephrine @ 10:38 AM        Slit Lamp and Fundus Exam    External Exam      Right Left   External Normal Normal       Slit Lamp Exam      Right Left   Lids/Lashes Normal Normal   Conjunctiva/Sclera White and  quiet White and quiet   Cornea Clear Clear   Anterior Chamber Deep and quiet Deep and quiet   Iris Round and reactive Round and reactive   Lens Posterior chamber intraocular lens Posterior chamber intraocular lens   Anterior Vitreous Normal Normal       Fundus Exam      Right Left   Posterior Vitreous Posterior vitreous detachment Posterior vitreous detachment   Disc Peripapillary atrophy Peripapillary atrophy   C/D Ratio 0.65 0.65   Macula Geographic atrophy Geographic atrophy, Advanced age related macular degeneration, Hard drusen, no exudates, no hemorrhage, no macular thickening, Subretinal neovascular membrane, Retinal pigment epithelial mottling   Vessels Normal Normal   Periphery Normal Normal          IMAGING AND PROCEDURES  Imaging and Procedures for 06/03/20  OCT, Retina - OU - Both Eyes       Right Eye Quality was borderline. Scan locations included subfoveal. Central Foveal Thickness: 242. Findings include abnormal foveal contour, no SRF, no IRF, subretinal scarring.   Left Eye Quality was good. Scan locations included subfoveal. Central Foveal Thickness: 261. Progression has been stable. Findings include intraretinal fluid, cystoid macular edema, abnormal foveal contour.        Intravitreal Injection, Pharmacologic Agent - OS - Left Eye       Time Out 06/03/2020. 11:35 AM. Confirmed correct patient, procedure, site, and patient consented.   Anesthesia Topical anesthesia was used. Anesthetic medications included Akten 3.5%.   Procedure Preparation included Tobramycin 0.3%, 5% betadine to ocular surface, 10% betadine to eyelids. A 30 gauge needle was used.   Injection:  0.5 mg ranibizumab (LUCENTIS) 0.5 MG/0.05ML intravitreal injection   NDC: 50242-080-01, Lot: [id number]   Route: Intravitreal, Site: Left Eye, Waste: 0 mg  Post-op Post injection exam found visual acuity of at least counting fingers. The patient tolerated the procedure well. There were no  complications. The patient received written and verbal post procedure care education. Post injection medications were not given.                 ASSESSMENT/PLAN:  Exudative age-related macular degeneration of left eye with active choroidal neovascularization (HCC) OS  with chronic area of activity temporal to the fovea with intraretinal fluid left eye yet no active disease threatening the fovea.  Area of CME is in an overlying a geographic atrophy and appears to be chronic and stable over time  Advanced nonexudative age-related macular degeneration of right eye with subfoveal involvement Accounts for acuity centrally OD      ICD-10-CM   1. Exudative age-related macular degeneration of left eye with active choroidal neovascularization (HCC)  H35.3221 OCT, Retina - OU - Both Eyes    Intravitreal Injection, Pharmacologic Agent - OS - Left Eye    ranibizumab (LUCENTIS) 0.5 MG/0.05ML intravitreal injection 0.5 mg  2. Advanced nonexudative age-related macular degeneration of left eye without subfoveal involvement  H35.3123   3. Advanced nonexudative age-related macular degeneration of right eye with subfoveal involvement  H35.3114     1.  OS, chronic activity temporally, stable on Lucentis injection intravitreal at 8-week interval, repeat today and examination in 8 weeks  2.  OD, follow-up as scheduled typically on a 61-month interval currently  3.  Ophthalmic Meds Ordered this visit:  Meds ordered this encounter  Medications  . ranibizumab (LUCENTIS) 0.5 MG/0.05ML intravitreal injection 0.5 mg       Return in about 8 weeks (around 07/29/2020) for dilate, OS, LUCENTIS 0.5 OCT.  There are no Patient Instructions on file for this visit.   Explained the diagnoses, plan, and follow up with the patient and they expressed understanding.  Patient expressed understanding of the importance of proper follow up care.   Clent Demark Doyel Mulkern M.D. Diseases & Surgery of the Retina and  Vitreous Retina & Diabetic [place] 06/03/20     Abbreviations: M myopia (nearsighted); A astigmatism; H hyperopia (farsighted); P presbyopia; Mrx spectacle prescription;  CTL contact lenses; OD right eye; OS left eye; OU both eyes  XT exotropia; ET esotropia; PEK punctate epithelial keratitis; PEE punctate epithelial erosions; DES dry eye syndrome; MGD meibomian gland dysfunction; ATs artificial tears; PFAT's preservative free artificial tears; [place] nuclear sclerotic cataract; PSC posterior subcapsular cataract; ERM epi-retinal membrane; PVD posterior vitreous detachment; RD retinal detachment; DM diabetes mellitus; DR diabetic retinopathy; NPDR non-proliferative diabetic retinopathy; PDR proliferative diabetic retinopathy; CSME clinically significant macular edema; DME diabetic macular edema; dbh dot blot hemorrhages; CWS cotton wool spot; POAG primary open angle glaucoma; C/D cup-to-disc ratio; HVF humphrey visual field; GVF goldmann visual field; OCT optical coherence tomography; IOP intraocular pressure; BRVO Branch retinal vein occlusion; CRVO central retinal vein occlusion; CRAO central retinal artery occlusion; BRAO branch retinal artery occlusion; RT retinal tear; SB scleral buckle; PPV pars plana vitrectomy; VH Vitreous hemorrhage; PRP panretinal laser photocoagulation; IVK intravitreal kenalog; VMT vitreomacular traction; MH Macular hole;  NVD neovascularization of the disc; NVE neovascularization elsewhere; AREDS age related eye disease study; ARMD age related macular degeneration; POAG primary open angle glaucoma; EBMD epithelial/anterior basement membrane dystrophy; ACIOL anterior chamber intraocular lens; IOL intraocular lens; PCIOL posterior chamber intraocular lens; Phaco/IOL phacoemulsification with intraocular lens placement; [place] photorefractive keratectomy; LASIK laser assisted in situ keratomileusis; HTN hypertension; DM diabetes mellitus; COPD chronic obstructive pulmonary disease

## 2020-06-03 NOTE — Assessment & Plan Note (Signed)
Accounts for acuity centrally OD

## 2020-06-06 ENCOUNTER — Other Ambulatory Visit (HOSPITAL_COMMUNITY): Payer: Self-pay | Admitting: *Deleted

## 2020-06-06 ENCOUNTER — Other Ambulatory Visit (HOSPITAL_COMMUNITY): Payer: Self-pay | Admitting: Physician Assistant

## 2020-06-07 ENCOUNTER — Encounter (HOSPITAL_COMMUNITY)
Admission: RE | Admit: 2020-06-07 | Discharge: 2020-06-07 | Disposition: A | Discharge status: Home / Self Care | Payer: Medicare Other | Source: Ambulatory Visit | Attending: Internal Medicine | Admitting: Internal Medicine

## 2020-06-07 ENCOUNTER — Other Ambulatory Visit: Payer: Self-pay

## 2020-06-07 ENCOUNTER — Ambulatory Visit (HOSPITAL_COMMUNITY)
Admission: RE | Admit: 2020-06-07 | Discharge: 2020-06-07 | Disposition: A | Discharge status: Home / Self Care | Payer: Medicare Other | Source: Ambulatory Visit | Attending: Cardiology | Admitting: Cardiology

## 2020-06-07 DIAGNOSIS — M810 Age-related osteoporosis without current pathological fracture: Secondary | ICD-10-CM | POA: Insufficient documentation

## 2020-06-07 DIAGNOSIS — E041 Nontoxic single thyroid nodule: Secondary | ICD-10-CM | POA: Diagnosis not present

## 2020-06-07 DIAGNOSIS — R221 Localized swelling, mass and lump, neck: Secondary | ICD-10-CM | POA: Diagnosis not present

## 2020-06-07 MED ORDER — DENOSUMAB 60 MG/ML ~~LOC~~ SOSY
PREFILLED_SYRINGE | SUBCUTANEOUS | Status: AC
  Filled 2020-06-07: qty 1

## 2020-06-07 MED ORDER — DENOSUMAB 60 MG/ML ~~LOC~~ SOSY
60.0000 mg | PREFILLED_SYRINGE | Freq: Once | SUBCUTANEOUS | Status: AC
  Administered 2020-06-07: 60 mg via SUBCUTANEOUS

## 2020-06-11 ENCOUNTER — Telehealth: Payer: Self-pay | Admitting: Cardiology

## 2020-06-11 ENCOUNTER — Encounter (INDEPENDENT_AMBULATORY_CARE_PROVIDER_SITE_OTHER): Payer: Medicare Other | Admitting: Ophthalmology

## 2020-06-11 NOTE — Telephone Encounter (Signed)
Markedly heterogeneous and asymmetrically enlarged thyroid gland as  detailed above. The lateral neck mass felt on physical exam may be  explained by the enlarged left hemi thyroid. The left hemi thyroid  is heterogeneous, however a distinct thyroid nodule is difficult to  appreciate.   Will forward to Dr. Sharlett Iles for review as well.   Dawn Furbish, MD    Pt aware of results and that they have been routed to her PCP for further review and f/u.

## 2020-06-11 NOTE — Telephone Encounter (Signed)
Patient returned call for her U/S results.

## 2020-06-21 DIAGNOSIS — Z7901 Long term (current) use of anticoagulants: Secondary | ICD-10-CM | POA: Diagnosis not present

## 2020-06-21 DIAGNOSIS — R221 Localized swelling, mass and lump, neck: Secondary | ICD-10-CM | POA: Diagnosis not present

## 2020-06-21 DIAGNOSIS — I4891 Unspecified atrial fibrillation: Secondary | ICD-10-CM | POA: Diagnosis not present

## 2020-06-21 DIAGNOSIS — K4090 Unilateral inguinal hernia, without obstruction or gangrene, not specified as recurrent: Secondary | ICD-10-CM | POA: Diagnosis not present

## 2020-06-24 ENCOUNTER — Other Ambulatory Visit: Payer: Self-pay

## 2020-06-24 ENCOUNTER — Other Ambulatory Visit: Payer: Self-pay | Admitting: Internal Medicine

## 2020-06-24 ENCOUNTER — Other Ambulatory Visit (HOSPITAL_COMMUNITY): Payer: Self-pay | Admitting: Internal Medicine

## 2020-06-24 ENCOUNTER — Ambulatory Visit (HOSPITAL_COMMUNITY)
Admission: RE | Admit: 2020-06-24 | Discharge: 2020-06-24 | Disposition: A | Discharge status: Home / Self Care | Payer: Medicare Other | Source: Ambulatory Visit | Attending: Internal Medicine | Admitting: Internal Medicine

## 2020-06-24 DIAGNOSIS — J387 Other diseases of larynx: Secondary | ICD-10-CM | POA: Diagnosis not present

## 2020-06-24 DIAGNOSIS — E041 Nontoxic single thyroid nodule: Secondary | ICD-10-CM | POA: Diagnosis not present

## 2020-06-24 DIAGNOSIS — Z7901 Long term (current) use of anticoagulants: Secondary | ICD-10-CM | POA: Diagnosis not present

## 2020-06-24 DIAGNOSIS — R221 Localized swelling, mass and lump, neck: Secondary | ICD-10-CM

## 2020-06-24 DIAGNOSIS — E049 Nontoxic goiter, unspecified: Secondary | ICD-10-CM | POA: Diagnosis not present

## 2020-06-24 DIAGNOSIS — I480 Paroxysmal atrial fibrillation: Secondary | ICD-10-CM | POA: Diagnosis not present

## 2020-06-24 DIAGNOSIS — Z87828 Personal history of other (healed) physical injury and trauma: Secondary | ICD-10-CM | POA: Diagnosis not present

## 2020-06-24 DIAGNOSIS — J019 Acute sinusitis, unspecified: Secondary | ICD-10-CM | POA: Diagnosis not present

## 2020-06-24 MED ORDER — IOHEXOL 300 MG/ML  SOLN
75.0000 mL | Freq: Once | INTRAMUSCULAR | Status: AC | PRN
  Administered 2020-06-24: 75 mL via INTRAVENOUS

## 2020-06-26 ENCOUNTER — Other Ambulatory Visit: Payer: Self-pay

## 2020-06-26 ENCOUNTER — Encounter (INDEPENDENT_AMBULATORY_CARE_PROVIDER_SITE_OTHER): Payer: Self-pay

## 2020-06-26 ENCOUNTER — Telehealth: Payer: Self-pay | Admitting: Cardiology

## 2020-06-26 ENCOUNTER — Encounter (INDEPENDENT_AMBULATORY_CARE_PROVIDER_SITE_OTHER): Payer: Medicare Other | Admitting: Ophthalmology

## 2020-06-26 DIAGNOSIS — R221 Localized swelling, mass and lump, neck: Secondary | ICD-10-CM | POA: Diagnosis not present

## 2020-06-26 DIAGNOSIS — R9389 Abnormal findings on diagnostic imaging of other specified body structures: Secondary | ICD-10-CM | POA: Diagnosis not present

## 2020-06-26 DIAGNOSIS — Z1152 Encounter for screening for COVID-19: Secondary | ICD-10-CM | POA: Diagnosis not present

## 2020-06-26 NOTE — Telephone Encounter (Signed)
Spoke to [place].  Agree with stopping Eliquis.  He has consulted ENT as well as vascular given the findings on the CT scan.  Appreciate update.  Candee Furbish, MD

## 2020-06-26 NOTE — Telephone Encounter (Signed)
Dr. Philip Aspen called regarding the mutual patient of Dr. Marlou Porch - he would like to pass a message along to Dr. Marlou Porch  Patient has a large mass on the left side of her neck, he believes it is bleeding from the thyroid. He would like to check if Dr. Marlou Porch will ok stopping the patients Eloquis.   Please have Dr. Marlou Porch call/advise - Dr. Shon Baton cell number is [phone].   Thank you!

## 2020-06-27 DIAGNOSIS — D440 Neoplasm of uncertain behavior of thyroid gland: Secondary | ICD-10-CM | POA: Diagnosis not present

## 2020-06-27 DIAGNOSIS — R1312 Dysphagia, oropharyngeal phase: Secondary | ICD-10-CM | POA: Diagnosis not present

## 2020-07-01 ENCOUNTER — Other Ambulatory Visit: Payer: Self-pay

## 2020-07-01 ENCOUNTER — Ambulatory Visit (HOSPITAL_BASED_OUTPATIENT_CLINIC_OR_DEPARTMENT_OTHER)
Admission: RE | Admit: 2020-07-01 | Discharge: 2020-07-01 | Disposition: A | Discharge status: Home / Self Care | Payer: Medicare Other | Source: Ambulatory Visit | Attending: Physician Assistant | Admitting: Physician Assistant

## 2020-07-01 ENCOUNTER — Emergency Department (HOSPITAL_COMMUNITY): Payer: Medicare Other

## 2020-07-01 ENCOUNTER — Encounter (HOSPITAL_COMMUNITY): Payer: Self-pay | Admitting: Physician Assistant

## 2020-07-01 ENCOUNTER — Encounter (HOSPITAL_COMMUNITY): Payer: Self-pay

## 2020-07-01 ENCOUNTER — Inpatient Hospital Stay (HOSPITAL_COMMUNITY)
Admission: EM | Admit: 2020-07-01 | Discharge: 2020-07-05 | DRG: 640 | Disposition: A | Discharge status: Skilled Nursing Facility | Payer: Medicare Other | Source: Ambulatory Visit | Attending: Internal Medicine | Admitting: Internal Medicine

## 2020-07-01 VITALS — BP 150/60 | HR 98 | Ht 59.0 in | Wt 93.8 lb

## 2020-07-01 DIAGNOSIS — I10 Essential (primary) hypertension: Secondary | ICD-10-CM | POA: Insufficient documentation

## 2020-07-01 DIAGNOSIS — H3530 Unspecified macular degeneration: Secondary | ICD-10-CM | POA: Diagnosis present

## 2020-07-01 DIAGNOSIS — R531 Weakness: Secondary | ICD-10-CM | POA: Diagnosis not present

## 2020-07-01 DIAGNOSIS — Z7901 Long term (current) use of anticoagulants: Secondary | ICD-10-CM | POA: Insufficient documentation

## 2020-07-01 DIAGNOSIS — Y939 Activity, unspecified: Secondary | ICD-10-CM | POA: Insufficient documentation

## 2020-07-01 DIAGNOSIS — I480 Paroxysmal atrial fibrillation: Secondary | ICD-10-CM | POA: Diagnosis present

## 2020-07-01 DIAGNOSIS — R1314 Dysphagia, pharyngoesophageal phase: Secondary | ICD-10-CM | POA: Diagnosis present

## 2020-07-01 DIAGNOSIS — M7981 Nontraumatic hematoma of soft tissue: Secondary | ICD-10-CM | POA: Diagnosis present

## 2020-07-01 DIAGNOSIS — Z66 Do not resuscitate: Secondary | ICD-10-CM | POA: Diagnosis present

## 2020-07-01 DIAGNOSIS — I6782 Cerebral ischemia: Secondary | ICD-10-CM | POA: Diagnosis not present

## 2020-07-01 DIAGNOSIS — I672 Cerebral atherosclerosis: Secondary | ICD-10-CM | POA: Diagnosis not present

## 2020-07-01 DIAGNOSIS — E860 Dehydration: Principal | ICD-10-CM | POA: Diagnosis present

## 2020-07-01 DIAGNOSIS — J189 Pneumonia, unspecified organism: Secondary | ICD-10-CM | POA: Diagnosis present

## 2020-07-01 DIAGNOSIS — E43 Unspecified severe protein-calorie malnutrition: Secondary | ICD-10-CM | POA: Diagnosis present

## 2020-07-01 DIAGNOSIS — I509 Heart failure, unspecified: Secondary | ICD-10-CM | POA: Diagnosis not present

## 2020-07-01 DIAGNOSIS — R5383 Other fatigue: Secondary | ICD-10-CM | POA: Insufficient documentation

## 2020-07-01 DIAGNOSIS — R64 Cachexia: Secondary | ICD-10-CM | POA: Diagnosis not present

## 2020-07-01 DIAGNOSIS — R778 Other specified abnormalities of plasma proteins: Secondary | ICD-10-CM | POA: Diagnosis present

## 2020-07-01 DIAGNOSIS — E041 Nontoxic single thyroid nodule: Secondary | ICD-10-CM | POA: Insufficient documentation

## 2020-07-01 DIAGNOSIS — E0520 Thyrotoxicosis with toxic multinodular goiter without thyrotoxic crisis or storm: Secondary | ICD-10-CM | POA: Diagnosis present

## 2020-07-01 DIAGNOSIS — Y929 Unspecified place or not applicable: Secondary | ICD-10-CM | POA: Insufficient documentation

## 2020-07-01 DIAGNOSIS — I38 Endocarditis, valve unspecified: Secondary | ICD-10-CM | POA: Insufficient documentation

## 2020-07-01 DIAGNOSIS — I340 Nonrheumatic mitral (valve) insufficiency: Secondary | ICD-10-CM | POA: Diagnosis present

## 2020-07-01 DIAGNOSIS — I5032 Chronic diastolic (congestive) heart failure: Secondary | ICD-10-CM

## 2020-07-01 DIAGNOSIS — T148XXA Other injury of unspecified body region, initial encounter: Secondary | ICD-10-CM | POA: Insufficient documentation

## 2020-07-01 DIAGNOSIS — Z6821 Body mass index (BMI) 21.0-21.9, adult: Secondary | ICD-10-CM

## 2020-07-01 DIAGNOSIS — R600 Localized edema: Secondary | ICD-10-CM | POA: Diagnosis present

## 2020-07-01 DIAGNOSIS — I110 Hypertensive heart disease with heart failure: Secondary | ICD-10-CM | POA: Diagnosis not present

## 2020-07-01 DIAGNOSIS — R54 Age-related physical debility: Secondary | ICD-10-CM | POA: Diagnosis present

## 2020-07-01 DIAGNOSIS — Z90710 Acquired absence of both cervix and uterus: Secondary | ICD-10-CM

## 2020-07-01 DIAGNOSIS — D6869 Other thrombophilia: Secondary | ICD-10-CM | POA: Insufficient documentation

## 2020-07-01 DIAGNOSIS — E039 Hypothyroidism, unspecified: Secondary | ICD-10-CM | POA: Diagnosis present

## 2020-07-01 DIAGNOSIS — E876 Hypokalemia: Secondary | ICD-10-CM | POA: Diagnosis present

## 2020-07-01 DIAGNOSIS — R609 Edema, unspecified: Secondary | ICD-10-CM

## 2020-07-01 DIAGNOSIS — Z20822 Contact with and (suspected) exposure to covid-19: Secondary | ICD-10-CM | POA: Diagnosis not present

## 2020-07-01 DIAGNOSIS — J9811 Atelectasis: Secondary | ICD-10-CM | POA: Diagnosis present

## 2020-07-01 DIAGNOSIS — Z79899 Other long term (current) drug therapy: Secondary | ICD-10-CM

## 2020-07-01 DIAGNOSIS — Z903 Acquired absence of stomach [part of]: Secondary | ICD-10-CM

## 2020-07-01 DIAGNOSIS — Z881 Allergy status to other antibiotic agents status: Secondary | ICD-10-CM

## 2020-07-01 DIAGNOSIS — D638 Anemia in other chronic diseases classified elsewhere: Secondary | ICD-10-CM | POA: Diagnosis present

## 2020-07-01 DIAGNOSIS — R7989 Other specified abnormal findings of blood chemistry: Secondary | ICD-10-CM | POA: Diagnosis present

## 2020-07-01 DIAGNOSIS — I4811 Longstanding persistent atrial fibrillation: Secondary | ICD-10-CM

## 2020-07-01 DIAGNOSIS — Z880 Allergy status to penicillin: Secondary | ICD-10-CM

## 2020-07-01 DIAGNOSIS — Z90722 Acquired absence of ovaries, bilateral: Secondary | ICD-10-CM

## 2020-07-01 DIAGNOSIS — Z8711 Personal history of peptic ulcer disease: Secondary | ICD-10-CM

## 2020-07-01 DIAGNOSIS — Z882 Allergy status to sulfonamides status: Secondary | ICD-10-CM

## 2020-07-01 LAB — RESPIRATORY PANEL BY RT PCR (FLU A&B, COVID)
Influenza A by PCR: NEGATIVE
Influenza B by PCR: NEGATIVE
SARS Coronavirus 2 by RT PCR: NEGATIVE

## 2020-07-01 LAB — CBC WITH DIFFERENTIAL/PLATELET
Abs Immature Granulocytes: 0.06 10*3/uL (ref 0.00–0.07)
Basophils Absolute: 0 10*3/uL (ref 0.0–0.1)
Basophils Relative: 0 %
Eosinophils Absolute: 0 10*3/uL (ref 0.0–0.5)
Eosinophils Relative: 0 %
HCT: 37.2 % (ref 36.0–46.0)
Hemoglobin: 11.3 g/dL — ABNORMAL LOW (ref 12.0–15.0)
Immature Granulocytes: 0 %
Lymphocytes Relative: 2 %
Lymphs Abs: 0.3 10*3/uL — ABNORMAL LOW (ref 0.7–4.0)
MCH: 27.6 pg (ref 26.0–34.0)
MCHC: 30.4 g/dL (ref 30.0–36.0)
MCV: 91 fL (ref 80.0–100.0)
Monocytes Absolute: 0.7 10*3/uL (ref 0.1–1.0)
Monocytes Relative: 5 %
Neutro Abs: 12.8 10*3/uL — ABNORMAL HIGH (ref 1.7–7.7)
Neutrophils Relative %: 93 %
Platelets: 233 10*3/uL (ref 150–400)
RBC: 4.09 MIL/uL (ref 3.87–5.11)
RDW: 15.2 % (ref 11.5–15.5)
WBC: 14 10*3/uL — ABNORMAL HIGH (ref 4.0–10.5)
nRBC: 0 % (ref 0.0–0.2)

## 2020-07-01 LAB — TROPONIN I (HIGH SENSITIVITY): Troponin I (High Sensitivity): 57 ng/L — ABNORMAL HIGH (ref <18)

## 2020-07-01 LAB — COMPREHENSIVE METABOLIC PANEL
ALT: 31 U/L (ref 0–44)
AST: 38 U/L (ref 15–41)
Albumin: 3.5 g/dL (ref 3.5–5.0)
Alkaline Phosphatase: 68 U/L (ref 38–126)
Anion gap: 13 (ref 5–15)
BUN: 22 mg/dL (ref 8–23)
CO2: 27 mmol/L (ref 22–32)
Calcium: 8.1 mg/dL — ABNORMAL LOW (ref 8.9–10.3)
Chloride: 99 mmol/L (ref 98–111)
Creatinine, Ser: 0.59 mg/dL (ref 0.44–1.00)
GFR, Estimated: >60 mL/min (ref >60)
Glucose, Bld: 168 mg/dL — ABNORMAL HIGH (ref 70–99)
Potassium: 3.9 mmol/L (ref 3.5–5.1)
Sodium: 139 mmol/L (ref 135–145)
Total Bilirubin: 2.4 mg/dL — ABNORMAL HIGH (ref 0.3–1.2)
Total Protein: 6.2 g/dL — ABNORMAL LOW (ref 6.5–8.1)

## 2020-07-01 LAB — URINALYSIS, ROUTINE W REFLEX MICROSCOPIC
Bilirubin Urine: NEGATIVE
Glucose, UA: NEGATIVE mg/dL
Ketones, ur: 20 mg/dL — AB
Leukocytes,Ua: NEGATIVE
Nitrite: NEGATIVE
Protein, ur: 30 mg/dL — AB
Specific Gravity, Urine: 1.017 (ref 1.005–1.030)
pH: 6 (ref 5.0–8.0)

## 2020-07-01 LAB — BRAIN NATRIURETIC PEPTIDE: B Natriuretic Peptide: 1327.2 pg/mL — ABNORMAL HIGH (ref 0.0–100.0)

## 2020-07-01 LAB — STREP PNEUMONIAE URINARY ANTIGEN: Strep Pneumo Urinary Antigen: NEGATIVE

## 2020-07-01 LAB — TSH: TSH: 0.026 u[IU]/mL — ABNORMAL LOW (ref 0.350–4.500)

## 2020-07-01 MED ORDER — LOSARTAN POTASSIUM 50 MG PO TABS
100.0000 mg | ORAL_TABLET | Freq: Every day | ORAL | Status: DC
  Administered 2020-07-01 – 2020-07-05 (×5): 100 mg via ORAL
  Filled 2020-07-01 (×5): qty 2

## 2020-07-01 MED ORDER — HYDRALAZINE HCL 20 MG/ML IJ SOLN: 10.0000 mg | Freq: Four times a day (QID) | INTRAMUSCULAR | Status: DC | PRN

## 2020-07-01 MED ORDER — AMIODARONE HCL 200 MG PO TABS
200.0000 mg | ORAL_TABLET | Freq: Every day | ORAL | Status: DC
  Administered 2020-07-01 – 2020-07-05 (×5): 200 mg via ORAL
  Filled 2020-07-01 (×5): qty 1

## 2020-07-01 MED ORDER — SODIUM CHLORIDE 0.9 % IV SOLN
100.0000 mg | Freq: Two times a day (BID) | INTRAVENOUS | Status: DC
  Administered 2020-07-01 – 2020-07-02 (×2): 100 mg via INTRAVENOUS
  Filled 2020-07-01 (×3): qty 100

## 2020-07-01 MED ORDER — FENTANYL CITRATE (PF) 100 MCG/2ML IJ SOLN
25.0000 ug | Freq: Once | INTRAMUSCULAR | Status: AC
  Administered 2020-07-01: 25 ug via INTRAVENOUS
  Filled 2020-07-01: qty 2

## 2020-07-01 MED ORDER — SODIUM CHLORIDE 0.9 % IV SOLN: 500.0000 mg | INTRAVENOUS | Status: DC

## 2020-07-01 MED ORDER — FUROSEMIDE 10 MG/ML IJ SOLN
40.0000 mg | Freq: Once | INTRAMUSCULAR | Status: AC
  Administered 2020-07-01: 40 mg via INTRAVENOUS
  Filled 2020-07-01: qty 4

## 2020-07-01 MED ORDER — SODIUM CHLORIDE 0.9 % IV SOLN
2.0000 g | INTRAVENOUS | Status: DC
  Administered 2020-07-01: 2 g via INTRAVENOUS
  Filled 2020-07-01 (×2): qty 20

## 2020-07-01 NOTE — ED Notes (Addendum)
Pt's family member Cristela Blue [phone], would like a call or update about Ms. Dawn Hampton when results come back and a general update on how she's doing and if they have decided to admit her into the hospital or not. Zigmund Daniel is Dawn Hampton's ride home. Zigmund Daniel is going to go home for a little while to get something's done while she waits on Dawn Hampton.

## 2020-07-01 NOTE — ED Triage Notes (Signed)
Pt sent by a fib clinic for further evaluation of generalized weakness, possible anemia. Pt recently had thyroid ultrasound last week, significant bruising noted to neck and upper chest. Pt is not taking any blood thinners anymore due to hx of Gi bleed. Pt sitting with eyes closed in triage, answers questions appropriately.

## 2020-07-01 NOTE — H&P (Signed)
History and Physical   Dawn Hampton Sabra Hampton ZOX:[id number] DOB: 1922-01-13 DOA: 07/01/2020  PCP: Leanna Battles, MD  Patient coming from: afib clinic  I have personally briefly reviewed patient's old medical records in [place].  Chief Concern: weakness  HPI: Dawn Hampton is a 86 y.o. female with medical history significant for macular degeneration of the left and right eyes with active choroidal neovascularization, cachexia, A. fib, chronic bilateral lower extremity pitting edema,hypertension, moderate mitral regurgitation and mild aortic regurgitation.  Presented to the emergency department for weakness at the advice from APP at A. fib clinic. She states that her weakness started in the last few days. She denies fever, productive cough, chest pain, shortness of breath, abdominal pain, dysuria, hematuria, diarrhea, trouble sleeping.  Of note she has a bulging hematoma/mass in her throat that was evaluated by ENT last week and patient states that she did not want further intervention to be done. As a result of the hematoma her Eliquis was stopped.  ED Course: Discussed with ED provider requesting to admit patient for weakness.  Vitals were stable in the ED on room air.  Review of Systems: As per HPI otherwise 10 point review of systems negative.  Past Medical History:  Diagnosis Date  . Abdominal mass, right lower quadrant   . Anemia   . Hypertension   . Macular degeneration   . Osteoporosis   . Shingles    T8    Past Surgical History:  Procedure Laterality Date  . cataracts  2004  . PUD, partial gastrectomy 1992    . TOTAL ABDOMINAL HYSTERECTOMY W/ BILATERAL SALPINGOOPHORECTOMY  1975    Social History:  reports that she has never smoked. She has never used smokeless tobacco. She reports that she does not drink alcohol and does not use drugs.  Allergies  Allergen Reactions  . Bacitracin-Polymyxin B Anaphylaxis  . Sulfa Antibiotics Itching  . Penicillins  Swelling and Rash    Swelling located at injection site   No family history on file. Family history: Family history reviewed and no pertinent family medical history  Prior to Admission medications   Medication Sig Start Date End Date Taking? Authorizing Provider  acetaminophen (TYLENOL) 650 MG CR tablet Take 1,300 mg by mouth every morning.    [provider]  amiodarone (PACERONE) 200 MG tablet TAKE 1 TABLET BY MOUTH EVERY DAY 06/06/20   Fenton, Clint R, PA  beta carotene w/minerals (OCUVITE) tablet Take 1 tablet by mouth daily.    [provider]  calcium carbonate (TUMS - DOSED IN MG ELEMENTAL CALCIUM) 500 MG chewable tablet Chew 1 tablet by mouth 2 (two) times daily.     [provider]  ELIQUIS 2.5 MG TABS tablet TAKE 1 TABLET BY MOUTH TWICE A DAY Patient not taking: Reported on 07/01/2020 03/28/20   Fenton, Clint R, PA  iron polysaccharides (NIFEREX) 150 MG capsule Take 150 mg by mouth daily.     [provider]  latanoprost (XALATAN) 0.005 % ophthalmic solution Place 1 drop into both eyes at bedtime.  01/23/20   [provider]  loperamide (IMODIUM A-D) 2 MG tablet Take 2 mg by mouth as needed for diarrhea or loose stools.     [provider]  loratadine (CLARITIN) 10 MG tablet Take 10 mg by mouth daily as needed for allergies.     [provider]  losartan (COZAAR) 100 MG tablet Take 100 mg by mouth daily.    [provider]  Polyethyl Glycol-Propyl Glycol (SYSTANE OP) Apply 1 drop to eye 2 (two) times daily.    [provider]  traMADol Veatrice Bourbon) 50 MG tablet  06/26/20   [provider]  Vitamin D, Ergocalciferol, (DRISDOL) 50000 UNITS CAPS capsule Take 50,000 Units by mouth every 7 (seven) days. Mondays    [provider]   Physical Exam: Vitals:   07/01/20 1340 07/01/20 1445 07/01/20 1500 07/01/20 1515  BP:  (!) 169/67 (!) 163/60 (!) 152/62  Pulse: 93 92 88 84  Resp: (!) 21 12 11 20     Temp:      TempSrc:      SpO2: 96% 97% 96% 97%  Weight:      Height:       Constitutional: NAD, cachectic with bilateral temporal and orbital wasting Eyes: PERRL, lids and conjunctivae normal ENMT: Mucous membranes are moist. Posterior pharynx clear of any exudate or lesions.Normal dentition.  Neck: normal, supple, no masses, no thyromegaly Respiratory: clear to auscultation bilaterally, no wheezing, no crackles. Normal respiratory effort. No accessory muscle use.  Cardiovascular: Regular rate and rhythm, no murmurs / rubs / gallops. 3+ pitting edema bilaterally. 2+ pedal pulses. No carotid bruits.  Abdomen: Scaphoid abdomen, no tenderness, no masses palpated. No hepatosplenomegaly. Bowel sounds positive.  Musculoskeletal: no clubbing / cyanosis. No joint deformity upper and lower extremities. Good ROM, no contractures. Normal muscle tone. Generalized atrophy of muscles Skin: large ecchymosis in her anterior chest present on admission. no rashes, lesions, ulcers. No induration Neurologic: CN 2-12 grossly intact. Sensation intact, DTR normal. Strength 5/5 in all 4.  Psychiatric: Normal judgment and insight. Alert and oriented x 3. Normal mood.   Labs on Admission: I have personally reviewed following labs and imaging studies  CBC: Recent Labs  Lab 07/01/20 1115  WBC 14.0*  NEUTROABS 12.8*  HGB 11.3*  HCT 37.2  MCV 91.0  PLT 299   Basic Metabolic Panel: Recent Labs  Lab 07/01/20 1115  NA 139  K 3.9  CL 99  CO2 27  GLUCOSE 168*  BUN 22  CREATININE 0.59  CALCIUM 8.1*   GFR: Estimated Creatinine Clearance: 24.8 mL/min (by C-G formula based on SCr of 0.59 mg/dL). Liver Function Tests: Recent Labs  Lab 07/01/20 1115  AST 38  ALT 31  ALKPHOS 68  BILITOT 2.4*  PROT 6.2*  ALBUMIN 3.5   Urine analysis:    Component Value Date/Time   COLORURINE AMBER (A) 07/01/2020 1335   APPEARANCEUR HAZY (A) 07/01/2020 1335   LABSPEC 1.017 07/01/2020 1335   PHURINE 6.0  07/01/2020 1335   GLUCOSEU NEGATIVE 07/01/2020 1335   HGBUR SMALL (A) 07/01/2020 1335   BILIRUBINUR NEGATIVE 07/01/2020 1335   KETONESUR 20 (A) 07/01/2020 1335   PROTEINUR 30 (A) 07/01/2020 1335   NITRITE NEGATIVE 07/01/2020 1335   LEUKOCYTESUR NEGATIVE 07/01/2020 1335   Radiological Exams on Admission: Personally reviewed and I agree with radiologist reading as below. CT Head Wo Contrast  Result Date: 07/01/2020 CLINICAL DATA:  Weakness EXAM: CT HEAD WITHOUT CONTRAST TECHNIQUE: Contiguous axial images were obtained from the base of the skull through the vertex without intravenous contrast. COMPARISON:  CT head dated 12/24/2008 FINDINGS: Brain: No evidence of acute infarction, hemorrhage, hydrocephalus, extra-axial collection or mass lesion/mass effect. Subcortical white matter and periventricular small vessel ischemic changes. Vascular: Intracranial atherosclerosis. Skull: Normal. Negative for fracture or focal lesion. Sinuses/Orbits: The visualized paranasal sinuses are essentially clear. The mastoid air cells are unopacified. Other: None. IMPRESSION: No evidence of acute intracranial  abnormality. Small vessel ischemic changes. Electronically Signed   By: Julian Hy M.D.   On: 07/01/2020 14:12   DG Chest Portable 1 View  Result Date: 07/01/2020 CLINICAL DATA:  Weakness EXAM: PORTABLE CHEST 1 VIEW COMPARISON:  05/11/2020. FINDINGS: The patient is rotated, which somewhat limits evaluation. Similar cardiomediastinal silhouette. Aortic atherosclerosis. Similar small right pleural effusion. Overlying right basilar opacity. Similar elevated right hemidiaphragm. Exaggerated thoracic kyphosis. Clips in the region of the gastroesophageal junction. IMPRESSION: Small right pleural effusion. Overlying right basilar opacity most likely represents atelectasis. Aspiration or pneumonia is not excluded. Electronically Signed   By: Margaretha Sheffield MD   On: 07/01/2020 11:28   EKG: Independently reviewed,  showing sinus rhythm with prolonged QT C and left bundle branch block present on previous EKG.  Assessment/Plan  Active Problems:   Pneumonia   Weakness with lower extremity swelling 3+ bilaterally - Lasix 40 mg IV 1 time in the ED, reassess and continue in the a.m. if appropriate - US DVT - no ultrasound for DVT ordered as patient is at risk for bleeding  Pneumonia-suspect bacterial, ceftriaxone IV -Doxycycline IV 100 mg twice daily, due to prolonged QT  A. fib-holding Eliquis per outpatient cardiologist at this time due to hematoma in the throat -Resumed home amiodarone 200 mg daily  Hypertension-losartan 100 mg p.o. daily adding hydralazine as needed  DVT prophylaxis: TED hose due to risk of bleeding Code Status: DNR Diet: Heart healthy Family Communication: None at this time Disposition Plan: Pending clinical course Consults called: None at this time Admission status: Observation with telemetry  Knight Oelkers N Juanelle Trueheart D.O. [facility]  If 7AM-7PM, please contact day-coverage provider [email or web address]  07/01/2020, 3:35 PM

## 2020-07-01 NOTE — ED Notes (Signed)
Dinner Tray Ordered @ 1704. 

## 2020-07-01 NOTE — Progress Notes (Signed)
Primary Care Physician: Leanna Battles, MD Primary Cardiologist: Dr Marlou Porch Primary Electrophysiologist: none Referring Physician: [facility]   Dawn Hampton Dawn Hampton is a 86 y.o. female with a history of HTN and new onset atrial fibrillation who presents for follow up in the [facility]. The patient was initially diagnosed with atrial fibrillation on 02/28/20 after presenting to the ED with symptoms of palpitations, lightheadedness, and a "shaky" feeling. ECG showed rapid afib HR 190s. She was given diltiazem and converted in the ED. She was also diagnosed with a UTI in the ED and started on antibiotics. She was discharged on diltiazem. She denies significant snoring or alcohol use. She denies ever having palpitations before. Patient is on Eliquis for a CHADS2VASC score of 4.  Patient has been to ED twice on 05/09/20 and 05/11/20 with brief but very symptomatic afib. She converted to SR both times without intervention. There were no specific triggers that she could identify. She was started on amiodarone 05/14/20.  On follow up today, patient reports she is feeling worse recently. Patient developed a thyroid nodule and CT scan revealed multiple thyroid nodules and a large hematoma. She was seen last week by ENT and no intervention was planned at that point with follow up in 3 weeks. Unfortunately, patient is now feeling very week, unsteady on her feet, and overall feeling very poorly which is new since her visit with ENT. Her Eliquis has been stopped 2/2 the hematoma.   Today, she denies symptoms of orthopnea, PND, dizziness, presyncope, syncope, snoring, daytime somnolence, or neurologic sequela. The patient is tolerating medications without difficulties and is otherwise without complaint today.    Atrial Fibrillation Risk Factors:  she does not have symptoms or diagnosis of sleep apnea. she does not have a history of rheumatic fever. she does not have a history of  alcohol use. The patient does not have a history of early familial atrial fibrillation or other arrhythmias.  she has a BMI of Body mass index is 18.95 kg/m.Marland Kitchen Filed Weights   07/01/20 0936  Weight: 42.5 kg    No family history on file.   Atrial Fibrillation Management history:  Previous antiarrhythmic drugs: amiodarone Previous cardioversions: none Previous ablations: none CHADS2VASC score: 4 Anticoagulation history: Eliquis   Past Medical History:  Diagnosis Date  . Abdominal mass, right lower quadrant   . Anemia   . Hypertension   . Macular degeneration   . Osteoporosis   . Shingles    T8   Past Surgical History:  Procedure Laterality Date  . cataracts  2004  . PUD, partial gastrectomy 1992    . TOTAL ABDOMINAL HYSTERECTOMY W/ BILATERAL SALPINGOOPHORECTOMY  1975    Current Outpatient Medications  Medication Sig Dispense Refill  . acetaminophen (TYLENOL) 650 MG CR tablet Take 1,300 mg by mouth every morning.    Marland Kitchen amiodarone (PACERONE) 200 MG tablet TAKE 1 TABLET BY MOUTH EVERY DAY 30 tablet 6  . beta carotene w/minerals (OCUVITE) tablet Take 1 tablet by mouth daily.    . calcium carbonate (TUMS - DOSED IN MG ELEMENTAL CALCIUM) 500 MG chewable tablet Chew 1 tablet by mouth 2 (two) times daily.     . iron polysaccharides (NIFEREX) 150 MG capsule Take 150 mg by mouth daily.     Marland Kitchen latanoprost (XALATAN) 0.005 % ophthalmic solution Place 1 drop into both eyes at bedtime.     Marland Kitchen loperamide (IMODIUM A-D) 2 MG tablet Take 2 mg by mouth as needed  for diarrhea or loose stools.     Marland Kitchen loratadine (CLARITIN) 10 MG tablet Take 10 mg by mouth daily as needed for allergies.     Marland Kitchen losartan (COZAAR) 100 MG tablet Take 100 mg by mouth daily.    Vladimir Faster Glycol-Propyl Glycol (SYSTANE OP) Apply 1 drop to eye 2 (two) times daily.    . traMADol (ULTRAM) 50 MG tablet     . Vitamin D, Ergocalciferol, (DRISDOL) 50000 UNITS CAPS capsule Take 50,000 Units by mouth every 7 (seven) days.  Mondays    . ELIQUIS 2.5 MG TABS tablet TAKE 1 TABLET BY MOUTH TWICE A DAY (Patient not taking: Reported on 07/01/2020) 60 tablet 1   No current facility-administered medications for this encounter.    Allergies  Allergen Reactions  . Bacitracin-Polymyxin B Anaphylaxis  . Sulfa Antibiotics Itching  . Penicillins Swelling and Rash    Swelling located at injection site    Social History   Socioeconomic History  . Marital status: Widowed    Spouse name: Not on file  . Number of children: Not on file  . Years of education: Not on file  . Highest education level: Not on file  Occupational History  . Not on file  Tobacco Use  . Smoking status: Never Smoker  . Smokeless tobacco: Never Used  Substance and Sexual Activity  . Alcohol use: No  . Drug use: Never  . Sexual activity: Not on file  Other Topics Concern  . Not on file  Social History Narrative  . Not on file   Social Determinants of Health   Financial Resource Strain:   . Difficulty of Paying Living Expenses: Not on file  Food Insecurity:   . Worried About Charity fundraiser in the Last Year: Not on file  . Ran Out of Food in the Last Year: Not on file  Transportation Needs:   . Lack of Transportation (Medical): Not on file  . Lack of Transportation (Non-Medical): Not on file  Physical Activity:   . Days of Exercise per Week: Not on file  . Minutes of Exercise per Session: Not on file  Stress:   . Feeling of Stress : Not on file  Social Connections:   . Frequency of Communication with Friends and Family: Not on file  . Frequency of Social Gatherings with Friends and Family: Not on file  . Attends Religious Services: Not on file  . Active Member of Clubs or Organizations: Not on file  . Attends Archivist Meetings: Not on file  . Marital Status: Not on file  Intimate Partner Violence:   . Fear of Current or Ex-Partner: Not on file  . Emotionally Abused: Not on file  . Physically Abused: Not on  file  . Sexually Abused: Not on file     ROS- All systems are reviewed and negative except as per the HPI above.  Physical Exam: Vitals:   07/01/20 0936  BP: (!) 150/60  Pulse: 98  Weight: 42.5 kg  Height: 4\' 11"  (1.499 m)    GEN- The patient is ill appearing, alert and oriented x 3 today. Lethargic HEENT-head normocephalic, atraumatic, sclera clear, conjunctiva pink, hearing intact, trachea midline, large left side neck mass. Lungs- Clear to ausculation bilaterally, normal work of breathing Heart- Regular rate and rhythm, occasional ectopic beat, no murmurs, rubs or gallops  GI- soft, NT, ND, + BS Extremities- no clubbing, cyanosis. 1+ bilateral edema MS- no significant deformity or atrophy Skin- no rash  or lesion Psych- euthymic mood, full affect Neuro- strength and sensation are intact   Wt Readings from Last 3 Encounters:  07/01/20 42.5 kg  05/31/20 43.5 kg  05/14/20 42.9 kg    EKG today demonstrates SR HR 98, LBBB, PACs, PR 144, QRS 136, QTc 518  Echo 04/01/20 demonstrated 1. Left ventricular ejection fraction, by estimation, is 55 to 60%. The  left ventricle has normal function. The left ventricle has no regional  wall motion abnormalities. Left ventricular diastolic parameters are  consistent with Grade I diastolic  dysfunction (impaired relaxation).  2. Right ventricular systolic function is normal. The right ventricular  size is normal. There is moderately elevated pulmonary artery systolic  pressure.  3. Left atrial size was severely dilated.  4. Right atrial size was moderately dilated.  5. The mitral valve is normal in structure. Moderate mitral valve  regurgitation. No evidence of mitral stenosis.  6. Tricuspid valve regurgitation is moderate.  7. The aortic valve is normal in structure. Aortic valve regurgitation is  mild. Mild aortic valve sclerosis is present, with no evidence of aortic  valve stenosis.  8. The inferior vena cava is dilated  in size with <50% respiratory  variability, suggesting right atrial pressure of 15 mmHg.   Epic records are reviewed at length today  CHA2DS2-VASc Score = 4  The patient's score is based upon: CHF History: 0 HTN History: 1 Diabetes History: 0 Stroke History: 0 Vascular Disease History: 0      ASSESSMENT AND PLAN: 1. Paroxysmal Atrial Fibrillation (ICD10:  I48.0) The patient's CHA2DS2-VASc score is 4, indicating a 4.8% annual risk of stroke.   Patient appears to be maintaining SR. Continue amiodarone 200 mg daily  2. Secondary Hypercoagulable State (ICD10:  D68.69) The patient is at significant risk for stroke/thromboembolism based upon her CHA2DS2-VASc Score of 4.   Apixaban (Eliquis) currently on hold.   3. HTN Stable, no changes today.  4. Valvular heart disease Mod MR, mild AR  5. Lethargy/Weakness Patient has new onset fatigue, generalized weakness. Having difficulty sitting up with eyes open in office today. D/w Dr Marlou Porch, will refer to ED for emergent evaluation.   6. Thyroid nodules and hematoma Plan per PCP and ENT.  Off Eliquis   Patient referred to ED for evaluation.    [facility] [address] [place], [place] [address] [phone] 07/01/2020 9:48 AM

## 2020-07-01 NOTE — ED Provider Notes (Signed)
[facility]ERGENC[facility] DEPARTMENT Provider Note   CSN: [id number] Arrival date & time: 07/01/20  1040     History Chief Complaint  Patient presents with  . Weakness    Dawn Hampton is a 86 y.o. female.  HPI 86 year old female with history of anemia, hypertension accessible A. fib presents to the ER with complaints of weakness.  History provided by the patient and her friend at bedside.  Patient was recently seen at her cardiologist Dr. Marlou Porch office, has a history of a goiter and recently had a thyroid ultrasound on 10/27.  During this ultrasound, there was reports of something "bursting", and the patient developed some bruising to her neck and chest.  She has since been not taking her Eliquis.  She was seen last week by ENT and there was no intervention planned.  However she has now continued to feel very weak, unsteady on her feet and overall feeling poorly since her visit.  Her Eliquis was stopped on 10/2 due to the hematoma.  She denies any cough, fevers, chills, though she does have some significant noted lower extremity edema.  She does endorse some shortness of breath and overall not feeling well.  The patient does live alone and normally ambulates with a cane, but has been noted to be more unsteady on her feet.  He denies any syncope, unilateral weakness, dysarthria, chest pain.    Past Medical History:  Diagnosis Date  . Abdominal mass, right lower quadrant   . Anemia   . Hypertension   . Macular degeneration   . Osteoporosis   . Shingles    T8    Patient Active Problem List   Diagnosis Date Noted  . Paroxysmal atrial fibrillation ([place]) 02/29/2020  . Secondary hypercoagulable state ([place]) 02/29/2020  . Exudative age-related macular degeneration of right eye with active choroidal neovascularization ([place]) 12/11/2019  . 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 Advanced nonexudative  age-related macular degeneration of right eye with subfoveal involvement 12/11/2019  . Exudative age-related macular degeneration of left eye with active choroidal neovascularization ([place]) 12/11/2019  . Cystoid macular edema of left eye 12/11/2019  . Posterior vitreous detachment of left eye 12/11/2019    Past Surgical History:  Procedure Laterality Date  . cataracts  2004  . PUD, partial gastrectomy 1992    . TOTAL ABDOMINAL HYSTERECTOMY W/ BILATERAL SALPINGOOPHORECTOMY  1975     OB History   No obstetric history on file.     No family history on file.  Social History   Tobacco Use  . Smoking status: Never Smoker  . Smokeless tobacco: Never Used  Substance Use Topics  . Alcohol use: No  . Drug use: Never    Home Medications Prior to Admission medications   Medication Sig Start Date End Date Taking? Authorizing Provider  acetaminophen (TYLENOL) 650 MG CR tablet Take 1,300 mg by mouth every morning.    [provider]  amiodarone (PACERONE) 200 MG tablet TAKE 1 TABLET BY MOUTH EVERY DAY 06/06/20   Fenton, Clint R, PA  beta carotene w/minerals (OCUVITE) tablet Take 1 tablet by mouth daily.    [provider]  calcium carbonate (TUMS - DOSED IN MG ELEMENTAL CALCIUM) 500 MG chewable tablet Chew 1 tablet by mouth 2 (two) times daily.     [provider]  ELIQUIS 2.5 MG TABS tablet TAKE 1 TABLET BY MOUTH TWICE A DAY Patient not taking: Reported on 07/01/2020 03/28/20  Fenton, Clint R, PA  iron polysaccharides (NIFEREX) 150 MG capsule Take 150 mg by mouth daily.     [provider]  latanoprost (XALATAN) 0.005 % ophthalmic solution Place 1 drop into both eyes at bedtime.  01/23/20   [provider]  loperamide (IMODIUM A-D) 2 MG tablet Take 2 mg by mouth as needed for diarrhea or loose stools.     [provider]  loratadine (CLARITIN) 10 MG tablet Take 10 mg by mouth daily as needed for allergies.     [provider]    losartan (COZAAR) 100 MG tablet Take 100 mg by mouth daily.    [provider]  Polyethyl Glycol-Propyl Glycol (SYSTANE OP) Apply 1 drop to eye 2 (two) times daily.    [provider]  traMADol Veatrice Bourbon) 50 MG tablet  06/26/20   [provider]  Vitamin D, Ergocalciferol, (DRISDOL) 50000 UNITS CAPS capsule Take 50,000 Units by mouth every 7 (seven) days. Mondays    [provider]    Allergies    Bacitracin-polymyxin b, Sulfa antibiotics, and Penicillins  Review of Systems   Review of Systems  Constitutional: Negative for chills and fever.  HENT: Negative for ear pain and sore throat.   Eyes: Negative for pain and visual disturbance.  Respiratory: Negative for cough and shortness of breath.   Cardiovascular: Positive for leg swelling. Negative for chest pain and palpitations.  Gastrointestinal: Negative for abdominal pain and vomiting.  Genitourinary: Negative for dysuria and hematuria.  Musculoskeletal: Negative for arthralgias and back pain.  Skin: Negative for color change and rash.  Neurological: Positive for dizziness, weakness and numbness. Negative for seizures and syncope.  All other systems reviewed and are negative.   Physical Exam Updated Vital Signs BP (!) 152/62   Pulse 84   Temp 98.1 F (36.7 C) (Oral)   Resp 20   Ht 4\' 7"  (1.397 m)   Wt 42.5 kg   SpO2 97%   BMI 21.80 kg/m   Physical Exam Vitals and nursing note reviewed.  Constitutional:      General: She is not in acute distress.    Appearance: She is well-developed. She is not ill-appearing, toxic-appearing or diaphoretic.     Comments: Cachectic in appearance   HENT:     Head: Normocephalic and atraumatic.  Eyes:     Conjunctiva/sclera: Conjunctivae normal.  Neck:     Comments: Visible goiter and bruising to the neck and chest  Cardiovascular:     Rate and Rhythm: Normal rate and regular rhythm.     Pulses: Normal pulses.     Heart sounds: Normal heart sounds.  No murmur heard.   Pulmonary:     Effort: Pulmonary effort is normal. No respiratory distress.     Breath sounds: Normal breath sounds.  Abdominal:     General: Abdomen is flat.     Palpations: Abdomen is soft.     Tenderness: There is no abdominal tenderness.  Musculoskeletal:        General: No swelling.     Cervical back: Normal range of motion and neck supple. Tenderness present.     Right lower leg: Edema present.     Left lower leg: Edema present.     Comments: 2+ pitting edema bilaterally   Skin:    General: Skin is warm and dry.     Capillary Refill: Capillary refill takes less than 2 seconds.     Findings: No erythema or lesion.  Neurological:  General: No focal deficit present.     Mental Status: She is alert and oriented to person, place, and time.     Sensory: No sensory deficit.     Motor: Weakness present.     Comments: Bilateral upper extremity weakness.   Mental Status:  Alert, thought content appropriate, able to give a coherent history. Speech fluent without evidence of aphasia. Able to follow 2 step commands without difficulty.  Cranial Nerves:  II: Peripheral visual fields grossly normal, pupils equal, round, reactive to light III,IV, VI: ptosis not present, extra-ocular motions intact bilaterally  V,VII: smile symmetric, facial light touch sensation equal VIII: hearing grossly normal to voice  X: uvula elevates symmetrically  XI: bilateral shoulder shrug symmetric and strong XII: midline tongue extension without fassiculations Motor:  Normal tone. 4/5 strength in upper extremities, 5/5 BLE major muscle groups  Sensory: light touch normal in all extremities. Cerebellar: normal finger-to-nose with bilateral upper extremities, Romberg sign absent Gait:not accessed    Psychiatric:        Mood and Affect: Mood normal.        Behavior: Behavior normal.     Comments: A&O x3      ED Results / Procedures / Treatments   Labs (all labs ordered are  listed, but only abnormal results are displayed) Labs Reviewed  CBC WITH DIFFERENTIAL/PLATELET - Abnormal; Notable for the following components:      Result Value   WBC 14.0 (*)    Hemoglobin 11.3 (*)    Neutro Abs 12.8 (*)    Lymphs Abs 0.3 (*)    All other components within normal limits  COMPREHENSIVE METABOLIC PANEL - Abnormal; Notable for the following components:   Glucose, Bld 168 (*)    Calcium 8.1 (*)    Total Protein 6.2 (*)    Total Bilirubin 2.4 (*)    All other components within normal limits  URINALYSIS, ROUTINE W REFLEX MICROSCOPIC - Abnormal; Notable for the following components:   Color, Urine AMBER (*)    APPearance HAZY (*)    Hgb urine dipstick SMALL (*)    Ketones, ur 20 (*)    Protein, ur 30 (*)    Bacteria, UA MANY (*)    All other components within normal limits  BRAIN NATRIURETIC PEPTIDE - Abnormal; Notable for the following components:   B Natriuretic Peptide 1,327.2 (*)    All other components within normal limits  TROPONIN I (HIGH SENSITIVITY) - Abnormal; Notable for the following components:   Troponin I (High Sensitivity) 57 (*)    All other components within normal limits  URINE CULTURE  RESPIRATORY PANEL BY RT PCR (FLU A&B, COVID)  POC OCCULT BLOOD, ED  TROPONIN I (HIGH SENSITIVITY)    EKG EKG Interpretation  Date/Time:  Monday July 01 2020 11:09:32 EDT Ventricular Rate:  98 PR Interval:    QRS Duration: 143 QT Interval:  402 QTC Calculation: 514 R Axis:   -35 Text Interpretation: Sinus rhythm Atrial premature complex Left bundle branch block Confirmed by Gerlene Fee [phone]) on 07/01/2020 11:24:56 AM   Radiology CT Head Wo Contrast  Result Date: 07/01/2020 CLINICAL DATA:  Weakness EXAM: CT HEAD WITHOUT CONTRAST TECHNIQUE: Contiguous axial images were obtained from the base of the skull through the vertex without intravenous contrast. COMPARISON:  CT head dated 12/24/2008 FINDINGS: Brain: No evidence of acute infarction,  hemorrhage, hydrocephalus, extra-axial collection or mass lesion/mass effect. Subcortical white matter and periventricular small vessel ischemic changes. Vascular: Intracranial atherosclerosis. Skull: Normal.  Negative for fracture or focal lesion. Sinuses/Orbits: The visualized paranasal sinuses are essentially clear. The mastoid air cells are unopacified. Other: None. IMPRESSION: No evidence of acute intracranial abnormality. Small vessel ischemic changes. Electronically Signed   By: Julian Hy M.D.   On: 07/01/2020 14:12   DG Chest Portable 1 View  Result Date: 07/01/2020 CLINICAL DATA:  Weakness EXAM: PORTABLE CHEST 1 VIEW COMPARISON:  05/11/2020. FINDINGS: The patient is rotated, which somewhat limits evaluation. Similar cardiomediastinal silhouette. Aortic atherosclerosis. Similar small right pleural effusion. Overlying right basilar opacity. Similar elevated right hemidiaphragm. Exaggerated thoracic kyphosis. Clips in the region of the gastroesophageal junction. IMPRESSION: Small right pleural effusion. Overlying right basilar opacity most likely represents atelectasis. Aspiration or pneumonia is not excluded. Electronically Signed   By: Margaretha Sheffield MD   On: 07/01/2020 11:28    Procedures Procedures (including critical care time)  Medications Ordered in ED Medications  furosemide (LASIX) injection 40 mg (40 mg Intravenous Given 07/01/20 1438)  fentaNYL (SUBLIMAZE) injection 25 mcg (25 mcg Intravenous Given 07/01/20 1439)    ED Course  I have reviewed the triage vital signs and the nursing notes.  Pertinent labs & imaging results that were available during my care of the patient were reviewed by me and considered in my medical decision making (see chart for details).    MDM Rules/Calculators/A&P                         86 year old female with complaints of weakness for several days On presentation, she is cachectic in appearance, alert and oriented x3.  Vitals on arrival with  some hypertension, BP of 161/65, however other vitals are reassuring.  No evidence of tachycardia, hypoxia or tachypnea.  Physical exam with some visible bruising to her neck and chest, she also has some 2+ pitting edema to her lower extremities.  She does have some bilateral upper extremity weakness, however no other neurologic deficits noted.  DDx includes anemia versus pneumonia/infection versus CHF versus UTI vs stroke   Labs reviewed and interpreted by me -CBC significant for a leukocytosis of 14, hemoglobin of 11.3 which is actually improved from prior. -CMP without electrolyte abnormalities, normal renal function.  Normal LFTs.  Mild hypocalcemia noted however this appears to be baseline. -Initial troponin of 57, patient appears to have a history of elevated troponin but this does appear to be elevated from baseline. -BNP of 1327.2 which is markedly elevated from prior which was in the 600s -UA with small amount of hemoglobin, ketones and proteinuria, many bacteria, however no clear evidence of UTI.  Culture sent.  Imaging reviewed and interpreted by me -CT of the head without evidence of bleeding or intracranial abnormalities.  Chronic ischemic changes noted -Chest x-ray with small right atelectasis, question fluid overload versus pneumonia  EKG reviewed and interpreted by Dr. Sedonia Small and myself -No significant changes, no evidence of A. Fib  MDM: Patient with heart failure exacerbation versus pneumonia.  She does have a leukocytosis of 14, and a right sided pleural effusion, however her BNP is also extremely elevated and her troponin is elevated as well.  She does have 2+ pitting edema.  Patient was given some pain medicine for her goiter she was complaining of neck pain after the CT.  Given 40 of Lasix as well.  Last echo showed an EF of 55 to 60%.  Consulted with hospitalist team who will admit the patient for evaluation and treatment.  I updated the  patient and her son Ronalee Belts who is aware of  plan.  Remains hemodynamically stable here in the ED at this time.  Pt was also seen and evaluated by Dr. Sedonia Small who is agreeable to the above plan and disposition  Final Clinical Impression(s) / ED Diagnoses Final diagnoses:  Weakness  Acute on chronic congestive heart failure, unspecified heart failure type ([place])  Elevated troponin    Rx / DC Orders ED Discharge Orders    None       Garald Balding, PA-C 07/01/20 1527    Maudie Flakes, MD 07/01/20 1648

## 2020-07-01 NOTE — ED Notes (Signed)
First troponin sent to main lab at 11:15 and never got resulted by laboratory

## 2020-07-02 ENCOUNTER — Encounter (HOSPITAL_COMMUNITY): Payer: Self-pay | Admitting: Internal Medicine

## 2020-07-02 DIAGNOSIS — E0520 Thyrotoxicosis with toxic multinodular goiter without thyrotoxic crisis or storm: Secondary | ICD-10-CM | POA: Diagnosis present

## 2020-07-02 DIAGNOSIS — R1314 Dysphagia, pharyngoesophageal phase: Secondary | ICD-10-CM | POA: Diagnosis present

## 2020-07-02 DIAGNOSIS — Z880 Allergy status to penicillin: Secondary | ICD-10-CM | POA: Diagnosis not present

## 2020-07-02 DIAGNOSIS — J9811 Atelectasis: Secondary | ICD-10-CM | POA: Diagnosis present

## 2020-07-02 DIAGNOSIS — D6869 Other thrombophilia: Secondary | ICD-10-CM | POA: Diagnosis present

## 2020-07-02 DIAGNOSIS — I340 Nonrheumatic mitral (valve) insufficiency: Secondary | ICD-10-CM | POA: Diagnosis present

## 2020-07-02 DIAGNOSIS — E860 Dehydration: Secondary | ICD-10-CM | POA: Diagnosis present

## 2020-07-02 DIAGNOSIS — R531 Weakness: Secondary | ICD-10-CM | POA: Diagnosis not present

## 2020-07-02 DIAGNOSIS — R64 Cachexia: Secondary | ICD-10-CM | POA: Diagnosis present

## 2020-07-02 DIAGNOSIS — Z66 Do not resuscitate: Secondary | ICD-10-CM | POA: Diagnosis present

## 2020-07-02 DIAGNOSIS — M7981 Nontraumatic hematoma of soft tissue: Secondary | ICD-10-CM | POA: Diagnosis present

## 2020-07-02 DIAGNOSIS — R7989 Other specified abnormal findings of blood chemistry: Secondary | ICD-10-CM | POA: Diagnosis present

## 2020-07-02 DIAGNOSIS — E039 Hypothyroidism, unspecified: Secondary | ICD-10-CM | POA: Diagnosis present

## 2020-07-02 DIAGNOSIS — M6259 Muscle wasting and atrophy, not elsewhere classified, multiple sites: Secondary | ICD-10-CM | POA: Diagnosis not present

## 2020-07-02 DIAGNOSIS — E43 Unspecified severe protein-calorie malnutrition: Secondary | ICD-10-CM | POA: Diagnosis present

## 2020-07-02 DIAGNOSIS — R1310 Dysphagia, unspecified: Secondary | ICD-10-CM | POA: Diagnosis not present

## 2020-07-02 DIAGNOSIS — Z20822 Contact with and (suspected) exposure to covid-19: Secondary | ICD-10-CM | POA: Diagnosis present

## 2020-07-02 DIAGNOSIS — R778 Other specified abnormalities of plasma proteins: Secondary | ICD-10-CM | POA: Diagnosis present

## 2020-07-02 DIAGNOSIS — R600 Localized edema: Secondary | ICD-10-CM | POA: Diagnosis present

## 2020-07-02 DIAGNOSIS — Z882 Allergy status to sulfonamides status: Secondary | ICD-10-CM | POA: Diagnosis not present

## 2020-07-02 DIAGNOSIS — S1093XD Contusion of unspecified part of neck, subsequent encounter: Secondary | ICD-10-CM | POA: Diagnosis not present

## 2020-07-02 DIAGNOSIS — H3530 Unspecified macular degeneration: Secondary | ICD-10-CM | POA: Diagnosis present

## 2020-07-02 DIAGNOSIS — E876 Hypokalemia: Secondary | ICD-10-CM

## 2020-07-02 DIAGNOSIS — I480 Paroxysmal atrial fibrillation: Secondary | ICD-10-CM | POA: Diagnosis present

## 2020-07-02 DIAGNOSIS — D638 Anemia in other chronic diseases classified elsewhere: Secondary | ICD-10-CM | POA: Diagnosis present

## 2020-07-02 DIAGNOSIS — R54 Age-related physical debility: Secondary | ICD-10-CM | POA: Diagnosis present

## 2020-07-02 DIAGNOSIS — Z881 Allergy status to other antibiotic agents status: Secondary | ICD-10-CM | POA: Diagnosis not present

## 2020-07-02 DIAGNOSIS — I10 Essential (primary) hypertension: Secondary | ICD-10-CM | POA: Diagnosis present

## 2020-07-02 LAB — CBC
HCT: 29.7 % — ABNORMAL LOW (ref 36.0–46.0)
Hemoglobin: 9.2 g/dL — ABNORMAL LOW (ref 12.0–15.0)
MCH: 27.6 pg (ref 26.0–34.0)
MCHC: 31 g/dL (ref 30.0–36.0)
MCV: 89.2 fL (ref 80.0–100.0)
Platelets: 194 10*3/uL (ref 150–400)
RBC: 3.33 MIL/uL — ABNORMAL LOW (ref 3.87–5.11)
RDW: 15.2 % (ref 11.5–15.5)
WBC: 16.1 10*3/uL — ABNORMAL HIGH (ref 4.0–10.5)
nRBC: 0 % (ref 0.0–0.2)

## 2020-07-02 LAB — URINE CULTURE

## 2020-07-02 LAB — BASIC METABOLIC PANEL
Anion gap: 14 (ref 5–15)
BUN: 15 mg/dL (ref 8–23)
CO2: 29 mmol/L (ref 22–32)
Calcium: 7.2 mg/dL — ABNORMAL LOW (ref 8.9–10.3)
Chloride: 95 mmol/L — ABNORMAL LOW (ref 98–111)
Creatinine, Ser: 0.57 mg/dL (ref 0.44–1.00)
GFR, Estimated: >60 mL/min (ref >60)
Glucose, Bld: 123 mg/dL — ABNORMAL HIGH (ref 70–99)
Potassium: 3 mmol/L — ABNORMAL LOW (ref 3.5–5.1)
Sodium: 138 mmol/L (ref 135–145)

## 2020-07-02 LAB — MAGNESIUM: Magnesium: 1.9 mg/dL (ref 1.7–2.4)

## 2020-07-02 LAB — TROPONIN I (HIGH SENSITIVITY): Troponin I (High Sensitivity): 72 ng/L — ABNORMAL HIGH (ref <18)

## 2020-07-02 LAB — PROCALCITONIN: Procalcitonin: <0.1 ng/mL

## 2020-07-02 LAB — T4, FREE: Free T4: 3.08 ng/dL — ABNORMAL HIGH (ref 0.61–1.12)

## 2020-07-02 MED ORDER — POTASSIUM CHLORIDE CRYS ER 20 MEQ PO TBCR
40.0000 meq | EXTENDED_RELEASE_TABLET | Freq: Once | ORAL | Status: AC
  Administered 2020-07-02: 40 meq via ORAL
  Filled 2020-07-02: qty 2

## 2020-07-02 MED ORDER — SODIUM CHLORIDE 0.9 % IV SOLN: INTRAVENOUS | Status: AC

## 2020-07-02 NOTE — Progress Notes (Addendum)
PROGRESS NOTE   Dawn Hampton  IOE:[id number]    DOB: 30-Oct-1921    DOA: 07/01/2020  PCP: Leanna Battles, MD   I have briefly reviewed patients previous medical records in [facility].  Chief Complaint  Patient presents with  . Weakness    Brief Narrative:  86 year old female, lives alone, ambulates with the help of a cane, PMH of paroxysmal atrial fibrillation, secondary hypercoagulable state, essential hypertension, valvular heart disease (moderate MR, mild AR), macular degeneration, chronic bilateral lower extremity pitting edema, evaluated by ENT/Dr. Benjamine Mola the week PTA due to bulging left side of neck mass/hematoma at which time Eliquis was stopped, sent to ED from A. fib clinic for admission due to poor oral intake, profound weakness, unsteady gait and overall feeling poorly.  Admitted for weakness which is suspected to be multifactorial but mostly due to dehydration from poor oral intake.   Assessment & Plan:  Active Problems:   Pneumonia   Generalized weakness   Profound generalized weakness/dehydration: Likely multifactorial but mostly due to dehydration in the context of poor oral intake while she is having left side of neck painful hematoma.  Clinically dry (despite admission BNP of 1300+).  Unfortunately she got a dose of IV Lasix for lower extremity edema last night-hold further Lasix for now.  Started on gentle IV fluids, encourage oral intake and monitor closely.  Head without acute findings.  On admission she was empirically started on antibiotics for presumed pneumonia.  No clinical pneumonia (no respiratory symptoms, fever, chest x-ray likely atelectasis and procalcitonin negative.  Mild leukocytosis may be reactive.  Flu and COVID-19 panel PCR negative) and hence stopped antibiotics and monitor.  Incentive spirometry.  Hypokalemia: Replace and follow.  Magnesium 1.9.  Could be contributing to weakness.  Lower extremity edema, chronic Significantly improved  after IV Lasix in ED.  Low index of suspicion for DVT.  Also if DVT proven, would not be a candidate for anticoagulation currently due to large left side of neck hematoma.  May consider compression stockings.  Paroxysmal atrial fibrillation Rate controlled.  Continue amiodarone.  Anticoagulation currently discontinued due to neck hematoma.  Outpatient follow-up with cardiology.  TTE 04/01/2020: LVEF 55-60% with grade 1 diastolic dysfunction.  Minimally elevated troponin HS, 57> 72, likely demand ischemia in the absence of anginal symptoms.  Secondary hypercoagulable state Secondary to A. Fib.CHA2DS2-VASc Score of 4.   Apixaban (Eliquis) currently on hold.  Left side of neck hematoma/thyroid nodules CT soft tissue of neck 06/24/2020: Large mass left neck measuring 5.5 x 6 cm, most likely hematoma but underlying neoplasm not excluded and follow-up imaging recommended after hematoma resolved.  Multiple thyroid nodules.  Bilateral lung infiltrates, left jugular vein not patent and may be occluded from extrinsic compression by the mass.  Evaluated by ENT/Dr. Benjamine Mola, anticoagulation discontinued, follow-up as outpatient in 3 weeks.  Essential hypertension Mildly uncontrolled.  Continue losartan and as needed hydralazine.  Anemia, suspect of chronic disease Appears to be at baseline.  Physical deconditioning PT recommends home health with 24-hour supervision/assistance versus SNF if 24-hour supervision not available.  Son at bedside indicates that he can arrange the 24-hour supervision and assistance upon discharge  Body mass index is 21.52 kg/m.   DVT prophylaxis: Place and maintain sequential compression device Start: 07/02/20 0855 Place TED hose Start: 07/01/20 1534     Code Status: DNR Family Communication: Discussed in detail with patient's son at bedside, updated care and answered questions Disposition:  Status is: Observation  The patient  will require care spanning > 2 midnights and should  be moved to inpatient because: Inpatient level of care appropriate due to severity of illness  Dispo: The patient is from: Home              Anticipated d/c is to: Home              Anticipated d/c date is: 1 day              Patient currently is not medically stable to d/c.        Consultants:   None  Procedures:   None  Antimicrobials:    Anti-infectives (From admission, onward)   Start     Dose/Rate Route Frequency Ordered Stop   07/01/20 1615  doxycycline (VIBRAMYCIN) 100 mg in sodium chloride 0.9 % 250 mL IVPB        100 mg 125 mL/hr over 120 Minutes Intravenous Every 12 hours 07/01/20 1602     07/01/20 1545  cefTRIAXone (ROCEPHIN) 2 g in sodium chloride 0.9 % 100 mL IVPB        2 g 200 mL/hr over 30 Minutes Intravenous Every 24 hours 07/01/20 1535 07/06/20 1544   07/01/20 1545  azithromycin (ZITHROMAX) 500 mg in sodium chloride 0.9 % 250 mL IVPB  Status:  Discontinued        500 mg 250 mL/hr over 60 Minutes Intravenous Every 24 hours 07/01/20 1535 07/01/20 1602        Subjective:  Patient interviewed and examined along with son at bedside.  Reports 8/10 pain on left side of neck hematoma site.  As per son, hematoma has significantly reduced in size and looks much less "angry".  Patient reports poor oral intake, food provided by one of her friends at home.  Ongoing profound weakness.  Had BM x1 this morning.  Objective:   Vitals:   07/02/20 0235 07/02/20 0241 07/02/20 0503 07/02/20 0827  BP: (!) 177/84  (!) 157/57 (!) 154/78  Pulse: 100  88 87  Resp: 19  20 17   Temp: 98.3 F (36.8 C)  98.2 F (36.8 C) 99.2 F (37.3 C)  TempSrc: Oral  Oral   SpO2: 96%  96% 96%  Weight:  42 kg    Height:        General exam: Pleasant elderly female, small built, frail and chronically ill looking lying uncomfortably propped up in bed.  Oral mucosa dry. Neck: Large golf ball sized soft swelling on left side of base of neck with extensive bruising/ecchymosis extending onto  anterior chest wall/sternum.  Not tender or fluctuant.  Due to swelling and associated pain, patient prefers to keep her head tilted to the right.  No evidence of airway compromise or stridor. Respiratory system: Clear to auscultation. Respiratory effort normal. Cardiovascular system: S1 & S2 heard, RRR. No JVD, murmurs, rubs, gallops or clicks.  Trace left greater than right leg edema.  Although admitted to telemetry currently not on telemetry which was reordered. Gastrointestinal system: Abdomen is nondistended, soft and nontender. No organomegaly or masses felt. Normal bowel sounds heard. Central nervous system: Alert and oriented to person, place and partly to time. No focal neurological deficits. Extremities: Symmetric 5 x 5 power. Skin: No rashes, lesions or ulcers Psychiatry: Judgement and insight appear normal. Mood & affect appropriate.     Data Reviewed:   I have personally reviewed following labs and imaging studies   CBC: Recent Labs  Lab 07/01/20 1115 07/02/20 0413  WBC 14.0*  16.1*  NEUTROABS 12.8*  --   HGB 11.3* 9.2*  HCT 37.2 29.7*  MCV 91.0 89.2  PLT 233 177    Basic Metabolic Panel: Recent Labs  Lab 07/01/20 1115 07/02/20 0413 07/02/20 0838  NA 139 138  --   K 3.9 3.0*  --   CL 99 95*  --   CO2 27 29  --   GLUCOSE 168* 123*  --   BUN 22 15  --   CREATININE 0.59 0.57  --   CALCIUM 8.1* 7.2*  --   MG  --   --  1.9    Liver Function Tests: Recent Labs  Lab 07/01/20 1115  AST 38  ALT 31  ALKPHOS 68  BILITOT 2.4*  PROT 6.2*  ALBUMIN 3.5    CBG: No results for input(s): GLUCAP in the last 168 hours.  Microbiology Studies:   Recent Results (from the past 240 hour(s))  Urine culture     Status: Abnormal   Collection Time: 07/01/20  1:37 PM   Specimen: Urine, Random  Result Value Ref Range Status   Specimen Description URINE, RANDOM  Final   Special Requests   Final    NONE Performed at [facility], [address]  [place], [place] [address]    Culture MULTIPLE SPECIES PRESENT, SUGGEST RECOLLECTION (A)  Final   Report Status 07/02/2020 FINAL  Final  Respiratory Panel by RT PCR (Flu A&B, Covid) - Nasopharyngeal Swab     Status: None   Collection Time: 07/01/20  2:43 PM   Specimen: Nasopharyngeal Swab  Result Value Ref Range Status   SARS Coronavirus 2 by RT PCR NEGATIVE NEGATIVE Final    Comment: (NOTE) SARS-CoV-2 target nucleic acids are NOT DETECTED.  The SARS-CoV-2 RNA is generally detectable in upper respiratoy specimens during the acute phase of infection. The lowest concentration of SARS-CoV-2 viral copies this assay can detect is 131 copies/mL. A negative result does not preclude SARS-Cov-2 infection and should not be used as the sole basis for treatment or other patient management decisions. A negative result may occur with  improper specimen collection/handling, submission of specimen other than nasopharyngeal swab, presence of viral mutation(s) within the areas targeted by this assay, and inadequate number of viral copies (<131 copies/mL). A negative result must be combined with clinical observations, patient history, and epidemiological information. The expected result is Negative.  Fact Sheet for Patients:  PinkCheek.be  Fact Sheet for Healthcare Providers:  GravelBags.it  This test is no t yet approved or cleared by the [place] FDA and  has been authorized for detection and/or diagnosis of SARS-CoV-2 by FDA under an Emergency Use Authorization (EUA). This EUA will remain  in effect (meaning this test can be used) for the duration of the COVID-19 declaration under Section 564(b)(1) of the Act, 21 U.S.C. section 360bbb-3(b)(1), unless the authorization is terminated or revoked sooner.     Influenza A by PCR NEGATIVE NEGATIVE Final   Influenza B by PCR NEGATIVE NEGATIVE Final    Comment: (NOTE) The Xpert Xpress  SARS-CoV-2/FLU/RSV assay is intended as an aid in  the diagnosis of influenza from Nasopharyngeal swab specimens and  should not be used as a sole basis for treatment. Nasal washings and  aspirates are unacceptable for Xpert Xpress SARS-CoV-2/FLU/RSV  testing.  Fact Sheet for Patients: PinkCheek.be  Fact Sheet for Healthcare Providers: GravelBags.it  This test is not yet approved or cleared by the [place] FDA and  has been  authorized for detection and/or diagnosis of SARS-CoV-2 by  FDA under an Emergency Use Authorization (EUA). This EUA will remain  in effect (meaning this test can be used) for the duration of the  Covid-19 declaration under Section 564(b)(1) of the Act, 21  U.S.C. section 360bbb-3(b)(1), unless the authorization is  terminated or revoked. Performed at [facility], [place] [address] [place], [place] [address]      Radiology Studies:  CT Head Wo Contrast  Result Date: 07/01/2020 CLINICAL DATA:  Weakness EXAM: CT HEAD WITHOUT CONTRAST TECHNIQUE: Contiguous axial images were obtained from the base of the skull through the vertex without intravenous contrast. COMPARISON:  CT head dated 12/24/2008 FINDINGS: Brain: No evidence of acute infarction, hemorrhage, hydrocephalus, extra-axial collection or mass lesion/mass effect. Subcortical white matter and periventricular small vessel ischemic changes. Vascular: Intracranial atherosclerosis. Skull: Normal. Negative for fracture or focal lesion. Sinuses/Orbits: The visualized paranasal sinuses are essentially clear. The mastoid air cells are unopacified. Other: None. IMPRESSION: No evidence of acute intracranial abnormality. Small vessel ischemic changes. Electronically Signed   By: Julian Hy M.D.   On: 07/01/2020 14:12   DG Chest Portable 1 View  Result Date: 07/01/2020 CLINICAL DATA:  Weakness EXAM: PORTABLE CHEST 1 VIEW COMPARISON:  05/11/2020.  FINDINGS: The patient is rotated, which somewhat limits evaluation. Similar cardiomediastinal silhouette. Aortic atherosclerosis. Similar small right pleural effusion. Overlying right basilar opacity. Similar elevated right hemidiaphragm. Exaggerated thoracic kyphosis. Clips in the region of the gastroesophageal junction. IMPRESSION: Small right pleural effusion. Overlying right basilar opacity most likely represents atelectasis. Aspiration or pneumonia is not excluded. Electronically Signed   By: Margaretha Sheffield MD   On: 07/01/2020 11:28     Scheduled Meds:   . amiodarone  200 mg Oral Daily  . losartan  100 mg Oral Daily    Continuous Infusions:   . sodium chloride 60 mL/hr at 07/02/20 1005  . cefTRIAXone (ROCEPHIN)  IV Stopped (07/01/20 1655)  . doxycycline (VIBRAMYCIN) IV 100 mg (07/02/20 0240)     LOS: 0 days     Vernell Leep, MD, [place], [facility]. [facility]    To contact the attending provider between 7A-7P or the covering provider during after hours 7P-7A, please log into the web site [email or web address] and access using universal [place] password for that web site. If you do not have the password, please call the hospital operator.  07/02/2020, 1:03 PM

## 2020-07-02 NOTE — Evaluation (Signed)
Physical Therapy Evaluation Patient Details Name: Dawn Hampton MRN: [id number] DOB: [birth year] Today's Date: 07/02/2020   History of Present Illness  Dawn Hampton is a 86 y.o. female with macular degeneration in botht eyes with active choroidal neovascularization, cachexia, A. fib, chronic bilateral lower extremity pitting edema,hypertension, moderate mitral regurgitation and mild aortic regurgitation that present with weakness from appointment at A.fib clinic. Pt has a bulging hematoma/mass on L side of throat th was eval by ENT last weak but has since gotten worse. Pt said there is no intervention at this time.    Clinical Impression  Pt admitted with above. Pt was living alone but now is very deconditioned and weak requiring assist for all mobility and ADLs. Pt with large hematoma on L side of throat that is causing pt some pain. Pt with noted coughing when drinking water, RN notified. Pt aware she is unable to return home alone and plans on talking to son if he can stay with her or she stay with him. Pt assist to [facility], pt had bowel movement but need assist for hygiene. Pt with mild SOB with activity. Pt unable to tolerate ambulation at this time due to weakness and fatigue. Acute PT to cont to follow.    Follow Up Recommendations Home health PT;Supervision/Assistance - 24 hour (will need SNF If 24/7 assist can't be provided)    Equipment Recommendations  Rolling walker with 5" wheels    Recommendations for Other Services       Precautions / Restrictions Precautions Precautions: Fall Precaution Comments: pt very kyphotic, large L side of throat hematoma Restrictions Weight Bearing Restrictions: No      Mobility  Bed Mobility Overal bed mobility: Needs Assistance Bed Mobility: Supine to Sit     Supine to sit: Min assist     General bed mobility comments: minA for trunk elevation, pt able to bring LEs towards EOB, initiated trunk elevation but ultimately needed  assist to bring self to EOB due to weakness    Transfers Overall transfer level: Needs assistance Equipment used: 1 person hand held assist Transfers: Sit to/from [facility] Sit to Stand: Mod assist Stand pivot transfers: Mod assist       General transfer comment: modA to power up, increased time, pt very cautious and guarded with wide base of support, short step height/shuffled gait pattern to stad pvt to [facility] and few steps from [facility] to chair  Ambulation/Gait             General Gait Details: pt limited to 3 steps to chair due to fatigue and weakness  Stairs            Wheelchair Mobility    Modified Rankin (Stroke Patients Only)       Balance Overall balance assessment: Needs assistance Sitting-balance support: Feet supported;No upper extremity supported Sitting balance-Leahy Scale: Fair     Standing balance support: Single extremity supported Standing balance-Leahy Scale: Poor Standing balance comment: depenent on external support                             Pertinent Vitals/Pain Pain Assessment: Faces Faces Pain Scale: Hurts little more Pain Location: hematoma site/chest Pain Descriptors / Indicators: Sore Pain Intervention(s): Monitored during session    Home Living Family/patient expects to be discharged to:: Private residence Living Arrangements: Alone Available Help at Discharge: Family;Available PRN/intermittently (unsure if son can provide 24/7) Type of Home: House Home Access:  Stairs to enter Entrance Stairs-Rails: Right Entrance Stairs-Number of Steps: 1 Home Layout: One level Home Equipment: Cane - quad;Shower seat      Prior Function Level of Independence: Needs assistance   Gait / Transfers Assistance Needed: pt uses quad cane  ADL's / Homemaking Assistance Needed: reports sponge bathing and making simple meals, pt reports friends from church take her to dinner sometimes        Hand Dominance    Dominant Hand: Right    Extremity/Trunk Assessment   Upper Extremity Assessment Upper Extremity Assessment: Generalized weakness    Lower Extremity Assessment Lower Extremity Assessment: Generalized weakness    Cervical / Trunk Assessment Cervical / Trunk Assessment: Kyphotic (extremely kyphotic)  Communication   Communication: No difficulties (soft spoken)  Cognition Arousal/Alertness: Awake/alert Behavior During Therapy: WFL for tasks assessed/performed Overall Cognitive Status: Within Functional Limits for tasks assessed                                 General Comments: pt quiet but oriented, interactive, aware of deficits, precautionary, good safety awareness      General Comments General comments (skin integrity, edema, etc.): pt with large L lateral neck hematoma with bruising down through chest, mild SOB with mobility, +3 pitting edema in bilat LEs (at baseline), pt assisted to [facility], assist for pericare    Exercises     Assessment/Plan    PT Assessment Patient needs continued PT services  PT Problem List Decreased strength;Decreased range of motion;Decreased activity tolerance;Decreased mobility;Decreased balance;Decreased knowledge of use of DME;Decreased coordination       PT Treatment Interventions DME instruction;Gait training;Functional mobility training;Stair training;Therapeutic activities;Therapeutic exercise;Balance training    PT Goals (Current goals can be found in the Care Plan section)  Acute Rehab PT Goals Patient Stated Goal: feel better PT Goal Formulation: With patient Time For Goal Achievement: 07/16/20 Potential to Achieve Goals: Good    Frequency Min 3X/week   Barriers to discharge Decreased caregiver support (unsure if pt could have 24/7 assist)      Co-evaluation               AM-PAC PT "6 Clicks" Mobility  Outcome Measure Help needed turning from your back to your side while in a flat bed without using bedrails?:  A Little Help needed moving from lying on your back to sitting on the side of a flat bed without using bedrails?: A Little Help needed moving to and from a bed to a chair (including a wheelchair)?: A Little Help needed standing up from a chair using your arms (e.g., wheelchair or bedside chair)?: A Little Help needed to walk in hospital room?: A Lot Help needed climbing 3-5 steps with a railing? : A Lot 6 Click Score: 16    End of Session Equipment Utilized During Treatment: Gait belt Activity Tolerance: Patient limited by fatigue Patient left: in chair;with call bell/phone within reach Nurse Communication: Mobility status (coughing with drinking water) PT Visit Diagnosis: Unsteadiness on feet (R26.81);Muscle weakness (generalized) (M62.81);Difficulty in walking, not elsewhere classified (R26.2)    Time: 9983-3825 PT Time Calculation (min) (ACUTE ONLY): 30 min   Charges:   PT Evaluation $PT Eval Moderate Complexity: 1 Mod PT Treatments $Therapeutic Activity: 8-22 mins        Dawn Hampton, PT, DPT Acute Rehabilitation Services Pager #: [phone] Office #: [phone]   Koleen Distance Anastasiya Gowin 07/02/2020, 11:00 AM

## 2020-07-03 ENCOUNTER — Inpatient Hospital Stay (HOSPITAL_COMMUNITY): Payer: Medicare Other

## 2020-07-03 LAB — BASIC METABOLIC PANEL
Anion gap: 10 (ref 5–15)
BUN: 15 mg/dL (ref 8–23)
CO2: 30 mmol/L (ref 22–32)
Calcium: 7.2 mg/dL — ABNORMAL LOW (ref 8.9–10.3)
Chloride: 97 mmol/L — ABNORMAL LOW (ref 98–111)
Creatinine, Ser: 0.65 mg/dL (ref 0.44–1.00)
GFR, Estimated: >60 mL/min (ref >60)
Glucose, Bld: 132 mg/dL — ABNORMAL HIGH (ref 70–99)
Potassium: 4.1 mmol/L (ref 3.5–5.1)
Sodium: 137 mmol/L (ref 135–145)

## 2020-07-03 LAB — CBC
HCT: 31.6 % — ABNORMAL LOW (ref 36.0–46.0)
Hemoglobin: 9.7 g/dL — ABNORMAL LOW (ref 12.0–15.0)
MCH: 27.4 pg (ref 26.0–34.0)
MCHC: 30.7 g/dL (ref 30.0–36.0)
MCV: 89.3 fL (ref 80.0–100.0)
Platelets: 219 10*3/uL (ref 150–400)
RBC: 3.54 MIL/uL — ABNORMAL LOW (ref 3.87–5.11)
RDW: 15.3 % (ref 11.5–15.5)
WBC: 14.6 10*3/uL — ABNORMAL HIGH (ref 4.0–10.5)
nRBC: 0 % (ref 0.0–0.2)

## 2020-07-03 LAB — T3, FREE: T3, Free: 2.2 pg/mL (ref 2.0–4.4)

## 2020-07-03 MED ORDER — METHIMAZOLE 5 MG PO TABS
5.0000 mg | ORAL_TABLET | Freq: Two times a day (BID) | ORAL | Status: DC
  Administered 2020-07-03 – 2020-07-05 (×5): 5 mg via ORAL
  Filled 2020-07-03 (×5): qty 1

## 2020-07-03 MED ORDER — LACTATED RINGERS IV SOLN: INTRAVENOUS | Status: AC

## 2020-07-03 MED ORDER — RESOURCE THICKENUP CLEAR PO POWD
ORAL | Status: DC | PRN
  Filled 2020-07-03: qty 125

## 2020-07-03 MED ORDER — SODIUM CHLORIDE 0.9 % IV SOLN
INTRAVENOUS | Status: DC
Start: 2020-07-03 — End: 2020-07-03

## 2020-07-03 NOTE — Evaluation (Signed)
Clinical/Bedside Swallow Evaluation Patient Details  Name: Dawn Hampton MRN: [id number] Date of Birth: 11-Aug-1922  Today's Date: 07/03/2020 Time: SLP Start Time (ACUTE ONLY): 1009 SLP Stop Time (ACUTE ONLY): 1029 SLP Time Calculation (min) (ACUTE ONLY): 20 min  Past Medical History:  Past Medical History:  Diagnosis Date  . Abdominal mass, right lower quadrant   . Anemia   . Hypertension   . Macular degeneration   . Osteoporosis   . Shingles    T8   Past Surgical History:  Past Surgical History:  Procedure Laterality Date  . cataracts  2004  . PUD, partial gastrectomy 1992    . TOTAL ABDOMINAL HYSTERECTOMY W/ BILATERAL SALPINGOOPHORECTOMY  1975   HPI:  Dawn Hampton is a 86 y.o. female with macular degeneration in both eyes with active choroidal neovascularization, cachexia, A. fib, chronic bilateral lower extremity pitting edema, hypertension, moderate mitral regurgitation and mild aortic regurgitation that present with weakness from appointment at A.fib clinic. Pt has a bulging hematoma/mass on L side of throat that was eval by ENT last weak but has since gotten worse. CXR Small right pleural effusion. Overlying right basilar opacity most likely represents atelectasis. Aspiration or pneumonia is not excluded.Referred by PT who witnessed pt coughing yesterday with water and pt voiced coughing with water last night to PT.   Assessment / Plan / Recommendation Clinical Impression  Pt exhibiting pharyngeal dysphagia in setting of large hematoma on left side neck. Swallow is audible with suspected dyscoordination and compromised tracheal protection. She immediately coughed  with pudding, tsp water trials and protein drink (not nectar consistency although slightly thicker than water). Pt has not had adequate nutrition in several weeks and is fatigued and deconditioned. MBS warranted and scheduled today at 1300 to evaluate and recommend safest solid and liquid consistency.   SLP  Visit Diagnosis: Dysphagia, unspecified (R13.10)    Aspiration Risk       Diet Recommendation Other (Comment) (sips water until MBS)   Liquid Administration via: Spoon    Other  Recommendations Oral Care Recommendations: Oral care BID   Follow up Recommendations Other (comment) (TBD)      Frequency and Duration            Prognosis        Swallow Study   General HPI: Dawn Hampton is a 86 y.o. female with macular degeneration in both eyes with active choroidal neovascularization, cachexia, A. fib, chronic bilateral lower extremity pitting edema, hypertension, moderate mitral regurgitation and mild aortic regurgitation that present with weakness from appointment at A.fib clinic. Pt has a bulging hematoma/mass on L side of throat that was eval by ENT last weak but has since gotten worse. CXR Small right pleural effusion. Overlying right basilar opacity most likely represents atelectasis. Aspiration or pneumonia is not excluded.Referred by PT who witnessed pt coughing yesterday with water and pt voiced coughing with water last night to PT. Type of Study: Bedside Swallow Evaluation Previous Swallow Assessment:  (none) Diet Prior to this Study: Regular;Thin liquids Temperature Spikes Noted: No Respiratory Status: Room air History of Recent Intubation: No Behavior/Cognition: Alert;Cooperative;Other (Comment) (descreased endurance) Oral Cavity Assessment: Within Functional Limits Oral Care Completed by SLP: No Oral Cavity - Dentition: Adequate natural dentition Vision: Functional for self-feeding Self-Feeding Abilities: Able to feed self;Needs set up;Needs assist Patient Positioning: Upright in bed Baseline Vocal Quality: Normal Volitional Cough: Weak Volitional Swallow: Able to elicit    Oral/Motor/Sensory Function Overall Oral Motor/Sensory Function: Within functional limits  Ice Chips Ice chips: Not tested   Thin Liquid Thin Liquid: Impaired Presentation: Spoon Oral  Phase Impairments:  (none) Pharyngeal  Phase Impairments: Cough - Immediate    Nectar Thick Nectar Thick Liquid: Not tested   Honey Thick Honey Thick Liquid: Not tested   Puree Puree: Impaired Presentation: Spoon;Self Fed Oral Phase Impairments:  (none) Pharyngeal Phase Impairments: Cough - Immediate;Cough - Delayed (audible swallow)   Solid     Solid: Not tested      Houston Siren 07/03/2020,10:47 AM  [name] Colvin Caroli.Ed Risk analyst [phone] Office [phone]

## 2020-07-03 NOTE — Plan of Care (Signed)

## 2020-07-03 NOTE — Plan of Care (Signed)
Pt is dependent on all health related needs. Needs therapy.

## 2020-07-03 NOTE — Progress Notes (Addendum)
Initial Nutrition Assessment  DOCUMENTATION CODES:   Severe malnutrition in context of chronic illness  INTERVENTION:  Pt will likely need oral nutrition supplements. Also recommend pt be on as liberalized of a diet as safely possible. Will await results of MBS and make recommendations for supplementation pending the results.   Addendum: Pt placed on dysphagia 1 diet with pudding thick liquids. Unfortunately, there are no oral nutrition supplements in the formulary that are pudding thick.   NUTRITION DIAGNOSIS:   Severe Malnutrition related to chronic illness as evidenced by severe muscle depletion, severe fat depletion.    GOAL:   Patient will meet greater than or equal to 90% of their needs    MONITOR:   PO intake, Supplement acceptance, Weight trends, Labs, I & O's  REASON FOR ASSESSMENT:   Malnutrition Screening Tool    ASSESSMENT:   Pt admitted for weakness which is suspected to be multifactorial but mostly due to dehydration from poor oral intake. 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 Of note, pt has a bulging hematoma/mass in her throat that was evaluated by ENT but pt denied further intervention.  Pt sleeping at time of RD visit and did not wake to RD voice/touch. Per MD documentation, pt reports poor oral intake and that her food is provided by one of her friends at home. Unable to obtain any additional diet history at this time. Question if pt has had an element of food insecurity.   SLP evaluated pt and noted pharyngeal dysphagia in setting of large hematoma on L side of neck. Per SLP, MBS is warranted and one has been scheduled for today at 1300. Will await results of MBS prior to making oral nutrition supplement recommendations.   Wt stable over last year.   PO Intake: 50% x 1 recorded meal  UOP:  380ml x24 hours  Labs and medications reviewed.  NUTRITION - FOCUSED PHYSICAL EXAM:    Most Recent Value  Orbital Region Moderate depletion  Upper Arm Region Severe depletion  Thoracic and Lumbar Region Severe depletion  Buccal Region Severe depletion  Temple Region Mild depletion  Clavicle Bone Region Severe depletion  Clavicle and Acromion Bone Region Severe depletion  Scapular Bone Region Severe depletion  Dorsal Hand Severe depletion  Patellar Region Severe depletion  Anterior Thigh Region Severe depletion  Posterior Calf Region Severe depletion  Edema (RD Assessment) None  Hair Reviewed  Eyes Reviewed  Mouth Reviewed  Skin Reviewed  Nails Reviewed       Diet Order:   Diet Order            Diet Heart Room service appropriate? Yes; Fluid consistency: Thin  Diet effective now                 EDUCATION NEEDS:   Not appropriate for education at this time  Skin:  Skin Assessment: Reviewed RN Assessment  Last BM:  11/2 (per MD note)  Height:   Ht Readings from Last 1 Encounters:  07/01/20 4\' 7"  (1.397 m)    Weight:   Wt Readings from Last 1 Encounters:  07/02/20 42 kg   BMI:  Body mass index is 21.52 kg/m.  Estimated Nutritional Needs:   Kcal:  1250-1450  Protein:  65-75 grams  Fluid:  >/=1.25L/d    Larkin Ina, MS, RD, LDN RD pager number and weekend/on-call pager number located in [place].

## 2020-07-03 NOTE — Progress Notes (Addendum)
Physical Therapy Treatment Patient Details Name: Dawn Hampton MRN: [id number] DOB: 05/07/1922 Today's Date: 07/03/2020    History of Present Illness Dawn Hampton is a 86 y.o. female with macular degeneration in botht eyes with active choroidal neovascularization, cachexia, A. fib, chronic bilateral lower extremity pitting edema,hypertension, moderate mitral regurgitation and mild aortic regurgitation that present with weakness from appointment at A.fib clinic. Pt has a bulging hematoma/mass on L side of throat th was eval by ENT last weak but has since gotten worse. Pt said there is no intervention at this time.    PT Comments    Pt much improved from energy level and strength and was able to ambulate with RW and minA. Pt remains deconditioned compared to baseline but much improved from yesterday. Pt aware she is still not safe to be home alone. Pt also with difficulty swallowing thin liquids and states "I strangled on water last night." Asked RN to request a SLP swallow eval. Son present and aware as well and states he is trying to come up with 24/7 assist. Acute PT to cont to follow.    Follow Up Recommendations  Home health PT;Supervision/Assistance - 24 hour     Equipment Recommendations  Rolling walker with 5" wheels    Recommendations for Other Services       Precautions / Restrictions Precautions Precautions: Fall Precaution Comments: pt very kyphotic, large L side of throat hematoma Restrictions Weight Bearing Restrictions: No    Mobility  Bed Mobility Overal bed mobility: Needs Assistance Bed Mobility: Sit to Supine       Sit to supine: Min assist   General bed mobility comments: minA for LE managment and to scoot up in bed, pt unable to tolerate HOB flat due chest pain and mild SOB  Transfers Overall transfer level: Needs assistance Equipment used: 1 person hand held assist Transfers: Sit to/from [facility] Sit to Stand: Min  assist Stand pivot transfers: Mod assist       General transfer comment: pt able to stand well with pushing up from bilat arm rests, modA to steady during transition of hands to RW due to retoropulsion, modA for std pvt to steady and help advance LEs during std pvt  Ambulation/Gait Ambulation/Gait assistance: Min assist;+2 physical assistance Gait Distance (Feet): 50 Feet Assistive device: Rolling walker (2 wheeled) Gait Pattern/deviations: Step-through pattern;Decreased stride length;Trunk flexed Gait velocity: slow   General Gait Details: pt requiring minA to support trunk to optimize upright position, pt with short shuffled steps   Stairs             Wheelchair Mobility    Modified Rankin (Stroke Patients Only)       Balance Overall balance assessment: Needs assistance Sitting-balance support: Feet supported;No upper extremity supported Sitting balance-Leahy Scale: Fair     Standing balance support: Single extremity supported Standing balance-Leahy Scale: Poor Standing balance comment: depenent on external support                            Cognition Arousal/Alertness: Awake/alert Behavior During Therapy: WFL for tasks assessed/performed Overall Cognitive Status: Within Functional Limits for tasks assessed                                 General Comments: pt oriented, anxious over drinking water reports "I was strangled by water last night"  Exercises      General Comments General comments (skin integrity, edema, etc.): pt with noted difficulty swallowing water and lots of coughing, request SLP consult from RN      Pertinent Vitals/Pain Pain Assessment: Faces Faces Pain Scale: Hurts little more Pain Location: hematoma site/chest Pain Descriptors / Indicators: Sore Pain Intervention(s): Monitored during session    Home Living                      Prior Function            PT Goals (current goals can now  be found in the care plan section) Progress towards PT goals: Progressing toward goals    Frequency    Min 3X/week      PT Plan Current plan remains appropriate    Co-evaluation              AM-PAC PT "6 Clicks" Mobility   Outcome Measure  Help needed turning from your back to your side while in a flat bed without using bedrails?: A Little Help needed moving from lying on your back to sitting on the side of a flat bed without using bedrails?: A Little Help needed moving to and from a bed to a chair (including a wheelchair)?: A Little Help needed standing up from a chair using your arms (e.g., wheelchair or bedside chair)?: A Little Help needed to walk in hospital room?: A Little Help needed climbing 3-5 steps with a railing? : A Lot 6 Click Score: 17    End of Session Equipment Utilized During Treatment: Gait belt Activity Tolerance: Patient limited by fatigue Patient left: in bed;with call bell/phone within reach;with family/visitor present Nurse Communication: Mobility status PT Visit Diagnosis: Unsteadiness on feet (R26.81);Muscle weakness (generalized) (M62.81);Difficulty in walking, not elsewhere classified (R26.2)     Time: 8341-9622 PT Time Calculation (min) (ACUTE ONLY): 26 min  Charges:  $Gait Training: 8-22 mins $Therapeutic Activity: 8-22 mins                     Kittie Plater, PT, DPT Acute Rehabilitation Services Pager #: [phone] Office #: [phone]    Berline Lopes 07/03/2020, 9:49 AM

## 2020-07-03 NOTE — Progress Notes (Addendum)
Modified Barium Swallow Progress Note  Patient Details  Name: Jupiter Boys MRN: [id number] Date of Birth: 1921-11-27  Today's Date: 07/03/2020  Modified Barium Swallow completed.  Full report located under Chart Review in the Imaging Section.  Brief recommendations include the following:  Clinical Impression  Pt exhibits significant pharyngeal dysphagia exacerbated by left neck hematoma, kyphosis and weakened state with malnourishment for several weeks in 86 yr old patient. Her hyoid movement forward and anteriorally is significantly decreased with mostly patent laryngeal vestibule from minimal epiglottic inversion. Honey thick barium was aspirated during swallows followed by timely reflexive coughs unable to clear aspirate. Chin tuck then left head turn trialed seperately both non beneficial for laryngeal closure. Residue in valleculae was only minimal. Bolus moved through UES with intermittent residue above UES. As neck edema decreases, endurance increases goal is for return to unrestricted po's. Recommending puree (D1), pudding thick liquids and crushed meds. Asked pt to perform volitional cough after every 3 bites. Will consider initiation of water protocol as pt tolerates.    Swallow Evaluation Recommendations       SLP Diet Recommendations: Pudding thick liquid;Dysphagia 1 (Puree) solids       Medication Administration: Crushed with puree   Supervision: Patient able to self feed;Full supervision/cueing for compensatory strategies   Compensations: Clear throat intermittently;Other (Comment) (cough after every 3 bites)   Postural Changes: Seated upright at 90 degrees   Oral Care Recommendations: Oral care BID   Other Recommendations: Order thickener from pharmacy    Houston Siren 07/03/2020,2:46 PM  [name] Colvin Caroli.Ed Risk analyst [phone] Office [phone]

## 2020-07-03 NOTE — Progress Notes (Signed)
PROGRESS NOTE    Dawn Hampton  LOV:[id number] DOB: 16-Sep-1921 DOA: 07/01/2020 PCP: Leanna Battles, MD   Chief Complain: Weakness  Brief Narrative: Patient is a 86 year old female who lives alone and ambulates with the help of cane, past medical history of paroxysmal A. fib, hypertension, valvular heart disease: Moderate MR, macular degeneration, chronic bilateral lower extremity pitting edema, recent neck hematoma who was sent to the emergency department from [facility] for the evaluation of poor oral intake, weakness, unsteady gait, poor oral intake.  Hospital course remained stable.  Anticoagulation was stopped due to finding of neck hematoma.  PT/OT recommended home health but she lives alone and family and patient are interested on skilled nursing facility.  TOC consulted.  Assessment & Plan:   Active Problems:   Pneumonia   Generalized weakness  Profound weakness/dehydration/debility/deconditioning: Most likely multifactorial.  She had poor mobility, poor oral intake, weakness.  Found to be dehydrated on presentation.  She was also given some antibiotics for possible pneumonia but now antibiotics discontinued.  PT/OT recommended home health but she lives alone so the family and patient are interested on skilled nursing facility temporarily.  TOC consulted  Left-sided neck hematoma/thyroid nodules/hypothyroidism: CT soft tissue showed hematoma in the left neck but underlying neoplasm not excluded so follow-up imaging is recommended after hematoma resolves. She was on Eliquis at home for anticoagulation which has been stopped and most likely this will be stopped permanently but we recommend to follow-up with cardiology as an outpatient. Neck hematoma size is improving.  Hyperthyroidism: Ultrasound of the thyroid gland done recently showed 1.4 cm thyroid nodule in the isthmus.  Elevated T4, low TSH consistent with hyperthyroidism.  Discussed with endocrinology Dr. Buddy Duty  07/03/2020.  Started on methimazole 5 mg twice a day.  She will follow-up with Dr. Buddy Duty as an outpatient.  Lower extremity edema: Chronic.  Improved after dose of Lasix that was given in the emergency department.  Paroxysmal A. fib: Currently rate is controlled.  Continue amiodarone.  Anticoagulation discontinued due to finding of neck hematoma. TTE done 04/01/2020: LVEF 55-60% with grade 1 diastolic dysfunction.   Follows with A. fib clinic.  Elevated BNP on presentation but clinically she appeared dehydrated  Hypertension: Currently blood pressure acceptable.  Continue losartan.  Normocytic anemia: Most likely secondary to chronic medical conditions.  Hemoglobin at baseline.  Dysphagia: Speech following.  Plan for MBS.  Leukocytosis: Most likely reactive.  Continue to monitor.  Low suspicion for infectious etiology.  Elevated troponin: Nonspecific finding, flat trend.  Does not complain of any chest pain                DVT prophylaxis:SCD Code Status: DNR Family Communication: Son at bedside Status is: Inpatient  Remains inpatient appropriate because:Unsafe d/c plan   Dispo:  Patient From: Home  Planned Disposition: SNF  Expected discharge date: 2-3 days  Medically stable for discharge:yes     Consultants: None  Procedures:None  Antimicrobials:  Anti-infectives (From admission, onward)   Start     Dose/Rate Route Frequency Ordered Stop   07/01/20 1615  doxycycline (VIBRAMYCIN) 100 mg in sodium chloride 0.9 % 250 mL IVPB  Status:  Discontinued        100 mg 125 mL/hr over 120 Minutes Intravenous Every 12 hours 07/01/20 1602 07/02/20 1332   07/01/20 1545  cefTRIAXone (ROCEPHIN) 2 g in sodium chloride 0.9 % 100 mL IVPB  Status:  Discontinued        2 g 200 mL/hr over  30 Minutes Intravenous Every 24 hours 07/01/20 1535 07/02/20 1332   07/01/20 1545  azithromycin (ZITHROMAX) 500 mg in sodium chloride 0.9 % 250 mL IVPB  Status:  Discontinued        500 mg 250  mL/hr over 60 Minutes Intravenous Every 24 hours 07/01/20 1535 07/01/20 1602      Subjective:  Patient seen and examined at the bedside this morning.  During my evaluation, she was comfortable and was being evaluated by speech therapy.  Son at the bedside.  Was having some cough but overall stable.  Patient and the son anxious about going home because she lives alone.  Objective: Vitals:   07/02/20 0827 07/02/20 1743 07/02/20 2113 07/03/20 0616  BP: (!) 154/78 (!) 155/65 (!) 149/62 (!) 142/76  Pulse: 87 88 94 89  Resp: 17 15 17 18   Temp: 99.2 F (37.3 C) 99.2 F (37.3 C) 98.2 F (36.8 C) 98.4 F (36.9 C)  TempSrc:   Oral Oral  SpO2: 96% 96% 96% 94%  Weight:      Height:        Intake/Output Summary (Last 24 hours) at 07/03/2020 1038 Last data filed at 07/03/2020 0500 Gross per 24 hour  Intake 840.59 ml  Output 300 ml  Net 540.59 ml   Filed Weights   07/01/20 1045 07/02/20 0241  Weight: 42.5 kg 42 kg    Examination:  General exam: Very deconditioned, debilitated, pleasant elderly female HEENT: Swelling of left neck, presence of tender/hard mass on the left side of the neck Respiratory system: Bilateral equal air entry, normal vesicular breath sounds, no wheezes or crackles  Cardiovascular system: S1 & S2 heard, RRR. No JVD, murmurs, rubs, gallops or clicks. No pedal edema.  Ecchymosis on the chest Gastrointestinal system: Abdomen is nondistended, soft and nontender. No organomegaly or masses felt. Normal bowel sounds heard. Central nervous system: Alert and oriented. No focal neurological deficits. Extremities: No edema, no clubbing ,no cyanosis Skin: Ecchymosis on the chest, no lesions or ulcers,no icterus ,no pallor   Data Reviewed: I have personally reviewed following labs and imaging studies  CBC: Recent Labs  Lab 07/01/20 1115 07/02/20 0413 07/03/20 0154  WBC 14.0* 16.1* 14.6*  NEUTROABS 12.8*  --   --   HGB 11.3* 9.2* 9.7*  HCT 37.2 29.7* 31.6*  MCV  91.0 89.2 89.3  PLT 233 194 326   Basic Metabolic Panel: Recent Labs  Lab 07/01/20 1115 07/02/20 0413 07/02/20 0838 07/03/20 0154  NA 139 138  --  137  K 3.9 3.0*  --  4.1  CL 99 95*  --  97*  CO2 27 29  --  30  GLUCOSE 168* 123*  --  132*  BUN 22 15  --  15  CREATININE 0.59 0.57  --  0.65  CALCIUM 8.1* 7.2*  --  7.2*  MG  --   --  1.9  --    GFR: Estimated Creatinine Clearance: 24.7 mL/min (by C-G formula based on SCr of 0.65 mg/dL). Liver Function Tests: Recent Labs  Lab 07/01/20 1115  AST 38  ALT 31  ALKPHOS 68  BILITOT 2.4*  PROT 6.2*  ALBUMIN 3.5   No results for input(s): LIPASE, AMYLASE in the last 168 hours. No results for input(s): AMMONIA in the last 168 hours. Coagulation Profile: No results for input(s): INR, PROTIME in the last 168 hours. Cardiac Enzymes: No results for input(s): CKTOTAL, CKMB, CKMBINDEX, TROPONINI in the last 168 hours. BNP (last 3 results)  No results for input(s): PROBNP in the last 8760 hours. HbA1C: No results for input(s): HGBA1C in the last 72 hours. CBG: No results for input(s): GLUCAP in the last 168 hours. Lipid Profile: No results for input(s): CHOL, HDL, LDLCALC, TRIG, CHOLHDL, LDLDIRECT in the last 72 hours. Thyroid Function Tests: Recent Labs    07/01/20 1722 07/02/20 0838  TSH 0.026*  --   FREET4  --  3.08*  T3FREE  --  2.2   Anemia Panel: No results for input(s): VITAMINB12, FOLATE, FERRITIN, TIBC, IRON, RETICCTPCT in the last 72 hours. Sepsis Labs: Recent Labs  Lab 07/02/20 0838  PROCALCITON <0.10    Recent Results (from the past 240 hour(s))  Urine culture     Status: Abnormal   Collection Time: 07/01/20  1:37 PM   Specimen: Urine, Random  Result Value Ref Range Status   Specimen Description URINE, RANDOM  Final   Special Requests   Final    NONE Performed at [facility], [address], [place], [place] [address]    Culture MULTIPLE SPECIES PRESENT, SUGGEST RECOLLECTION (A)  Final    Report Status 07/02/2020 FINAL  Final  Respiratory Panel by RT PCR (Flu A&B, Covid) - Nasopharyngeal Swab     Status: None   Collection Time: 07/01/20  2:43 PM   Specimen: Nasopharyngeal Swab  Result Value Ref Range Status   SARS Coronavirus 2 by RT PCR NEGATIVE NEGATIVE Final    Comment: (NOTE) SARS-CoV-2 target nucleic acids are NOT DETECTED.  The SARS-CoV-2 RNA is generally detectable in upper respiratoy specimens during the acute phase of infection. The lowest concentration of SARS-CoV-2 viral copies this assay can detect is 131 copies/mL. A negative result does not preclude SARS-Cov-2 infection and should not be used as the sole basis for treatment or other patient management decisions. A negative result may occur with  improper specimen collection/handling, submission of specimen other than nasopharyngeal swab, presence of viral mutation(s) within the areas targeted by this assay, and inadequate number of viral copies (<131 copies/mL). A negative result must be combined with clinical observations, patient history, and epidemiological information. The expected result is Negative.  Fact Sheet for Patients:  PinkCheek.be  Fact Sheet for Healthcare Providers:  GravelBags.it  This test is no t yet approved or cleared by the [place] FDA and  has been authorized for detection and/or diagnosis of SARS-CoV-2 by FDA under an Emergency Use Authorization (EUA). This EUA will remain  in effect (meaning this test can be used) for the duration of the COVID-19 declaration under Section 564(b)(1) of the Act, 21 U.S.C. section 360bbb-3(b)(1), unless the authorization is terminated or revoked sooner.     Influenza A by PCR NEGATIVE NEGATIVE Final   Influenza B by PCR NEGATIVE NEGATIVE Final    Comment: (NOTE) The Xpert Xpress SARS-CoV-2/FLU/RSV assay is intended as an aid in  the diagnosis of influenza from Nasopharyngeal swab  specimens and  should not be used as a sole basis for treatment. Nasal washings and  aspirates are unacceptable for Xpert Xpress SARS-CoV-2/FLU/RSV  testing.  Fact Sheet for Patients: PinkCheek.be  Fact Sheet for Healthcare Providers: GravelBags.it  This test is not yet approved or cleared by the [place] FDA and  has been authorized for detection and/or diagnosis of SARS-CoV-2 by  FDA under an Emergency Use Authorization (EUA). This EUA will remain  in effect (meaning this test can be used) for the duration of the  Covid-19 declaration under Section 564(b)(1) of  the Act, 21  U.S.C. section 360bbb-3(b)(1), unless the authorization is  terminated or revoked. Performed at [facility], [place] [address], [place], [place] [address]          Radiology Studies: CT Head Wo Contrast  Result Date: 07/01/2020 CLINICAL DATA:  Weakness EXAM: CT HEAD WITHOUT CONTRAST TECHNIQUE: Contiguous axial images were obtained from the base of the skull through the vertex without intravenous contrast. COMPARISON:  CT head dated 12/24/2008 FINDINGS: Brain: No evidence of acute infarction, hemorrhage, hydrocephalus, extra-axial collection or mass lesion/mass effect. Subcortical white matter and periventricular small vessel ischemic changes. Vascular: Intracranial atherosclerosis. Skull: Normal. Negative for fracture or focal lesion. Sinuses/Orbits: The visualized paranasal sinuses are essentially clear. The mastoid air cells are unopacified. Other: None. IMPRESSION: No evidence of acute intracranial abnormality. Small vessel ischemic changes. Electronically Signed   By: Julian Hy M.D.   On: 07/01/2020 14:12   DG Chest Portable 1 View  Result Date: 07/01/2020 CLINICAL DATA:  Weakness EXAM: PORTABLE CHEST 1 VIEW COMPARISON:  05/11/2020. FINDINGS: The patient is rotated, which somewhat limits evaluation. Similar cardiomediastinal  silhouette. Aortic atherosclerosis. Similar small right pleural effusion. Overlying right basilar opacity. Similar elevated right hemidiaphragm. Exaggerated thoracic kyphosis. Clips in the region of the gastroesophageal junction. IMPRESSION: Small right pleural effusion. Overlying right basilar opacity most likely represents atelectasis. Aspiration or pneumonia is not excluded. Electronically Signed   By: Margaretha Sheffield MD   On: 07/01/2020 11:28        Scheduled Meds: . amiodarone  200 mg Oral Daily  . losartan  100 mg Oral Daily  . methimazole  5 mg Oral BID   Continuous Infusions:   LOS: 1 day    Time spent: 25 mins.More than 50% of that time was spent in counseling and/or coordination of care.      Shelly Coss, MD [facility] P11/10/2019, 10:38 AM

## 2020-07-04 ENCOUNTER — Inpatient Hospital Stay (HOSPITAL_COMMUNITY): Payer: Medicare Other

## 2020-07-04 LAB — CBC WITH DIFFERENTIAL/PLATELET
Abs Immature Granulocytes: 0.07 10*3/uL (ref 0.00–0.07)
Basophils Absolute: 0 10*3/uL (ref 0.0–0.1)
Basophils Relative: 0 %
Eosinophils Absolute: 0.1 10*3/uL (ref 0.0–0.5)
Eosinophils Relative: 1 %
HCT: 31.3 % — ABNORMAL LOW (ref 36.0–46.0)
Hemoglobin: 9.5 g/dL — ABNORMAL LOW (ref 12.0–15.0)
Immature Granulocytes: 0 %
Lymphocytes Relative: 5 %
Lymphs Abs: 0.8 10*3/uL (ref 0.7–4.0)
MCH: 27.3 pg (ref 26.0–34.0)
MCHC: 30.4 g/dL (ref 30.0–36.0)
MCV: 89.9 fL (ref 80.0–100.0)
Monocytes Absolute: 1.5 10*3/uL — ABNORMAL HIGH (ref 0.1–1.0)
Monocytes Relative: 9 %
Neutro Abs: 13.8 10*3/uL — ABNORMAL HIGH (ref 1.7–7.7)
Neutrophils Relative %: 85 %
Platelets: 199 10*3/uL (ref 150–400)
RBC: 3.48 MIL/uL — ABNORMAL LOW (ref 3.87–5.11)
RDW: 15.4 % (ref 11.5–15.5)
WBC: 16.3 10*3/uL — ABNORMAL HIGH (ref 4.0–10.5)
nRBC: 0 % (ref 0.0–0.2)

## 2020-07-04 LAB — SARS CORONAVIRUS 2 BY RT PCR (HOSPITAL ORDER, PERFORMED IN ~~LOC~~ HOSPITAL LAB): SARS Coronavirus 2: NEGATIVE

## 2020-07-04 MED ORDER — ACETAMINOPHEN 325 MG PO TABS
650.0000 mg | ORAL_TABLET | ORAL | Status: DC | PRN
  Administered 2020-07-04 (×2): 650 mg via ORAL
  Filled 2020-07-04 (×2): qty 2

## 2020-07-04 NOTE — TOC Initial Note (Addendum)
Transition of Care [facility]) - Initial/Assessment Note    Patient Details  Name: Dawn Hampton MRN: [id number] Date of Birth: 12-Oct-1921  Transition of Care [facility]) CM/SW Contact:    Joanne Chars, LCSW Phone Number: 07/04/2020, 10:07 AM  Clinical Narrative: CSW met with pt to discuss discharge plan.  Pt agreeable to SNF placement.  Permission given to speak with son Legrand Como and to send out information for SNF referral.  Choice document given.  Pt is vaccinated.            1015: LM for son Ronalee Belts.      1535: Pt accepted at [facility] and chooses that facility.  Son informed as well.  [facility] can take on Friday, 11/5.      Expected Discharge Plan: Skilled Nursing Facility Barriers to Discharge: Continued Medical Work up, SNF Pending bed offer   Patient Goals and CMS Choice Patient states their goals for this hospitalization and ongoing recovery are:: "get back home" CMS Medicare.gov Compare Post Acute Care list provided to:: Patient Choice offered to / list presented to : Patient  Expected Discharge Plan and Services Expected Discharge Plan: [place] Choice: [place] arrangements for the past 2 months: Single Family Home                                      Prior Living Arrangements/Services Living arrangements for the past 2 months: Single Family Home Lives with:: Self Patient language and need for interpreter reviewed:: Yes Do you feel safe going back to the place where you live?: No   Pt knows she needs SNF at this time.  Need for Family Participation in Patient Care: Yes (Comment) Care giver support system in place?: Yes (comment)   Criminal Activity/Legal Involvement Pertinent to Current Situation/Hospitalization: No - Comment as needed  Activities of Daily Living Home Assistive Devices/Equipment: Cane (specify quad or straight) ADL Screening (condition at time of admission) Patient's cognitive  ability adequate to safely complete daily activities?: Yes Is the patient deaf or have difficulty hearing?: No Does the patient have difficulty seeing, even when wearing glasses/contacts?: No Does the patient have difficulty concentrating, remembering, or making decisions?: No Patient able to express need for assistance with ADLs?: Yes Does the patient have difficulty dressing or bathing?: No Independently performs ADLs?: Yes (appropriate for developmental age) Does the patient have difficulty walking or climbing stairs?: Yes Weakness of Legs: Both Weakness of Arms/Hands: Both  Permission Sought/Granted Permission sought to share information with : Family Supports, Chartered certified accountant granted to share information with : Yes, Verbal Permission Granted  Share Information with NAME: son Legrand Como  Permission granted to share info w AGENCY: SNF        Emotional Assessment Appearance:: Appears stated age Attitude/Demeanor/Rapport: Engaged Affect (typically observed): Appropriate, Pleasant Orientation: : Oriented to Self, Oriented to Place, Oriented to  Time, Oriented to Situation Alcohol / Substance Use: Not Applicable Psych Involvement: No (comment)  Admission diagnosis:  Pneumonia [J18.9] Weakness [R53.1] Elevated troponin [R77.8] Acute on chronic congestive heart failure, unspecified heart failure type ([place]) [I50.9] Generalized weakness [R53.1] Patient Active Problem List   Diagnosis Date Noted  . Generalized weakness 07/02/2020  . Pneumonia 07/01/2020  . Paroxysmal atrial fibrillation ([place]) 02/29/2020  . Secondary hypercoagulable state ([place]) 02/29/2020  . Exudative age-related macular degeneration of right eye with active choroidal  neovascularization ([place]) 12/11/2019  . 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 Advanced nonexudative age-related macular degeneration of right eye with  subfoveal involvement 12/11/2019  . Exudative age-related macular degeneration of left eye with active choroidal neovascularization ([place]) 12/11/2019  . Cystoid macular edema of left eye 12/11/2019  . Posterior vitreous detachment of left eye 12/11/2019   PCP:  Leanna Battles, MD Pharmacy:   [id number] [place], [place] [id number]: [phone]: [phone] [facility] [id number]: [phone]: [phone]    Social Determinants of Health (SDOH) Interventions    Readmission Risk Interventions No flowsheet data found.

## 2020-07-04 NOTE — Progress Notes (Addendum)
  Speech Language Pathology Treatment: Dysphagia  Patient Details Name: Dawn Hampton MRN: [id number] DOB: 1922/04/04 Today's Date: 07/04/2020 Time: 3016-0109 SLP Time Calculation (min) (ACUTE ONLY): 15 min  Assessment / Plan / Recommendation Clinical Impression  Pt looks weak and fatigued today; coughing with tech during breakfast and with this therapist while consuming magic cup ice cream. Cough appeared reflexive and not intentional as part of strategy. She had laryngeal penetration with puree intermittently during MBS yesterday and would expect pt to have episodic aspiration despite diet modifications. Discussed this with pt and son when he arrived. Prognosis for return of safe swallow is concerning. She is likely not going to meet her nutrition and hydration needs. Discussed water protocol (allowing thin water only after oral care and in between meals) with RN and briefly pt/family yesterday- will order. Water may not be pleasing to her due to severity of cough that ensues. Therapist briefly mentioned Palliative care and benefits if they are receptive.  Explained that her sensation and reflexive cough is an asset in attempts to clear at least partially.    HPI HPI: Dawn Hampton Dawn Hampton is a 86 y.o. female with macular degeneration in both eyes with active choroidal neovascularization, cachexia, A. fib, chronic bilateral lower extremity pitting edema, hypertension, moderate mitral regurgitation and mild aortic regurgitation that present with weakness from appointment at A.fib clinic. Pt has a bulging hematoma/mass on L side of throat that was eval by ENT last weak but has since gotten worse. CXR Small right pleural effusion. Overlying right basilar opacity most likely represents atelectasis. Aspiration or pneumonia is not excluded.Referred by PT who witnessed pt coughing yesterday with water and pt voiced coughing with water last night to PT.      SLP Plan  Continue with current plan of  care       Recommendations  Diet recommendations: Dysphagia 1 (puree);Pudding-thick liquid Compensations: Clear throat intermittently;Other (Comment) Postural Changes and/or Swallow Maneuvers: Seated upright 90 degrees;Upright 30-60 min after meal                Oral Care Recommendations: Oral care BID Follow up Recommendations: Skilled Nursing facility SLP Visit Diagnosis: Dysphagia, pharyngoesophageal phase (R13.14) Plan: Continue with current plan of care                       Dawn Hampton 07/04/2020, 10:30 AM  Dawn Hampton Dawn Hampton.Ed Risk analyst [phone] Office [phone]

## 2020-07-04 NOTE — Progress Notes (Signed)
PROGRESS NOTE    Dawn Hampton  GYI:[id number] DOB: [birth year] DOA: 07/01/2020 PCP: Leanna Battles, MD   Chief Complain: Weakness  Brief Narrative: Patient is a 86 year old female who lives alone and ambulates with the help of cane, past medical history of paroxysmal A. fib, hypertension, valvular heart disease: Moderate MR, macular degeneration, chronic bilateral lower extremity pitting edema, recent neck hematoma who was sent to the emergency department from [facility] clinic for the evaluation of poor oral intake, weakness, unsteady gait, poor oral intake.  Hospital course remained stable.  Anticoagulation was stopped due to finding of neck hematoma.  PT/OT recommended home health but she lives alone and family and patient are interested on skilled nursing facility.  TOC consulted.  Assessment & Plan:   Active Problems:   Pneumonia   Generalized weakness  Profound weakness/dehydration/debility/deconditioning: Most likely multifactorial.  She had poor mobility, poor oral intake, weakness.  Found to be dehydrated on presentation.  She was also given some antibiotics for possible pneumonia but now antibiotics discontinued.  PT/OT recommended home health but she lives alone so the family and patient are interested on skilled nursing facility temporarily.  TOC consulted  Left-sided neck hematoma/thyroid nodules/hypothyroidism: CT soft tissue showed hematoma in the left neck but underlying neoplasm not excluded so follow-up imaging is recommended after hematoma resolves. She was on Eliquis at home for anticoagulation which has been stopped and most likely this will be stopped permanently but we recommend to follow-up with cardiology as an outpatient. Neck hematoma size is improving.  She complains of persistent pain on the left neck so I have ordered ultrasound of the neck for follow-up evaluation.  Hyperthyroidism: Ultrasound of the thyroid gland done recently showed 1.4 cm thyroid nodule  in the isthmus.  Elevated T4, low TSH consistent with hyperthyroidism.  Discussed with endocrinology Dr. Buddy Duty 07/03/2020.  Started on methimazole 5 mg twice a day.  She will follow-up with Dr. Buddy Duty as an outpatient.  Lower extremity edema: Chronic.  Improved after a dose of Lasix that was given in the emergency department.  Paroxysmal A. fib: Currently rate is controlled.  Continue amiodarone.  Anticoagulation discontinued due to finding of neck hematoma. TTE done 04/01/2020: LVEF 55-60% with grade 1 diastolic dysfunction.   Follows with A. fib clinic.  Elevated BNP on presentation but clinically she appeared dehydrated  Hypertension: Currently blood pressure acceptable.  Continue losartan.  Normocytic anemia: Most likely secondary to chronic medical conditions.  Hemoglobin at baseline.  Dysphagia: Speech following.  On dysphagia 1 diet.  Leukocytosis: Most likely reactive.  Continue to monitor.  Low suspicion for infectious etiology.  Elevated troponin: Nonspecific finding, flat trend.  Does not complain of any chest pain         Nutrition Problem: Severe Malnutrition Etiology: chronic illness      DVT prophylaxis:SCD Code Status: DNR Family Communication: Son on phone on 07/04/20 Status is: Inpatient  Remains inpatient appropriate because:Unsafe d/c plan   Dispo:  Patient From: Home  Planned Disposition: SNF  Expected discharge date: 2-3 days  Medically stable for discharge:yes     Consultants: None  Procedures:None  Antimicrobials:  Anti-infectives (From admission, onward)   Start     Dose/Rate Route Frequency Ordered Stop   07/01/20 1615  doxycycline (VIBRAMYCIN) 100 mg in sodium chloride 0.9 % 250 mL IVPB  Status:  Discontinued        100 mg 125 mL/hr over 120 Minutes Intravenous Every 12 hours 07/01/20 1602 07/02/20 1332   07/01/20  1545  cefTRIAXone (ROCEPHIN) 2 g in sodium chloride 0.9 % 100 mL IVPB  Status:  Discontinued        2 g 200 mL/hr over 30  Minutes Intravenous Every 24 hours 07/01/20 1535 07/02/20 1332   07/01/20 1545  azithromycin (ZITHROMAX) 500 mg in sodium chloride 0.9 % 250 mL IVPB  Status:  Discontinued        500 mg 250 mL/hr over 60 Minutes Intravenous Every 24 hours 07/01/20 1535 07/01/20 1602      Subjective:  Patient seen and examined at the bedside this morning.  Hemodynamically stable but appears weak and anxious today.  Denies any new complaints besides left-sided neck pain.  Objective: Vitals:   07/02/20 2113 07/03/20 0616 07/03/20 2034 07/04/20 0529  BP: (!) 149/62 (!) 142/76 (!) 150/74 139/75  Pulse: 94 89 92 87  Resp: 17 18 14 19   Temp: 98.2 F (36.8 C) 98.4 F (36.9 C) 98.8 F (37.1 C) 99 F (37.2 C)  TempSrc: Oral Oral  Oral  SpO2: 96% 94% 94% 95%  Weight:      Height:        Intake/Output Summary (Last 24 hours) at 07/04/2020 0815 Last data filed at 07/04/2020 0539 Gross per 24 hour  Intake 1136.5 ml  Output --  Net 1136.5 ml   Filed Weights   07/01/20 1045 07/02/20 0241  Weight: 42.5 kg 42 kg    Examination:  General exam: Very deconditioned, debilitated .weak HEENT: Firm mass on the left side of the neck Respiratory system: Bilateral equal air entry, normal vesicular breath sounds, no wheezes or crackles  Cardiovascular system: S1 & S2 heard, RRR. No JVD, murmurs, rubs, gallops or clicks.  Ecchymosis on the chest Gastrointestinal system: Abdomen is nondistended, soft and nontender. No organomegaly or masses felt. Normal bowel sounds heard. Central nervous system: Alert and oriented. No focal neurological deficits. Extremities: No edema, no clubbing ,no cyanosis Skin: No rashes, lesions or ulcers,no icterus ,no pallor  Data Reviewed: I have personally reviewed following labs and imaging studies  CBC: Recent Labs  Lab 07/01/20 1115 07/02/20 0413 07/03/20 0154 07/04/20 0044  WBC 14.0* 16.1* 14.6* 16.3*  NEUTROABS 12.8*  --   --  13.8*  HGB 11.3* 9.2* 9.7* 9.5*  HCT 37.2  29.7* 31.6* 31.3*  MCV 91.0 89.2 89.3 89.9  PLT 233 194 219 027   Basic Metabolic Panel: Recent Labs  Lab 07/01/20 1115 07/02/20 0413 07/02/20 0838 07/03/20 0154  NA 139 138  --  137  K 3.9 3.0*  --  4.1  CL 99 95*  --  97*  CO2 27 29  --  30  GLUCOSE 168* 123*  --  132*  BUN 22 15  --  15  CREATININE 0.59 0.57  --  0.65  CALCIUM 8.1* 7.2*  --  7.2*  MG  --   --  1.9  --    GFR: Estimated Creatinine Clearance: 24.7 mL/min (by C-G formula based on SCr of 0.65 mg/dL). Liver Function Tests: Recent Labs  Lab 07/01/20 1115  AST 38  ALT 31  ALKPHOS 68  BILITOT 2.4*  PROT 6.2*  ALBUMIN 3.5   No results for input(s): LIPASE, AMYLASE in the last 168 hours. No results for input(s): AMMONIA in the last 168 hours. Coagulation Profile: No results for input(s): INR, PROTIME in the last 168 hours. Cardiac Enzymes: No results for input(s): CKTOTAL, CKMB, CKMBINDEX, TROPONINI in the last 168 hours. BNP (last 3 results) No  results for input(s): PROBNP in the last 8760 hours. HbA1C: No results for input(s): HGBA1C in the last 72 hours. CBG: No results for input(s): GLUCAP in the last 168 hours. Lipid Profile: No results for input(s): CHOL, HDL, LDLCALC, TRIG, CHOLHDL, LDLDIRECT in the last 72 hours. Thyroid Function Tests: Recent Labs    07/01/20 1722 07/02/20 0838  TSH 0.026*  --   FREET4  --  3.08*  T3FREE  --  2.2   Anemia Panel: No results for input(s): VITAMINB12, FOLATE, FERRITIN, TIBC, IRON, RETICCTPCT in the last 72 hours. Sepsis Labs: Recent Labs  Lab 07/02/20 0838  PROCALCITON <0.10    Recent Results (from the past 240 hour(s))  Urine culture     Status: Abnormal   Collection Time: 07/01/20  1:37 PM   Specimen: Urine, Random  Result Value Ref Range Status   Specimen Description URINE, RANDOM  Final   Special Requests   Final    NONE Performed at [facility], [address] [place], [place] [address]    Culture MULTIPLE SPECIES PRESENT,  SUGGEST RECOLLECTION (A)  Final   Report Status 07/02/2020 FINAL  Final  Respiratory Panel by RT PCR (Flu A&B, Covid) - Nasopharyngeal Swab     Status: None   Collection Time: 07/01/20  2:43 PM   Specimen: Nasopharyngeal Swab  Result Value Ref Range Status   SARS Coronavirus 2 by RT PCR NEGATIVE NEGATIVE Final    Comment: (NOTE) SARS-CoV-2 target nucleic acids are NOT DETECTED.  The SARS-CoV-2 RNA is generally detectable in upper respiratoy specimens during the acute phase of infection. The lowest concentration of SARS-CoV-2 viral copies this assay can detect is 131 copies/mL. A negative result does not preclude SARS-Cov-2 infection and should not be used as the sole basis for treatment or other patient management decisions. A negative result may occur with  improper specimen collection/handling, submission of specimen other than nasopharyngeal swab, presence of viral mutation(s) within the areas targeted by this assay, and inadequate number of viral copies (<131 copies/mL). A negative result must be combined with clinical observations, patient history, and epidemiological information. The expected result is Negative.  Fact Sheet for Patients:  PinkCheek.be  Fact Sheet for Healthcare Providers:  GravelBags.it  This test is no t yet approved or cleared by the [place] FDA and  has been authorized for detection and/or diagnosis of SARS-CoV-2 by FDA under an Emergency Use Authorization (EUA). This EUA will remain  in effect (meaning this test can be used) for the duration of the COVID-19 declaration under Section 564(b)(1) of the Act, 21 U.S.C. section 360bbb-3(b)(1), unless the authorization is terminated or revoked sooner.     Influenza A by PCR NEGATIVE NEGATIVE Final   Influenza B by PCR NEGATIVE NEGATIVE Final    Comment: (NOTE) The Xpert Xpress SARS-CoV-2/FLU/RSV assay is intended as an aid in  the diagnosis of  influenza from Nasopharyngeal swab specimens and  should not be used as a sole basis for treatment. Nasal washings and  aspirates are unacceptable for Xpert Xpress SARS-CoV-2/FLU/RSV  testing.  Fact Sheet for Patients: PinkCheek.be  Fact Sheet for Healthcare Providers: GravelBags.it  This test is not yet approved or cleared by the [place] FDA and  has been authorized for detection and/or diagnosis of SARS-CoV-2 by  FDA under an Emergency Use Authorization (EUA). This EUA will remain  in effect (meaning this test can be used) for the duration of the  Covid-19 declaration under Section 564(b)(1) of the  Act, 21  U.S.C. section 360bbb-3(b)(1), unless the authorization is  terminated or revoked. Performed at [facility], [place] [address] [place], [place] [address]          Radiology Studies: DG Swallowing [facility] Pathology  Result Date: 07/03/2020 Objective Swallowing Evaluation: Type of Study: MBS-Modified Barium Swallow Study  Patient Details Name: Dawn Hampton MRN: [id number] Date of Birth: 1922-06-19 Today's Date: 07/03/2020 Time: SLP Start Time (ACUTE ONLY): 1914 -SLP Stop Time (ACUTE ONLY): 1313 SLP Time Calculation (min) (ACUTE ONLY): 15 min Past Medical History: Past Medical History: Diagnosis Date . Abdominal mass, right lower quadrant  . Anemia  . Hypertension  . Macular degeneration  . Osteoporosis  . Shingles   T8 Past Surgical History: Past Surgical History: Procedure Laterality Date . cataracts  2004 . PUD, partial gastrectomy 1992   . TOTAL ABDOMINAL HYSTERECTOMY W/ BILATERAL SALPINGOOPHORECTOMY  1975 HPI: Dawn Hampton is a 86 y.o. female with macular degeneration in both eyes with active choroidal neovascularization, cachexia, A. fib, chronic bilateral lower extremity pitting edema, hypertension, moderate mitral regurgitation and mild aortic regurgitation that present with weakness from  appointment at A.fib clinic. Pt has a bulging hematoma/mass on L side of throat that was eval by ENT last weak but has since gotten worse. CXR Small right pleural effusion. Overlying right basilar opacity most likely represents atelectasis. Aspiration or pneumonia is not excluded.Referred by PT who witnessed pt coughing yesterday with water and pt voiced coughing with water last night to PT.  No data recorded Assessment / Plan / Recommendation CHL IP CLINICAL IMPRESSIONS 07/03/2020 Clinical Impression Pt exhibits significant pharyngeal dysphagia exacerbated by left neck hematoma, kyphosis and weakened state with malnourishment for several weeks in 86 yr old patient. Her hyoid movement forward and anteriorally is significantly decreased with mostly patent laryngeal vestibule from minimal epiglottic inversion. Honey thick barium was aspirated during swallows followed by timely reflexive coughs unable to clear aspirate. Chin tuck then left head turn trialed seperately both non beneficial for laryngeal closure. Residue in valleculae was only minimal. Bolus moved through UES with intermittent residue above UES. As neck edema decreases, endurance increases goal is for return to unrestricted po's. Recommending puree (D1), pudding thick liquids and crushed meds. Asked pt to perform volitional cough after every 3 bites.      SLP Visit Diagnosis Dysphagia, pharyngoesophageal phase (R13.14) Attention and concentration deficit following -- Frontal lobe and executive function deficit following -- Impact on safety and function Moderate aspiration risk;Severe aspiration risk   CHL IP TREATMENT RECOMMENDATION 07/03/2020 Treatment Recommendations Therapy as outlined in treatment plan below   Prognosis 07/03/2020 Prognosis for Safe Diet Advancement Fair Barriers to Reach Goals Other (Comment) Barriers/Prognosis Comment -- CHL IP DIET RECOMMENDATION 07/03/2020 SLP Diet Recommendations Pudding thick liquid;Dysphagia 1 (Puree) solids Liquid  Administration via -- Medication Administration Crushed with puree Compensations Clear throat intermittently;Other (Comment) Postural Changes Seated upright at 90 degrees   CHL IP OTHER RECOMMENDATIONS 07/03/2020 Recommended Consults -- Oral Care Recommendations Oral care BID Other Recommendations Order thickener from pharmacy   CHL IP FOLLOW UP RECOMMENDATIONS 07/03/2020 Follow up Recommendations Skilled Nursing facility   [facility] IP FREQUENCY AND DURATION 07/03/2020 Speech Therapy Frequency (ACUTE ONLY) min 2x/week Treatment Duration 2 weeks      CHL IP ORAL PHASE 07/03/2020 Oral Phase WFL Oral - Pudding Teaspoon -- Oral - Pudding Cup -- Oral - Honey Teaspoon -- Oral - Honey Cup -- Oral - Nectar Teaspoon -- Oral - Nectar Cup --  Oral - Nectar Straw -- Oral - Thin Teaspoon -- Oral - Thin Cup -- Oral - Thin Straw -- Oral - Puree -- Oral - Mech Soft -- Oral - Regular -- Oral - Multi-Consistency -- Oral - Pill -- Oral Phase - Comment --  CHL IP PHARYNGEAL PHASE 07/03/2020 Pharyngeal Phase Impaired Pharyngeal- Pudding Teaspoon -- Pharyngeal -- Pharyngeal- Pudding Cup -- Pharyngeal -- Pharyngeal- Honey Teaspoon -- Pharyngeal -- Pharyngeal- Honey Cup Penetration/Aspiration during swallow;Reduced airway/laryngeal closure;Reduced laryngeal elevation;Reduced epiglottic inversion;Reduced anterior laryngeal mobility;Pharyngeal residue - valleculae Pharyngeal Material enters airway, passes BELOW cords and not ejected out despite cough attempt by patient Pharyngeal- Nectar Teaspoon -- Pharyngeal -- Pharyngeal- Nectar Cup -- Pharyngeal -- Pharyngeal- Nectar Straw -- Pharyngeal -- Pharyngeal- Thin Teaspoon -- Pharyngeal -- Pharyngeal- Thin Cup -- Pharyngeal -- Pharyngeal- Thin Straw -- Pharyngeal -- Pharyngeal- Puree Reduced tongue base retraction;Reduced laryngeal elevation;Reduced anterior laryngeal mobility;Reduced epiglottic inversion;Reduced airway/laryngeal closure;Penetration/Aspiration during swallow Pharyngeal Material enters  airway, remains ABOVE vocal cords and not ejected out Pharyngeal- Mechanical Soft -- Pharyngeal -- Pharyngeal- Regular -- Pharyngeal -- Pharyngeal- Multi-consistency -- Pharyngeal -- Pharyngeal- Pill -- Pharyngeal -- Pharyngeal Comment --  CHL IP CERVICAL ESOPHAGEAL PHASE 07/03/2020 Cervical Esophageal Phase (No Data) Pudding Teaspoon -- Pudding Cup -- Honey Teaspoon -- Honey Cup -- Nectar Teaspoon -- Nectar Cup -- Nectar Straw -- Thin Teaspoon -- Thin Cup -- Thin Straw -- Puree -- Mechanical Soft -- Regular -- Multi-consistency -- Pill -- Cervical Esophageal Comment -- [place] Siren 07/03/2020, 2:45 PM [name] Litaker M.Ed Actor Pager [phone] Office [phone]                   Scheduled Meds: . amiodarone  200 mg Oral Daily  . losartan  100 mg Oral Daily  . methimazole  5 mg Oral BID   Continuous Infusions: . lactated ringers 75 mL/hr at 07/03/20 1420     LOS: 2 days    Time spent: 25 mins.More than 50% of that time was spent in counseling and/or coordination of care.      Shelly Coss, MD [facility] P11/11/2019, 8:15 AM

## 2020-07-04 NOTE — Progress Notes (Signed)
Tele called and said at 1503 pt. Had a run of QRS SVT 13 beats long, then resumed back to NSR. MD notified.

## 2020-07-04 NOTE — Plan of Care (Signed)

## 2020-07-04 NOTE — Progress Notes (Signed)
Son refused for pt to have ultrasound of thyroid, he said that the bruseing on her neck came from the last ultrasound.

## 2020-07-04 NOTE — [place] FL2 (Signed)
[place] MEDICAID FL2 LEVEL OF CARE SCREENING TOOL     IDENTIFICATION  Patient Name: Dawn Hampton Birthdate: [birth year] Sex: female Admission Date (Current Location): 07/01/2020  [facility] and [place] Number:  Herbalist and Address:  [facility] [place], [place] [address], [place], [place] [address]      Provider Number: [id number]  Attending Physician Name and Address:  Shelly Coss, MD  Relative Name and Phone Number:  Heber Billings   [phone]    Current Level of Care: Hospital Recommended Level of Care: [place] Prior Approval Number:    Date Approved/Denied:   PASRR Number: [id number]  Discharge Plan: SNF    Current Diagnoses: Patient Active Problem List   Diagnosis Date Noted  . Generalized weakness 07/02/2020  . Pneumonia 07/01/2020  . Paroxysmal atrial fibrillation ([place]) 02/29/2020  . Secondary hypercoagulable state ([place]) 02/29/2020  . 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 Advanced nonexudative age-related macular degeneration of right eye with subfoveal involvement 12/11/2019  . Exudative age-related macular degeneration of left eye with active choroidal neovascularization ([place]) 12/11/2019  . Cystoid macular edema of left eye 12/11/2019  . Posterior vitreous detachment of left eye 12/11/2019    Orientation RESPIRATION BLADDER Height & Weight     Self, Time, Situation, Place  Normal Incontinent Weight: 92 lb 9.5 oz (42 kg) Height:  4\' 7"  (139.7 cm)  BEHAVIORAL SYMPTOMS/MOOD NEUROLOGICAL BOWEL NUTRITION STATUS      Continent Diet (DYS diet, see discharge summary)  AMBULATORY STATUS COMMUNICATION OF NEEDS Skin   Limited Assist Verbally Other (Comment) (ecchymosis)                       Personal Care Assistance  Level of Assistance  Bathing, Feeding, Dressing Bathing Assistance: Limited assistance Feeding assistance: Limited assistance Dressing Assistance: Limited assistance     Functional Limitations Info  Sight, Hearing, Speech Sight Info: Impaired Hearing Info: Adequate Speech Info: Adequate    SPECIAL CARE FACTORS FREQUENCY  PT (By licensed PT), Speech therapy     PT Frequency: 5x week       Speech Therapy Frequency: TBD      Contractures Contractures Info: Not present    Additional Factors Info  Code Status, Allergies Code Status Info: DNR Allergies Info: Bacitracin-polymyxin B, Sulfa Antibiotics, Penicillins           Current Medications (07/04/2020):  This is the current hospital active medication list Current Facility-Administered Medications  Medication Dose Route Frequency Provider Last Rate Last Admin  . acetaminophen (TYLENOL) tablet 650 mg  650 mg Oral Q4H PRN Vernelle Emerald, MD   650 mg at 07/04/20 7062  . amiodarone (PACERONE) tablet 200 mg  200 mg Oral Daily Cox, Amy N, DO   200 mg at 07/04/20 0859  . hydrALAZINE (APRESOLINE) injection 10 mg  10 mg Intravenous Q6H PRN Cox, Amy N, DO      . lactated ringers infusion   Intravenous Continuous Shelly Coss, MD 75 mL/hr at 07/03/20 1420 New Bag at 07/03/20 1420  . losartan (COZAAR) tablet 100 mg  100 mg Oral Daily Cox, Amy N, DO   100 mg at 07/04/20 0900  . methimazole (TAPAZOLE) tablet 5 mg  5 mg Oral BID Shelly Coss, MD   5 mg at 07/04/20 0859  . Resource ThickenUp Clear  Oral PRN Shelly Coss, MD         Discharge Medications: Please see discharge summary for a list of discharge medications.  Relevant Imaging Results:  Relevant Lab Results:   Additional Information SSN [id number]  Joanne Chars, LCSW

## 2020-07-04 NOTE — Progress Notes (Signed)
PT Cancellation Note  Patient Details Name: Dawn Hampton MRN: [id number] DOB: 11-27-1921   Cancelled Treatment:    Reason Eval/Treat Not Completed: Patient declined, no reason specified. Patient is very lethargic at this time. Requests I try to come back later.    Chauntay Paszkiewicz 07/04/2020, 11:38 AM

## 2020-07-04 NOTE — Progress Notes (Signed)
Physical Therapy Treatment Patient Details Name: Dawn Hampton MRN: [id number] DOB: [birth year] Today's Date: 07/04/2020    History of Present Illness Dawn Hampton is a 86 y.o. female with macular degeneration in botht eyes with active choroidal neovascularization, cachexia, A. fib, chronic bilateral lower extremity pitting edema,hypertension, moderate mitral regurgitation and mild aortic regurgitation that present with weakness from appointment at A.fib clinic. Pt has a bulging hematoma/mass on L side of throat th was eval by ENT last weak but has since gotten worse. Pt said there is no intervention at this time.    PT Comments    Patient received in recliner. Appears very weak, lethargic, uncomfortable. Reluctant, but agrees to LE exercises. She declines ambulation or getting back into bed at this time. She requires assistance for LE exercises due to fatigue. Asked that I help her eat some of her pureed pineapple. Re-positioned patient in recliner to improve comfort. Patient will continue to benefit from skilled PT while here to improve strength and functional mobility. At this time she would be too weak to return home and will benefit from SNF at discharge.       Follow Up Recommendations  SNF     Equipment Recommendations  Rolling walker with 5" wheels    Recommendations for Other Services       Precautions / Restrictions Precautions Precautions: Fall Precaution Comments: pt very kyphotic, large L side of throat hematoma Restrictions Weight Bearing Restrictions: No    Mobility  Bed Mobility               General bed mobility comments: patient received in recliner. Wants to stay in recliner at this time  Transfers                 General transfer comment: deferred  Ambulation/Gait             General Gait Details: deferred today due to patient declining and weakness   Stairs             Wheelchair Mobility    Modified Rankin  (Stroke Patients Only)       Balance                                            Cognition Arousal/Alertness: Awake/alert;Lethargic Behavior During Therapy: WFL for tasks assessed/performed Overall Cognitive Status: Within Functional Limits for tasks assessed                                        Exercises Other Exercises Other Exercises: B LE exercises: AP, heel slides, hip abd/add (with min guard assist to reduce friction), SLR. x10 reps    General Comments        Pertinent Vitals/Pain Pain Assessment: No/denies pain Faces Pain Scale: Hurts little more Pain Location: neck Pain Descriptors / Indicators: Sore;Discomfort Pain Intervention(s): Monitored during session    Home Living                      Prior Function            PT Goals (current goals can now be found in the care plan section) Acute Rehab PT Goals Patient Stated Goal: feel better PT Goal Formulation: With patient Time For Goal Achievement: 07/16/20 Potential  to Achieve Goals: Fair Progress towards PT goals: Not progressing toward goals - comment (patient limited by fatigue and weaknes this day)    Frequency    Min 2X/week      PT Plan Discharge plan needs to be updated;Frequency needs to be updated    Co-evaluation              AM-PAC PT "6 Clicks" Mobility   Outcome Measure  Help needed turning from your back to your side while in a flat bed without using bedrails?: A Lot Help needed moving from lying on your back to sitting on the side of a flat bed without using bedrails?: A Lot Help needed moving to and from a bed to a chair (including a wheelchair)?: A Lot Help needed standing up from a chair using your arms (e.g., wheelchair or bedside chair)?: A Lot Help needed to walk in hospital room?: A Lot Help needed climbing 3-5 steps with a railing? : Total 6 Click Score: 11    End of Session   Activity Tolerance: Patient limited by  fatigue Patient left: in chair;with call bell/phone within reach Nurse Communication: Mobility status PT Visit Diagnosis: Muscle weakness (generalized) (M62.81);Other abnormalities of gait and mobility (R26.89);Difficulty in walking, not elsewhere classified (R26.2);Adult, failure to thrive (R62.7)     Time: 7322-0254 PT Time Calculation (min) (ACUTE ONLY): 15 min  Charges:  $Therapeutic Exercise: 8-22 mins                     Nillie Bartolotta, PT, GCS 07/04/20,2:32 PM

## 2020-07-05 DIAGNOSIS — M6259 Muscle wasting and atrophy, not elsewhere classified, multiple sites: Secondary | ICD-10-CM | POA: Diagnosis not present

## 2020-07-05 DIAGNOSIS — S1093XD Contusion of unspecified part of neck, subsequent encounter: Secondary | ICD-10-CM | POA: Diagnosis not present

## 2020-07-05 DIAGNOSIS — R1310 Dysphagia, unspecified: Secondary | ICD-10-CM | POA: Diagnosis not present

## 2020-07-05 DIAGNOSIS — E0590 Thyrotoxicosis, unspecified without thyrotoxic crisis or storm: Secondary | ICD-10-CM | POA: Diagnosis not present

## 2020-07-05 DIAGNOSIS — S1093XA Contusion of unspecified part of neck, initial encounter: Secondary | ICD-10-CM | POA: Diagnosis not present

## 2020-07-05 DIAGNOSIS — I480 Paroxysmal atrial fibrillation: Secondary | ICD-10-CM | POA: Diagnosis not present

## 2020-07-05 DIAGNOSIS — I4891 Unspecified atrial fibrillation: Secondary | ICD-10-CM | POA: Diagnosis not present

## 2020-07-05 DIAGNOSIS — R531 Weakness: Secondary | ICD-10-CM | POA: Diagnosis not present

## 2020-07-05 LAB — CBC WITH DIFFERENTIAL/PLATELET
Abs Immature Granulocytes: 0.11 10*3/uL — ABNORMAL HIGH (ref 0.00–0.07)
Basophils Absolute: 0 10*3/uL (ref 0.0–0.1)
Basophils Relative: 0 %
Eosinophils Absolute: 0 10*3/uL (ref 0.0–0.5)
Eosinophils Relative: 0 %
HCT: 32.8 % — ABNORMAL LOW (ref 36.0–46.0)
Hemoglobin: 9.9 g/dL — ABNORMAL LOW (ref 12.0–15.0)
Immature Granulocytes: 1 %
Lymphocytes Relative: 3 %
Lymphs Abs: 0.5 10*3/uL — ABNORMAL LOW (ref 0.7–4.0)
MCH: 27.3 pg (ref 26.0–34.0)
MCHC: 30.2 g/dL (ref 30.0–36.0)
MCV: 90.6 fL (ref 80.0–100.0)
Monocytes Absolute: 1 10*3/uL (ref 0.1–1.0)
Monocytes Relative: 5 %
Neutro Abs: 17.2 10*3/uL — ABNORMAL HIGH (ref 1.7–7.7)
Neutrophils Relative %: 91 %
Platelets: 188 10*3/uL (ref 150–400)
RBC: 3.62 MIL/uL — ABNORMAL LOW (ref 3.87–5.11)
RDW: 15.4 % (ref 11.5–15.5)
WBC: 18.9 10*3/uL — ABNORMAL HIGH (ref 4.0–10.5)
nRBC: 0 % (ref 0.0–0.2)

## 2020-07-05 MED ORDER — METHIMAZOLE 5 MG PO TABS: 5.0000 mg | ORAL_TABLET | Freq: Two times a day (BID) | ORAL | Status: AC

## 2020-07-05 MED ORDER — OXYCODONE-ACETAMINOPHEN 5-325 MG PO TABS
1.0000 | ORAL_TABLET | ORAL | 0 refills | Status: AC | PRN
Start: 2020-07-05 — End: 2021-07-05

## 2020-07-05 NOTE — TOC Transition Note (Signed)
Transition of Care [facility]) - CM/SW Discharge Note   Patient Details  Name: Dawn Hampton MRN: [id number] Date of Birth: 01-04-1922  [facility] CM/SW Contact:  Benard Halsted, LCSW Phone Number: 07/05/2020, 11:36 AM   Clinical Narrative:    Patient will DC to: [facility] Anticipated DC date: 07/05/20 Family notified: Son at bedside Transport by: Corey Harold   Per MD patient ready for DC to [place]. RN to call report prior to discharge [phone] Room 204). RN, patient, patient's family, and facility notified of DC. Discharge Summary and FL2 sent to facility. DC packet on chart. Ambulance transport requested for patient.   CSW will sign off for now as social work intervention is no longer needed. Please consult [place] again if new needs arise.      Final next level of care: Skilled Nursing Facility Barriers to Discharge: Barriers Resolved   Patient Goals and CMS Choice Patient states their goals for this hospitalization and ongoing recovery are:: "get back home" CMS Medicare.gov Compare Post Acute Care list provided to:: Patient Choice offered to / list presented to : Patient  Discharge Placement   Existing PASRR number confirmed : 07/05/20          Patient chooses bed at: [place] Patient to be transferred to facility by: [place] Name of family member notified: Son at bedside Patient and family notified of of transfer: 07/05/20  Discharge Plan and Services     Post Acute Care Choice: [place]                               Social Determinants of Health (SDOH) Interventions     Readmission Risk Interventions No flowsheet data found.

## 2020-07-05 NOTE — Progress Notes (Signed)
Pt remains weak, lethargic and is uncomfortable in bed. Spend most of the night in chair this shift. Continues to cough when swallowing.

## 2020-07-05 NOTE — Discharge Summary (Signed)
Physician Discharge Summary  [facility] Dawn Hampton DOB: March 17, 1922 DOA: 07/01/2020  PCP: Leanna Battles, MD  Admit date: 07/01/2020 Discharge date: 07/05/2020  Admitted From: Home Disposition:  Home  Discharge Condition:Stable CODE STATUS:DNR Diet recommendation: Dysphagia 1  Brief/Interim Summary:  Patient is a 86 year old female who lives alone and ambulates with the help of cane, past medical history of paroxysmal A. fib, hypertension, valvular heart disease: Moderate MR, macular degeneration, chronic bilateral lower extremity pitting edema, recent neck hematoma who was sent to the emergency department from ENT clinic for the evaluation of poor oral intake, weakness, unsteady gait, poor oral intake. Anticoagulation was stopped due to finding of neck hematoma.  Hospital course remarkable for dysphagia.  She was also started on methimazole for finding of hypothyroidism. PT/OT recommended  skilled nursing facility on discharge.  Given her age, multiple comorbidities, she has poor prognosis but she is medically stable for discharge to skilled nursing facility today.  Following problems were addressed during her hospitalization:  Profound weakness/dehydration/debility/deconditioning: Most likely multifactorial.  She had poor mobility, poor oral intake, weakness.  Found to be dehydrated on presentation.  She was also given some antibiotics for possible pneumonia but now antibiotics discontinued.  PT/OT recommended skilled nursing facility temporarily.    Left-sided neck hematoma/thyroid nodules/hypothyroidism: CT soft tissue showed hematoma in the left neck but underlying neoplasm not excluded so follow-up imaging is recommended after hematoma resolves. She was on Eliquis at home for anticoagulation which has been stopped and most likely this will be stopped permanently but we recommend to follow-up with cardiology as an outpatient. Neck hematoma size is improving.  She complains of   pain on the left neck at the hematoma site.  Hyperthyroidism: Ultrasound of the thyroid gland done recently showed 1.4 cm thyroid nodule in the isthmus.  Elevated T4, low TSH consistent with hyperthyroidism.  Discussed with endocrinology Dr. Buddy Duty 07/03/2020.  Started on methimazole 5 mg twice a day.  She will follow-up with Dr. Buddy Duty as an outpatient.Do a repeat thyroid function test in 4 weeks  Lower extremity edema: Chronic.  Improved after a dose of Lasix that was given in the emergency department.  Paroxysmal A. fib: Currently rate is controlled.  Continue amiodarone.  Anticoagulation discontinued due to finding of neck hematoma.TTE done 04/01/2020: LVEF 55-60% with grade 1 diastolic dysfunction.  Follows with A. fib clinic. Not requiring rate control medication  Hypertension: Currently blood pressure stable.  Continue losartan.  Normocytic anemia: Most likely secondary to chronic medical conditions.  Hemoglobin at baseline.  Dysphagia: Speech following.  On dysphagia 1 diet.  She needs to be careful . She has been recommended to take small bites and be upright while eating and for 30 to 60 minutes after having food  Leukocytosis: Most likely reactive.  Continue to monitor.  Low suspicion for infectious etiology.Check CBC in a week  Elevated troponin: Nonspecific finding, flat trend.  Does not complain of any chest pain    Discharge Diagnoses:  Active Problems:   Pneumonia   Generalized weakness    Discharge Instructions  Discharge Instructions    Diet general   Complete by: As directed    Dysphagia 1   Discharge instructions   Complete by: As directed    1)Take prescribed medications as instructed 2)Follow up with Endocrinology as an outpatient in 3 to 4 weeks.  Name and number the provider has been attached. 3)Follow up with your ENT physician as an outpatient 4)Do a CBC and BMP tests in a  week   Increase activity slowly   Complete by: As directed      Allergies  as of 07/05/2020      Reactions   Bacitracin-polymyxin B Anaphylaxis   Sulfa Antibiotics Itching   Penicillins Swelling, Rash   Swelling located at injection site      Medication List    STOP taking these medications   Eliquis 2.5 MG Tabs tablet Generic drug: apixaban   traMADol 50 MG tablet Commonly known as: ULTRAM     TAKE these medications   acetaminophen 650 MG CR tablet Commonly known as: TYLENOL Take 1,300 mg by mouth daily as needed for pain.   amiodarone 200 MG tablet Commonly known as: PACERONE TAKE 1 TABLET BY MOUTH EVERY DAY   beta carotene w/minerals tablet Take 1 tablet by mouth daily.   CALCIUM-D PO Take 1 tablet by mouth 2 (two) times daily.   iron polysaccharides 150 MG capsule Commonly known as: NIFEREX Take 150 mg by mouth daily.   latanoprost 0.005 % ophthalmic solution Commonly known as: XALATAN Place 1 drop into both eyes at bedtime.   loratadine 10 MG tablet Commonly known as: CLARITIN Take 10 mg by mouth daily as needed for allergies.   losartan 100 MG tablet Commonly known as: COZAAR Take 100 mg by mouth daily.   methimazole 5 MG tablet Commonly known as: TAPAZOLE Take 1 tablet (5 mg total) by mouth 2 (two) times daily.   multivitamin with minerals Tabs tablet Take 1 tablet by mouth daily.   oxyCODONE-acetaminophen 5-325 MG tablet Commonly known as: Percocet Take 1 tablet by mouth every 4 (four) hours as needed for severe pain.   SYSTANE OP Place 1 drop into both eyes 2 (two) times daily.   Vitamin D (Ergocalciferol) 1.25 MG (50000 UNIT) Caps capsule Commonly known as: DRISDOL Take 50,000 Units by mouth every Monday.       Follow-up Information    Delrae Rend, MD. Schedule an appointment as soon as possible for a visit in 4 week(s).   Specialty: Endocrinology Contact information: [address] [place] [address] [phone]              Allergies  Allergen Reactions  .  Bacitracin-Polymyxin B Anaphylaxis  . Sulfa Antibiotics Itching  . Penicillins Swelling and Rash    Swelling located at injection site    Consultations:  None   Procedures/Studies: CT Head Wo Contrast  Result Date: 07/01/2020 CLINICAL DATA:  Weakness EXAM: CT HEAD WITHOUT CONTRAST TECHNIQUE: Contiguous axial images were obtained from the base of the skull through the vertex without intravenous contrast. COMPARISON:  CT head dated 12/24/2008 FINDINGS: Brain: No evidence of acute infarction, hemorrhage, hydrocephalus, extra-axial collection or mass lesion/mass effect. Subcortical white matter and periventricular small vessel ischemic changes. Vascular: Intracranial atherosclerosis. Skull: Normal. Negative for fracture or focal lesion. Sinuses/Orbits: The visualized paranasal sinuses are essentially clear. The mastoid air cells are unopacified. Other: None. IMPRESSION: No evidence of acute intracranial abnormality. Small vessel ischemic changes. Electronically Signed   By: Julian Hy M.D.   On: 07/01/2020 14:12   CT SOFT TISSUE NECK W CONTRAST  Result Date: 06/24/2020 CLINICAL DATA:  Left neck mass. Mass came up suddenly after carotid ultrasound. Patient on Eliquis. Diffuse bruising in the left neck. EXAM: CT NECK WITH CONTRAST TECHNIQUE: Multidetector CT imaging of the neck was performed using the standard protocol following the bolus administration of intravenous contrast. CONTRAST:  47mL OMNIPAQUE IOHEXOL 300 MG/ML  SOLN  COMPARISON:  Thyroid ultrasound 06/24/2020 FINDINGS: Pharynx and larynx: Larynx is mildly displaced to the right due to the large left neck mass. Airway is intact. No pharyngeal mass. There is external effacement of the left piriform sinus due to mass. Salivary glands: No inflammation, mass, or stone. Thyroid: Enhancing thyroid tissue is heterogeneous. Thyroid is not enlarged. Calcified mass posterior to the left thyroid measures 14 x 19 mm. 12 mm calcified mass in the left  isthmus. Adjacent to this there is a soft tissue nodule in the isthmus measuring 29 x 22 mm Lymph nodes: 8 mm supraclavicular lymph node on the left. Large left neck mass. At first glance this appears to be a lymph node mass however given history, this may represent hematoma. The mass measures approximately 5.5 x 6 cm and is anterior and above the left thyroid. The mass appears to be engulfing some of the left thyroid tissue. Mass has heterogeneous density without definite enhancement. Vascular: Left jugular vein is occluded likely due to compression by the mass. Right jugular vein shows normal enhancement. Both carotid arteries are patent. Limited intracranial: Negative Visualized orbits: No orbital mass.  Bilateral cataract extraction Mastoids and visualized paranasal sinuses: Mild mucosal edema paranasal sinuses. Mastoid clear bilaterally. Skeleton: Cervical spondylosis without acute skeletal abnormality. Upper chest: Numerous infiltrates in the upper lobes bilaterally. These are patchy ill-defined and peripheral and are suspicious for COVID pneumonia. Other: None IMPRESSION: 1. Large mass left neck measuring 5.5 x 6 cm. By history, this came up suddenly and is associated with ecchymosis. Patient is on anticoagulation. Therefore this is most likely hematoma. Underlying neoplasm which bled is possible therefore follow-up imaging is recommended after the hematoma has resolved. 2. Multiple thyroid nodules as above. The nodule in the isthmus may need to be biopsied after the hematoma resolves. 3. Bilateral lung infiltrates suspicious for COVID pneumonia. 4. Left jugular vein is not patent and may be occluded from extrinsic compression by the mass. 5. These results were called by telephone at the time of interpretation on 06/24/2020 at 7:14 pm to provider [facility] , who verbally acknowledged these results. Electronically Signed   By: Franchot Gallo M.D.   On: 06/24/2020 19:15   DG Chest Portable 1  View  Result Date: 07/01/2020 CLINICAL DATA:  Weakness EXAM: PORTABLE CHEST 1 VIEW COMPARISON:  05/11/2020. FINDINGS: The patient is rotated, which somewhat limits evaluation. Similar cardiomediastinal silhouette. Aortic atherosclerosis. Similar small right pleural effusion. Overlying right basilar opacity. Similar elevated right hemidiaphragm. Exaggerated thoracic kyphosis. Clips in the region of the gastroesophageal junction. IMPRESSION: Small right pleural effusion. Overlying right basilar opacity most likely represents atelectasis. Aspiration or pneumonia is not excluded. Electronically Signed   By: Margaretha Sheffield MD   On: 07/01/2020 11:28   DG Swallowing Func-Speech Pathology  Result Date: 07/03/2020 Objective Swallowing Evaluation: Type of Study: MBS-Modified Barium Swallow Study  Patient Details Name: Meredeth Furber MRN: [id number] Date of Birth: [birth year] Today's Date: 07/03/2020 Time: SLP Start Time (ACUTE ONLY): 4174 -SLP Stop Time (ACUTE ONLY): 1313 SLP Time Calculation (min) (ACUTE ONLY): 15 min Past Medical History: Past Medical History: Diagnosis Date . Abdominal mass, right lower quadrant  . Anemia  . Hypertension  . Macular degeneration  . Osteoporosis  . Shingles   T8 Past Surgical History: Past Surgical History: Procedure Laterality Date . cataracts  2004 . PUD, partial gastrectomy 1992   . TOTAL ABDOMINAL HYSTERECTOMY W/ BILATERAL SALPINGOOPHORECTOMY  1975 HPI: Maia Ward Dawn Heck is a 86 y.o. female with  macular degeneration in both eyes with active choroidal neovascularization, cachexia, A. fib, chronic bilateral lower extremity pitting edema, hypertension, moderate mitral regurgitation and mild aortic regurgitation that present with weakness from appointment at A.fib clinic. Pt has a bulging hematoma/mass on L side of throat that was eval by ENT last weak but has since gotten worse. CXR Small right pleural effusion. Overlying right basilar opacity most likely represents atelectasis.  Aspiration or pneumonia is not excluded.Referred by PT who witnessed pt coughing yesterday with water and pt voiced coughing with water last night to PT.  No data recorded Assessment / Plan / Recommendation CHL IP CLINICAL IMPRESSIONS 07/03/2020 Clinical Impression Pt exhibits significant pharyngeal dysphagia exacerbated by left neck hematoma, kyphosis and weakened state with malnourishment for several weeks in 86 yr old patient. Her hyoid movement forward and anteriorally is significantly decreased with mostly patent laryngeal vestibule from minimal epiglottic inversion. Honey thick barium was aspirated during swallows followed by timely reflexive coughs unable to clear aspirate. Chin tuck then left head turn trialed seperately both non beneficial for laryngeal closure. Residue in valleculae was only minimal. Bolus moved through UES with intermittent residue above UES. As neck edema decreases, endurance increases goal is for return to unrestricted po's. Recommending puree (D1), pudding thick liquids and crushed meds. Asked pt to perform volitional cough after every 3 bites.      SLP Visit Diagnosis Dysphagia, pharyngoesophageal phase (R13.14) Attention and concentration deficit following -- Frontal lobe and executive function deficit following -- Impact on safety and function Moderate aspiration risk;Severe aspiration risk   CHL IP TREATMENT RECOMMENDATION 07/03/2020 Treatment Recommendations Therapy as outlined in treatment plan below   Prognosis 07/03/2020 Prognosis for Safe Diet Advancement Fair Barriers to Reach Goals Other (Comment) Barriers/Prognosis Comment -- CHL IP DIET RECOMMENDATION 07/03/2020 SLP Diet Recommendations Pudding thick liquid;Dysphagia 1 (Puree) solids Liquid Administration via -- Medication Administration Crushed with puree Compensations Clear throat intermittently;Other (Comment) Postural Changes Seated upright at 90 degrees   CHL IP OTHER RECOMMENDATIONS 07/03/2020 Recommended Consults -- Oral  Care Recommendations Oral care BID Other Recommendations Order thickener from pharmacy   CHL IP FOLLOW UP RECOMMENDATIONS 07/03/2020 Follow up Recommendations Skilled Nursing facility   [facility] IP FREQUENCY AND DURATION 07/03/2020 Speech Therapy Frequency (ACUTE ONLY) min 2x/week Treatment Duration 2 weeks      CHL IP ORAL PHASE 07/03/2020 Oral Phase WFL Oral - Pudding Teaspoon -- Oral - Pudding Cup -- Oral - Honey Teaspoon -- Oral - Honey Cup -- Oral - Nectar Teaspoon -- Oral - Nectar Cup -- Oral - Nectar Straw -- Oral - Thin Teaspoon -- Oral - Thin Cup -- Oral - Thin Straw -- Oral - Puree -- Oral - Mech Soft -- Oral - Regular -- Oral - Multi-Consistency -- Oral - Pill -- Oral Phase - Comment --  CHL IP PHARYNGEAL PHASE 07/03/2020 Pharyngeal Phase Impaired Pharyngeal- Pudding Teaspoon -- Pharyngeal -- Pharyngeal- Pudding Cup -- Pharyngeal -- Pharyngeal- Honey Teaspoon -- Pharyngeal -- Pharyngeal- Honey Cup Penetration/Aspiration during swallow;Reduced airway/laryngeal closure;Reduced laryngeal elevation;Reduced epiglottic inversion;Reduced anterior laryngeal mobility;Pharyngeal residue - valleculae Pharyngeal Material enters airway, passes BELOW cords and not ejected out despite cough attempt by patient Pharyngeal- Nectar Teaspoon -- Pharyngeal -- Pharyngeal- Nectar Cup -- Pharyngeal -- Pharyngeal- Nectar Straw -- Pharyngeal -- Pharyngeal- Thin Teaspoon -- Pharyngeal -- Pharyngeal- Thin Cup -- Pharyngeal -- Pharyngeal- Thin Straw -- Pharyngeal -- Pharyngeal- Puree Reduced tongue base retraction;Reduced laryngeal elevation;Reduced anterior laryngeal mobility;Reduced epiglottic inversion;Reduced airway/laryngeal closure;Penetration/Aspiration during swallow Pharyngeal Material enters airway,  remains ABOVE vocal cords and not ejected out Pharyngeal- Mechanical Soft -- Pharyngeal -- Pharyngeal- Regular -- Pharyngeal -- Pharyngeal- Multi-consistency -- Pharyngeal -- Pharyngeal- Pill -- Pharyngeal -- Pharyngeal Comment --  CHL  IP CERVICAL ESOPHAGEAL PHASE 07/03/2020 Cervical Esophageal Phase (No Data) Pudding Teaspoon -- Pudding Cup -- Honey Teaspoon -- Honey Cup -- Nectar Teaspoon -- Nectar Cup -- Nectar Straw -- Thin Teaspoon -- Thin Cup -- Thin Straw -- Puree -- Mechanical Soft -- Regular -- Multi-consistency -- Pill -- Cervical Esophageal Comment -- [place] Siren 07/03/2020, 2:45 PM [name] Litaker M.Ed Actor Pager [phone] Office [phone]              US THYROID  Result Date: 06/07/2020 CLINICAL DATA:  Lateral neck mass EXAM: THYROID ULTRASOUND TECHNIQUE: Ultrasound examination of the thyroid gland and adjacent soft tissues was performed. COMPARISON:  None. FINDINGS: Parenchymal Echotexture: Markedly heterogenous Isthmus: 0.7 cm Right lobe: 4.3 x 1.8 x 2 cm Left lobe: There is 7.9 x 4.2 x 3.6 cm _________________________________________________________ Estimated total number of nodules >/= 1 cm: 1 Number of spongiform nodules >/=  2 cm not described below (TR1): 0 Number of mixed cystic and solid nodules >/= 1.5 cm not described below ([place]): 0 _________________________________________________________ There is a relatively spongiform appearing 1.4 cm thyroid nodule in the isthmus. This thyroid nodule does not meet criteria for follow-up or fine-needle aspiration. IMPRESSION: Markedly heterogeneous and asymmetrically enlarged thyroid gland as detailed above. The lateral neck mass felt on physical exam may be explained by the enlarged left hemi thyroid. The left hemi thyroid is heterogeneous, however a distinct thyroid nodule is difficult to appreciate. The above is in keeping with the ACR TI-RADS recommendations - J Am Coll Radiol 2017;14:587-595. Electronically Signed   By: Constance Holster M.D.   On: 06/07/2020 19:04       Subjective: Patient seen and examined at the bedside this morning.  Hemodynamically stable for discharge.  Discharge Exam: Vitals:   07/05/20 0541 07/05/20  1009  BP: (!) 153/65 (!) 138/51  Pulse: 87 83  Resp: 18 20  Temp: 98.2 F (36.8 C) 97.9 F (36.6 C)  SpO2: 95% 96%   Vitals:   07/04/20 1419 07/04/20 2048 07/05/20 0541 07/05/20 1009  BP: (!) 153/78 (!) 145/59 (!) 153/65 (!) 138/51  Pulse: 91 87 87 83  Resp: 16 17 18 20   Temp:  98.5 F (36.9 C) 98.2 F (36.8 C) 97.9 F (36.6 C)  TempSrc:  Oral Oral Oral  SpO2: 98% 95% 95% 96%  Weight:      Height:        General: Pt is alert, awake, not in acute distress Cardiovascular: RRR, S1/S2 +, no rubs, no gallops Respiratory: CTA bilaterally, no wheezing, no rhonchi Abdominal: Soft, NT, ND, bowel sounds + Extremities: no edema, no cyanosis    The results of significant diagnostics from this hospitalization (including imaging, microbiology, ancillary and laboratory) are listed below for reference.     Microbiology: Recent Results (from the past 240 hour(s))  Urine culture     Status: Abnormal   Collection Time: 07/01/20  1:37 PM   Specimen: Urine, Random  Result Value Ref Range Status   Specimen Description URINE, RANDOM  Final   Special Requests   Final    NONE Performed at [facility], [address], [place], [place] [address]    Culture MULTIPLE SPECIES PRESENT, SUGGEST RECOLLECTION (A)  Final   Report Status 07/02/2020 FINAL  Final  Respiratory Panel by RT PCR (Flu A&B, Covid) - Nasopharyngeal Swab     Status: None   Collection Time: 07/01/20  2:43 PM   Specimen: Nasopharyngeal Swab  Result Value Ref Range Status   SARS Coronavirus 2 by RT PCR NEGATIVE NEGATIVE Final    Comment: (NOTE) SARS-CoV-2 target nucleic acids are NOT DETECTED.  The SARS-CoV-2 RNA is generally detectable in upper respiratoy specimens during the acute phase of infection. The lowest concentration of SARS-CoV-2 viral copies this assay can detect is 131 copies/mL. A negative result does not preclude SARS-Cov-2 infection and should not be used as the sole basis for treatment or other  patient management decisions. A negative result may occur with  improper specimen collection/handling, submission of specimen other than nasopharyngeal swab, presence of viral mutation(s) within the areas targeted by this assay, and inadequate number of viral copies (<131 copies/mL). A negative result must be combined with clinical observations, patient history, and epidemiological information. The expected result is Negative.  Fact Sheet for Patients:  PinkCheek.be  Fact Sheet for Healthcare Providers:  GravelBags.it  This test is no t yet approved or cleared by the [place] FDA and  has been authorized for detection and/or diagnosis of SARS-CoV-2 by FDA under an Emergency Use Authorization (EUA). This EUA will remain  in effect (meaning this test can be used) for the duration of the COVID-19 declaration under Section 564(b)(1) of the Act, 21 U.S.C. section 360bbb-3(b)(1), unless the authorization is terminated or revoked sooner.     Influenza A by PCR NEGATIVE NEGATIVE Final   Influenza B by PCR NEGATIVE NEGATIVE Final    Comment: (NOTE) The Xpert Xpress SARS-CoV-2/FLU/RSV assay is intended as an aid in  the diagnosis of influenza from Nasopharyngeal swab specimens and  should not be used as a sole basis for treatment. Nasal washings and  aspirates are unacceptable for Xpert Xpress SARS-CoV-2/FLU/RSV  testing.  Fact Sheet for Patients: PinkCheek.be  Fact Sheet for Healthcare Providers: GravelBags.it  This test is not yet approved or cleared by the [place] FDA and  has been authorized for detection and/or diagnosis of SARS-CoV-2 by  FDA under an Emergency Use Authorization (EUA). This EUA will remain  in effect (meaning this test can be used) for the duration of the  Covid-19 declaration under Section 564(b)(1) of the Act, 21  U.S.C. section  360bbb-3(b)(1), unless the authorization is  terminated or revoked. Performed at [facility], [address], [place], [place] [address]   SARS Coronavirus 2 by RT PCR (hospital order, performed in [facility] hospital lab) Nasopharyngeal Nasopharyngeal Swab     Status: None   Collection Time: 07/04/20  3:45 PM   Specimen: Nasopharyngeal Swab  Result Value Ref Range Status   SARS Coronavirus 2 NEGATIVE NEGATIVE Final    Comment: (NOTE) SARS-CoV-2 target nucleic acids are NOT DETECTED.  The SARS-CoV-2 RNA is generally detectable in upper and lower respiratory specimens during the acute phase of infection. The lowest concentration of SARS-CoV-2 viral copies this assay can detect is 250 copies / mL. A negative result does not preclude SARS-CoV-2 infection and should not be used as the sole basis for treatment or other patient management decisions.  A negative result may occur with improper specimen collection / handling, submission of specimen other than nasopharyngeal swab, presence of viral mutation(s) within the areas targeted by this assay, and inadequate number of viral copies (<250 copies / mL). A negative result must be combined with  clinical observations, patient history, and epidemiological information.  Fact Sheet for Patients:   StrictlyIdeas.no  Fact Sheet for Healthcare Providers: BankingDealers.co.za  This test is not yet approved or  cleared by the [place] FDA and has been authorized for detection and/or diagnosis of SARS-CoV-2 by FDA under an Emergency Use Authorization (EUA).  This EUA will remain in effect (meaning this test can be used) for the duration of the COVID-19 declaration under Section 564(b)(1) of the Act, 21 U.S.C. section 360bbb-3(b)(1), unless the authorization is terminated or revoked sooner.  Performed at [facility], [place] [address] [place], [place] [address]      Labs: BNP  (last 3 results) Recent Labs    05/11/20 2257 07/01/20 1300  BNP 637.9* 6,712.4*   Basic Metabolic Panel: Recent Labs  Lab 07/01/20 1115 07/02/20 0413 07/02/20 0838 07/03/20 0154  NA 139 138  --  137  K 3.9 3.0*  --  4.1  CL 99 95*  --  97*  CO2 27 29  --  30  GLUCOSE 168* 123*  --  132*  BUN 22 15  --  15  CREATININE 0.59 0.57  --  0.65  CALCIUM 8.1* 7.2*  --  7.2*  MG  --   --  1.9  --    Liver Function Tests: Recent Labs  Lab 07/01/20 1115  AST 38  ALT 31  ALKPHOS 68  BILITOT 2.4*  PROT 6.2*  ALBUMIN 3.5   No results for input(s): LIPASE, AMYLASE in the last 168 hours. No results for input(s): AMMONIA in the last 168 hours. CBC: Recent Labs  Lab 07/01/20 1115 07/02/20 0413 07/03/20 0154 07/04/20 0044 07/05/20 0331  WBC 14.0* 16.1* 14.6* 16.3* 18.9*  NEUTROABS 12.8*  --   --  13.8* 17.2*  HGB 11.3* 9.2* 9.7* 9.5* 9.9*  HCT 37.2 29.7* 31.6* 31.3* 32.8*  MCV 91.0 89.2 89.3 89.9 90.6  PLT 233 194 219 199 188   Cardiac Enzymes: No results for input(s): CKTOTAL, CKMB, CKMBINDEX, TROPONINI in the last 168 hours. BNP: Invalid input(s): POCBNP CBG: No results for input(s): GLUCAP in the last 168 hours. D-Dimer No results for input(s): DDIMER in the last 72 hours. Hgb A1c No results for input(s): HGBA1C in the last 72 hours. Lipid Profile No results for input(s): CHOL, HDL, LDLCALC, TRIG, CHOLHDL, LDLDIRECT in the last 72 hours. Thyroid function studies No results for input(s): TSH, T4TOTAL, T3FREE, THYROIDAB in the last 72 hours.  Invalid input(s): FREET3 Anemia work up No results for input(s): VITAMINB12, FOLATE, FERRITIN, TIBC, IRON, RETICCTPCT in the last 72 hours. Urinalysis    Component Value Date/Time   COLORURINE AMBER (A) 07/01/2020 1335   APPEARANCEUR HAZY (A) 07/01/2020 1335   LABSPEC 1.017 07/01/2020 1335   PHURINE 6.0 07/01/2020 1335   GLUCOSEU NEGATIVE 07/01/2020 1335   HGBUR SMALL (A) 07/01/2020 1335   BILIRUBINUR NEGATIVE  07/01/2020 1335   KETONESUR 20 (A) 07/01/2020 1335   PROTEINUR 30 (A) 07/01/2020 1335   NITRITE NEGATIVE 07/01/2020 1335   LEUKOCYTESUR NEGATIVE 07/01/2020 1335   Sepsis Labs Invalid input(s): PROCALCITONIN,  WBC,  LACTICIDVEN Microbiology Recent Results (from the past 240 hour(s))  Urine culture     Status: Abnormal   Collection Time: 07/01/20  1:37 PM   Specimen: Urine, Random  Result Value Ref Range Status   Specimen Description URINE, RANDOM  Final   Special Requests   Final    NONE Performed at [facility], [place]  [address], [place], [place] [address]    Culture MULTIPLE SPECIES PRESENT, SUGGEST RECOLLECTION (A)  Final   Report Status 07/02/2020 FINAL  Final  Respiratory Panel by RT PCR (Flu A&B, Covid) - Nasopharyngeal Swab     Status: None   Collection Time: 07/01/20  2:43 PM   Specimen: Nasopharyngeal Swab  Result Value Ref Range Status   SARS Coronavirus 2 by RT PCR NEGATIVE NEGATIVE Final    Comment: (NOTE) SARS-CoV-2 target nucleic acids are NOT DETECTED.  The SARS-CoV-2 RNA is generally detectable in upper respiratoy specimens during the acute phase of infection. The lowest concentration of SARS-CoV-2 viral copies this assay can detect is 131 copies/mL. A negative result does not preclude SARS-Cov-2 infection and should not be used as the sole basis for treatment or other patient management decisions. A negative result may occur with  improper specimen collection/handling, submission of specimen other than nasopharyngeal swab, presence of viral mutation(s) within the areas targeted by this assay, and inadequate number of viral copies (<131 copies/mL). A negative result must be combined with clinical observations, patient history, and epidemiological information. The expected result is Negative.  Fact Sheet for Patients:  PinkCheek.be  Fact Sheet for Healthcare Providers:  GravelBags.it  This test  is no t yet approved or cleared by the [place] FDA and  has been authorized for detection and/or diagnosis of SARS-CoV-2 by FDA under an Emergency Use Authorization (EUA). This EUA will remain  in effect (meaning this test can be used) for the duration of the COVID-19 declaration under Section 564(b)(1) of the Act, 21 U.S.C. section 360bbb-3(b)(1), unless the authorization is terminated or revoked sooner.     Influenza A by PCR NEGATIVE NEGATIVE Final   Influenza B by PCR NEGATIVE NEGATIVE Final    Comment: (NOTE) The Xpert Xpress SARS-CoV-2/FLU/RSV assay is intended as an aid in  the diagnosis of influenza from Nasopharyngeal swab specimens and  should not be used as a sole basis for treatment. Nasal washings and  aspirates are unacceptable for Xpert Xpress SARS-CoV-2/FLU/RSV  testing.  Fact Sheet for Patients: PinkCheek.be  Fact Sheet for Healthcare Providers: GravelBags.it  This test is not yet approved or cleared by the [place] FDA and  has been authorized for detection and/or diagnosis of SARS-CoV-2 by  FDA under an Emergency Use Authorization (EUA). This EUA will remain  in effect (meaning this test can be used) for the duration of the  Covid-19 declaration under Section 564(b)(1) of the Act, 21  U.S.C. section 360bbb-3(b)(1), unless the authorization is  terminated or revoked. Performed at [facility], [place] [address], [place], [place] [address]   SARS Coronavirus 2 by RT PCR (hospital order, performed in [facility] hospital lab) Nasopharyngeal Nasopharyngeal Swab     Status: None   Collection Time: 07/04/20  3:45 PM   Specimen: Nasopharyngeal Swab  Result Value Ref Range Status   SARS Coronavirus 2 NEGATIVE NEGATIVE Final    Comment: (NOTE) SARS-CoV-2 target nucleic acids are NOT DETECTED.  The SARS-CoV-2 RNA is generally detectable in upper and lower respiratory specimens during the acute  phase of infection. The lowest concentration of SARS-CoV-2 viral copies this assay can detect is 250 copies / mL. A negative result does not preclude SARS-CoV-2 infection and should not be used as the sole basis for treatment or other patient management decisions.  A negative result may occur with improper specimen collection / handling, submission of specimen other than nasopharyngeal swab, presence of viral mutation(s)  within the areas targeted by this assay, and inadequate number of viral copies (<250 copies / mL). A negative result must be combined with clinical observations, patient history, and epidemiological information.  Fact Sheet for Patients:   StrictlyIdeas.no  Fact Sheet for Healthcare Providers: BankingDealers.co.za  This test is not yet approved or  cleared by the [place] FDA and has been authorized for detection and/or diagnosis of SARS-CoV-2 by FDA under an Emergency Use Authorization (EUA).  This EUA will remain in effect (meaning this test can be used) for the duration of the COVID-19 declaration under Section 564(b)(1) of the Act, 21 U.S.C. section 360bbb-3(b)(1), unless the authorization is terminated or revoked sooner.  Performed at [facility], [place] [address], [place], [place] [address]     Please note: You were cared for by a hospitalist during your hospital stay. Once you are discharged, your primary care physician will handle any further medical issues. Please note that NO REFILLS for any discharge medications will be authorized once you are discharged, as it is imperative that you return to your primary care physician (or establish a relationship with a primary care physician if you do not have one) for your post hospital discharge needs so that they can reassess your need for medications and monitor your lab values.    Time coordinating discharge: 40 minutes  SIGNED:   Shelly Coss,  MD  [facility] 07/05/2020, 11:17 AM Pager [phone]  If 7PM-7AM, please contact night-coverage [email or web address] Password [email or web address]

## 2020-07-05 NOTE — Plan of Care (Signed)
  Problem: Health Behavior/Discharge Planning: Goal: Ability to manage health-related needs will improve Outcome: Not Progressing   Problem: Clinical Measurements: Goal: Ability to maintain clinical measurements within normal limits will improve Outcome: Not Progressing Goal: Will remain free from infection Outcome: Not Progressing Goal: Diagnostic test results will improve Outcome: Not Progressing Goal: Respiratory complications will improve Outcome: Not Progressing Goal: Cardiovascular complication will be avoided Outcome: Not Progressing   Problem: Activity: Goal: Risk for activity intolerance will decrease Outcome: Not Progressing   Problem: Nutrition: Goal: Adequate nutrition will be maintained Outcome: Not Progressing   Problem: Coping: Goal: Level of anxiety will decrease Outcome: Not Progressing   Problem: Elimination: Goal: Will not experience complications related to bowel motility Outcome: Not Progressing Goal: Will not experience complications related to urinary retention Outcome: Not Progressing

## 2020-07-05 NOTE — Plan of Care (Signed)
  Problem: Education: Goal: Knowledge of General Education information will improve Description: Including pain rating scale, medication(s)/side effects and non-pharmacologic comfort measures Outcome: Progressing   Problem: Clinical Measurements: Goal: Ability to maintain clinical measurements within normal limits will improve Outcome: Progressing Goal: Will remain free from infection Outcome: Progressing Goal: Diagnostic test results will improve Outcome: Progressing Goal: Respiratory complications will improve Outcome: Progressing Goal: Cardiovascular complication will be avoided Outcome: Progressing   Problem: Activity: Goal: Risk for activity intolerance will decrease Outcome: Progressing   Problem: Coping: Goal: Level of anxiety will decrease Outcome: Progressing   Problem: Elimination: Goal: Will not experience complications related to bowel motility Outcome: Progressing Goal: Will not experience complications related to urinary retention Outcome: Progressing   Problem: Pain Managment: Goal: General experience of comfort will improve Outcome: Progressing   Problem: Safety: Goal: Ability to remain free from injury will improve Outcome: Progressing   Problem: Skin Integrity: Goal: Risk for impaired skin integrity will decrease Outcome: Progressing   

## 2020-07-05 NOTE — Care Management Important Message (Signed)
Important Message  Patient Details  Name: Dawn Hampton MRN: [id number] Date of Birth: 05-07-1922   Medicare Important Message Given:  Yes     [name] 07/05/2020, 1:59 PM

## 2020-07-05 NOTE — Discharge Planning (Signed)
Attempted to call and give report to receiving facility. [facility] will call back for report when nurse available.

## 2020-07-05 NOTE — TOC Transition Note (Signed)
Transition of Care [facility]) - CM/SW Discharge Note   Patient Details  Name: Dawn Hampton MRN: [id number] Date of Birth: Jun 26, 1922  [facility] CM/SW Contact:  Benard Halsted, [place] Phone Number: 07/05/2020, 4:01 PM   Clinical Narrative:    1pm-Per [facility], they are unable to provide Pudding-thick thickener  (their vendor cannot order) and are not able to accept patient.   2pm-CSW spoke with patient and her son at bedside and provided other SNF offers. They selected [facility]. CSW confirmed that [facility] is able to accept patient today. Patient's son to take patient there at 4pm and help with her paperwork. Signed script and DNR given to son to give to [facility]. No other needs identified. Report number given to RN.    Final next level of care: Skilled Nursing Facility Barriers to Discharge: Barriers Resolved   Patient Goals and CMS Choice Patient states their goals for this hospitalization and ongoing recovery are:: "get back home" CMS Medicare.gov Compare Post Acute Care list provided to:: Patient Choice offered to / list presented to : Patient  Discharge Placement   Existing PASRR number confirmed : 07/05/20          Patient chooses bed at: [facility] Patient to be transferred to facility by: [place] Name of family member notified: Son at bedside Patient and family notified of of transfer: 07/05/20  Discharge Plan and Services     Post Acute Care Choice: [facility]                               Social Determinants of Health (SDOH) Interventions     Readmission Risk Interventions No flowsheet data found.

## 2020-07-10 DIAGNOSIS — I4891 Unspecified atrial fibrillation: Secondary | ICD-10-CM | POA: Diagnosis not present

## 2020-07-10 DIAGNOSIS — S1093XA Contusion of unspecified part of neck, initial encounter: Secondary | ICD-10-CM | POA: Diagnosis not present

## 2020-07-10 DIAGNOSIS — E0590 Thyrotoxicosis, unspecified without thyrotoxic crisis or storm: Secondary | ICD-10-CM | POA: Diagnosis not present

## 2020-07-29 ENCOUNTER — Encounter (INDEPENDENT_AMBULATORY_CARE_PROVIDER_SITE_OTHER): Payer: Medicare Other | Admitting: Ophthalmology

## 2020-07-31 DEATH — deceased

## 2020-10-02 ENCOUNTER — Ambulatory Visit: Payer: PRIVATE HEALTH INSURANCE | Admitting: Cardiology

## 2022-01-22 IMAGING — CT CT NECK W/ CM
3 of 5 series · 12 of 33 positions shown, 14 images · IV contrast (Omni 300)
Comparison: Thyroid ultrasound 06/24/2020

CLINICAL DATA: Left neck mass. Mass came up suddenly after carotid
ultrasound. Patient on Eliquis. Diffuse bruising in the left neck.

EXAM:
CT NECK WITH CONTRAST
TECHNIQUE: Multidetector CT imaging of the neck was performed using the
standard protocol following the bolus administration of intravenous
contrast.
CONTRAST:  75mL OMNIPAQUE IOHEXOL 300 MG/ML  SOLN

[Series 5: sagittal · sagittal · 0.41mm/px · 5 of 101 slices shown, 6 images]
[im 34/101  bone]
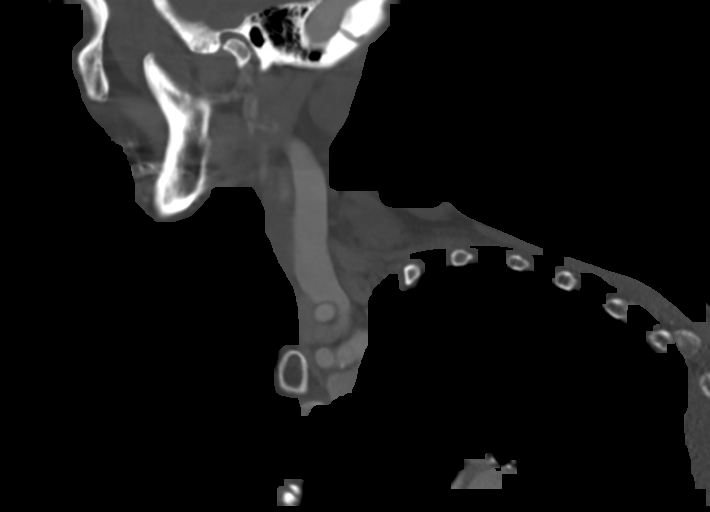
[im 42/101  bone]
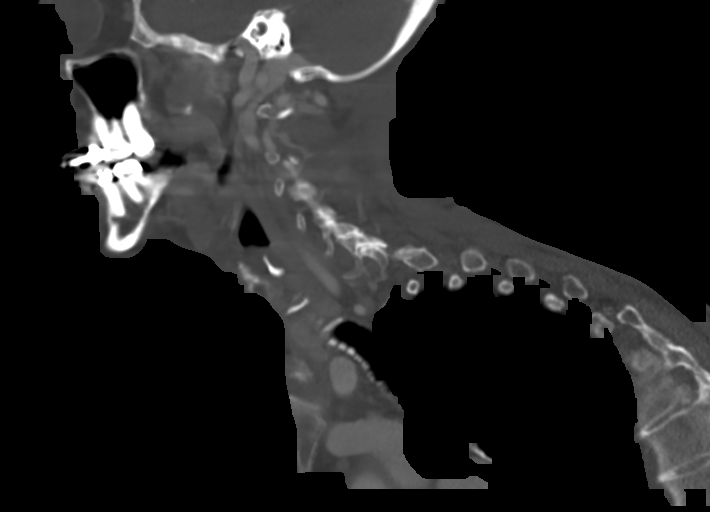
[im 51/101  soft-tissue]
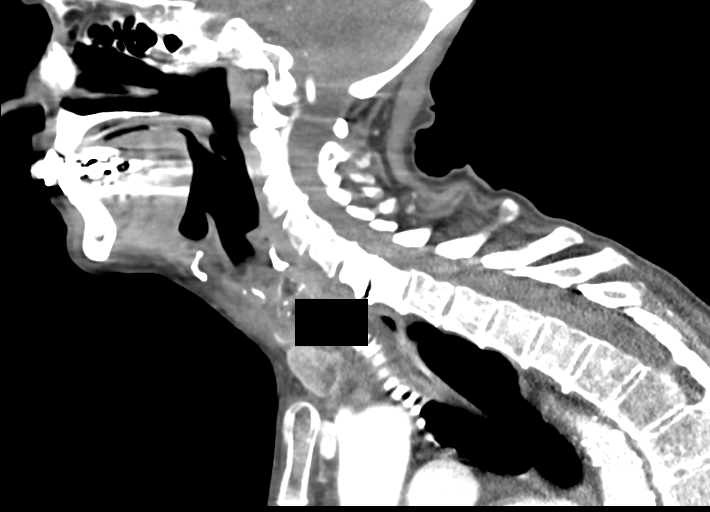
[im 51/101  bone]
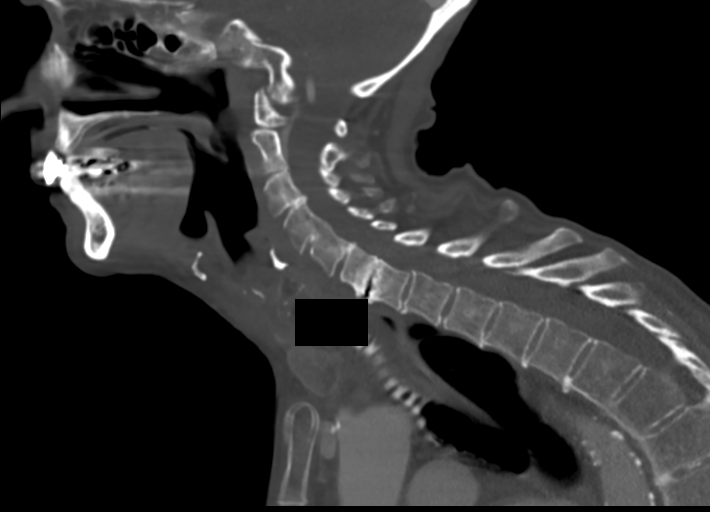
[im 59/101  bone]
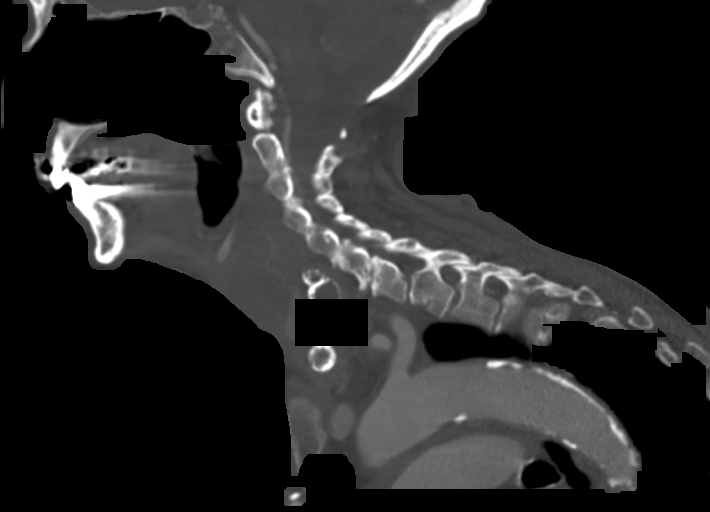
[im 67/101  bone]
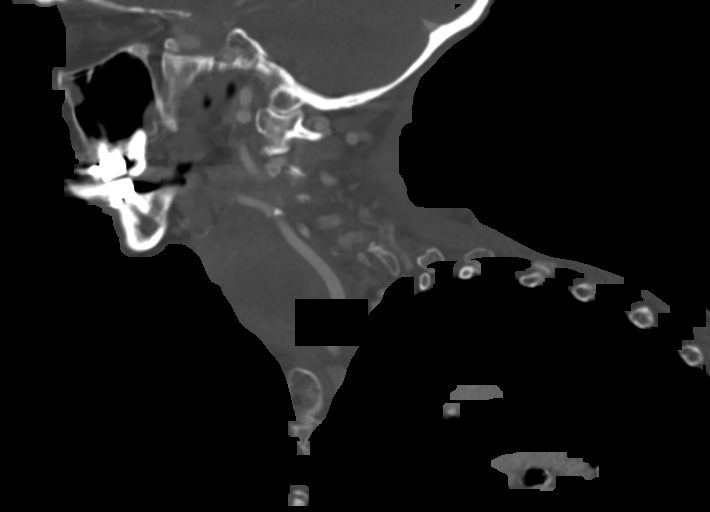

[Series 6: coronal · coronal · 0.46mm/px · 3 of 101 slices shown]
[im 35/101  bone]
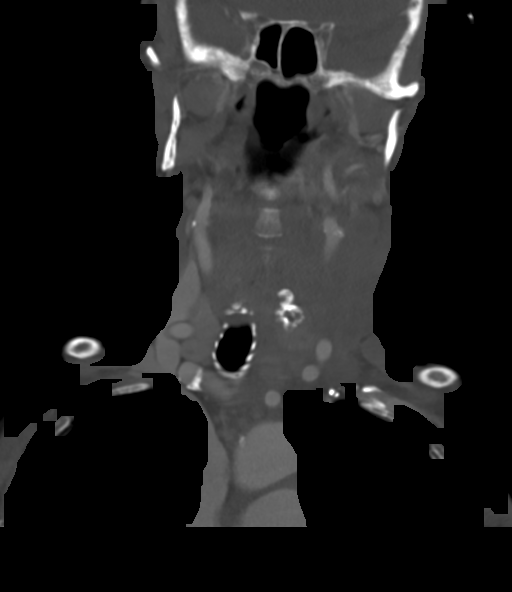
[im 45/101  bone]
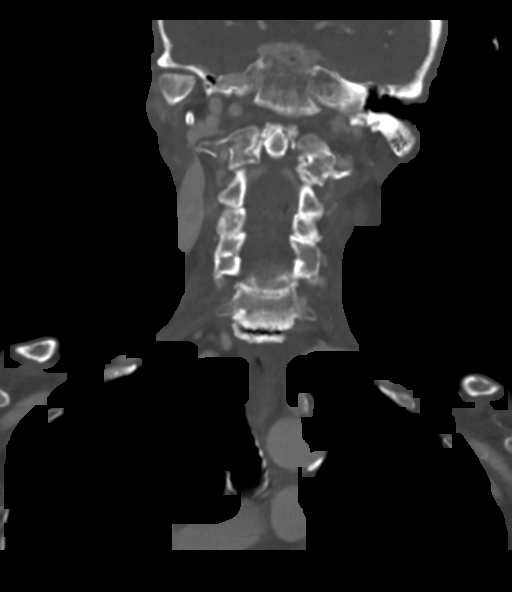
[im 56/101  bone]
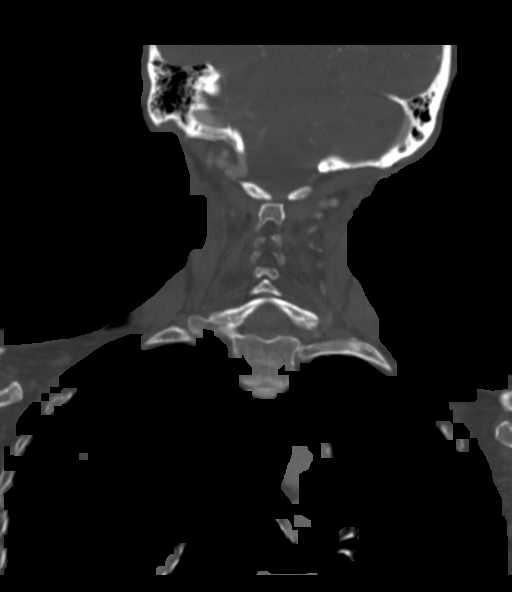

[Series 7: orthogonal · axial · 0.39mm/px · z∈[-364,-229]mm · 4 of 122 slices shown, 5 images]
[im 25/122  soft-tissue]
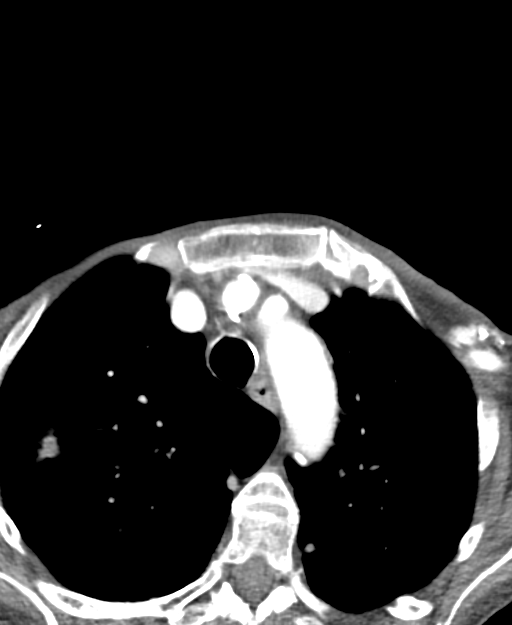
[im 25/122  bone]
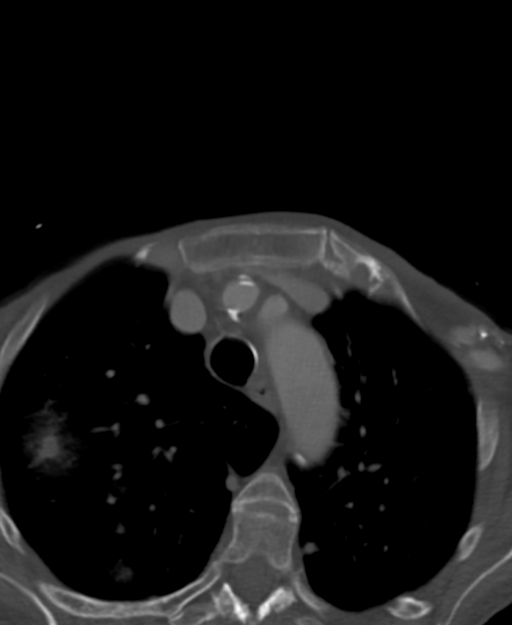
[im 49/122  bone]
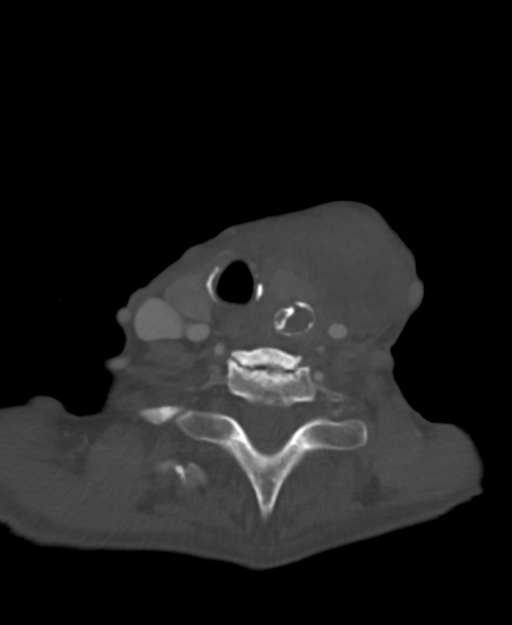
[im 73/122  bone]
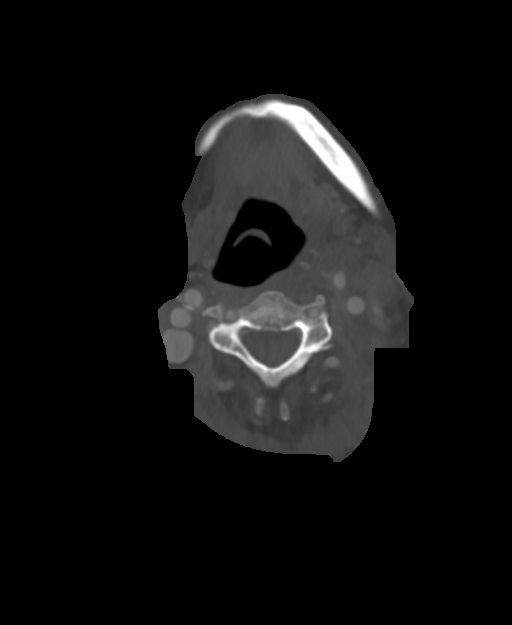
[im 97/122  bone]
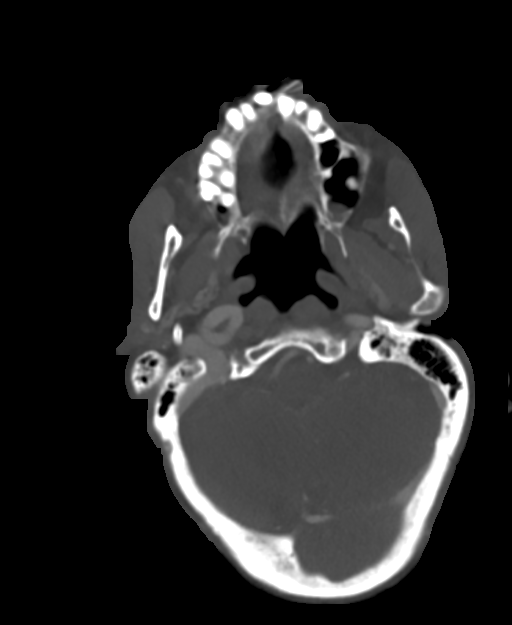

[12 of 33 positions shown; findings below may reference images not displayed]

FINDINGS: Pharynx and larynx: Larynx is mildly displaced to the right due to
the large left neck mass. Airway is intact. No pharyngeal mass.
There is external effacement of the left piriform sinus due to mass.

Salivary glands: No inflammation, mass, or stone.

Thyroid: Enhancing thyroid tissue is heterogeneous. Thyroid is not
enlarged. Calcified mass posterior to the left thyroid measures 14 x
19 mm. 12 mm calcified mass in the left isthmus. Adjacent to this
there is a soft tissue nodule in the isthmus measuring 29 x 22 mm

Lymph nodes: 8 mm supraclavicular lymph node on the left.

Large left neck mass. At first glance this appears to be a lymph
node mass however given history, this may represent hematoma. The
mass measures approximately 5.5 x 6 cm and is anterior and above the
left thyroid. The mass appears to be engulfing some of the left
thyroid tissue. Mass has heterogeneous density without definite
enhancement.

Vascular: Left jugular vein is occluded likely due to compression by
the mass. Right jugular vein shows normal enhancement. Both carotid
arteries are patent.

Limited intracranial: Negative

Visualized orbits: No orbital mass.  Bilateral cataract extraction

Mastoids and visualized paranasal sinuses: Mild mucosal edema
paranasal sinuses. Mastoid clear bilaterally.

Skeleton: Cervical spondylosis without acute skeletal abnormality.

Upper chest: Numerous infiltrates in the upper lobes bilaterally.
These are patchy ill-defined and peripheral and are suspicious for
COVID pneumonia.

Other: None
IMPRESSION: 1. Large mass left neck measuring 5.5 x 6 cm. By history, this came
up suddenly and is associated with ecchymosis. Patient is on
anticoagulation. Therefore this is most likely hematoma. Underlying
neoplasm which bled is possible therefore follow-up imaging is
recommended after the hematoma has resolved.
2. Multiple thyroid nodules as above. The nodule in the isthmus may
need to be biopsied after the hematoma resolves.
3. Bilateral lung infiltrates suspicious for COVID pneumonia.
4. Left jugular vein is not patent and may be occluded from
extrinsic compression by the mass.
5. These results were called by telephone at the time of
interpretation on 06/24/2020 at [DATE] to provider TR MICKENS
, who verbally acknowledged these results.
